# Patient Record
Sex: Male | Born: 1935 | Race: White | Hispanic: No | Marital: Married | Smoking: Former smoker
Health system: Southern US, Community
[De-identification: ages and names within clinical notes are randomized; demographics above are authoritative.]

## PROBLEM LIST (undated history)

## (undated) DIAGNOSIS — G629 Polyneuropathy, unspecified: Secondary | ICD-10-CM

## (undated) DIAGNOSIS — I2510 Atherosclerotic heart disease of native coronary artery without angina pectoris: Secondary | ICD-10-CM

## (undated) DIAGNOSIS — Z9289 Personal history of other medical treatment: Secondary | ICD-10-CM

## (undated) DIAGNOSIS — E785 Hyperlipidemia, unspecified: Secondary | ICD-10-CM

## (undated) DIAGNOSIS — I6529 Occlusion and stenosis of unspecified carotid artery: Secondary | ICD-10-CM

## (undated) DIAGNOSIS — I10 Essential (primary) hypertension: Secondary | ICD-10-CM

## (undated) DIAGNOSIS — E119 Type 2 diabetes mellitus without complications: Secondary | ICD-10-CM

## (undated) HISTORY — DX: Hyperlipidemia, unspecified: E78.5

## (undated) HISTORY — DX: Polyneuropathy, unspecified: G62.9

## (undated) HISTORY — DX: Atherosclerotic heart disease of native coronary artery without angina pectoris: I25.10

## (undated) HISTORY — DX: Type 2 diabetes mellitus without complications: E11.9

## (undated) HISTORY — DX: Essential (primary) hypertension: I10

## (undated) HISTORY — DX: Occlusion and stenosis of unspecified carotid artery: I65.29

## (undated) HISTORY — DX: Personal history of other medical treatment: Z92.89

## (undated) HISTORY — PX: TONSILLECTOMY: SUR1361

---

## 1999-03-25 ENCOUNTER — Ambulatory Visit: Admission: RE | Admit: 1999-03-25 | Discharge: 1999-03-25 | Payer: Self-pay | Admitting: Endocrinology

## 1999-06-30 ENCOUNTER — Encounter: Payer: Self-pay | Admitting: Endocrinology

## 1999-06-30 ENCOUNTER — Emergency Department (HOSPITAL_COMMUNITY): Admission: EM | Admit: 1999-06-30 | Discharge: 1999-06-30 | Payer: Self-pay | Admitting: Emergency Medicine

## 1999-07-05 ENCOUNTER — Encounter: Payer: Self-pay | Admitting: Urology

## 1999-07-05 ENCOUNTER — Ambulatory Visit (HOSPITAL_COMMUNITY): Admission: RE | Admit: 1999-07-05 | Discharge: 1999-07-05 | Payer: Self-pay | Admitting: Urology

## 2005-04-25 ENCOUNTER — Emergency Department (HOSPITAL_COMMUNITY): Admission: EM | Admit: 2005-04-25 | Discharge: 2005-04-25 | Payer: Self-pay | Admitting: Family Medicine

## 2005-04-25 ENCOUNTER — Ambulatory Visit (HOSPITAL_COMMUNITY): Admission: RE | Admit: 2005-04-25 | Discharge: 2005-04-25 | Payer: Self-pay | Admitting: Family Medicine

## 2007-09-03 ENCOUNTER — Inpatient Hospital Stay (HOSPITAL_COMMUNITY): Admission: EM | Admit: 2007-09-03 | Discharge: 2007-09-04 | Payer: Self-pay | Admitting: Emergency Medicine

## 2007-09-03 ENCOUNTER — Ambulatory Visit: Payer: Self-pay | Admitting: *Deleted

## 2007-09-09 ENCOUNTER — Ambulatory Visit: Payer: Self-pay

## 2007-09-13 ENCOUNTER — Ambulatory Visit: Payer: Self-pay | Admitting: Cardiology

## 2007-09-13 DIAGNOSIS — R911 Solitary pulmonary nodule: Secondary | ICD-10-CM

## 2007-09-13 DIAGNOSIS — J984 Other disorders of lung: Secondary | ICD-10-CM

## 2007-09-13 HISTORY — DX: Solitary pulmonary nodule: R91.1

## 2007-09-15 ENCOUNTER — Ambulatory Visit: Payer: Self-pay | Admitting: Cardiology

## 2007-09-15 LAB — CONVERTED CEMR LAB
Basophils Relative: 1.1 % — ABNORMAL HIGH (ref 0.0–1.0)
Chloride: 105 meq/L (ref 96–112)
Eosinophils Relative: 1.6 % (ref 0.0–5.0)
GFR calc Af Amer: 95 mL/min
Glucose, Bld: 101 mg/dL — ABNORMAL HIGH (ref 70–99)
MCHC: 34 g/dL (ref 30.0–36.0)
Monocytes Relative: 11.7 % — ABNORMAL HIGH (ref 3.0–11.0)
Neutro Abs: 2.4 10*3/uL (ref 1.4–7.7)
Potassium: 3.9 meq/L (ref 3.5–5.1)
RDW: 12.7 % (ref 11.5–14.6)
WBC: 4.3 10*3/uL — ABNORMAL LOW (ref 4.5–10.5)
aPTT: 27.9 s (ref 21.7–29.8)

## 2007-09-20 ENCOUNTER — Inpatient Hospital Stay (HOSPITAL_COMMUNITY): Admission: EM | Admit: 2007-09-20 | Discharge: 2007-09-21 | Payer: Self-pay | Admitting: Cardiology

## 2007-09-20 ENCOUNTER — Ambulatory Visit: Payer: Self-pay | Admitting: Cardiology

## 2007-09-20 ENCOUNTER — Inpatient Hospital Stay (HOSPITAL_BASED_OUTPATIENT_CLINIC_OR_DEPARTMENT_OTHER): Admission: RE | Admit: 2007-09-20 | Discharge: 2007-09-20 | Payer: Self-pay | Admitting: Cardiology

## 2007-10-01 ENCOUNTER — Ambulatory Visit: Payer: Self-pay | Admitting: Cardiology

## 2007-10-01 LAB — CONVERTED CEMR LAB
BUN: 17 mg/dL (ref 6–23)
Basophils Absolute: 0 10*3/uL (ref 0.0–0.1)
CO2: 25 meq/L (ref 19–32)
Calcium: 9.2 mg/dL (ref 8.4–10.5)
Creatinine, Ser: 1.1 mg/dL (ref 0.4–1.5)
GFR calc Af Amer: 85 mL/min
HCT: 39.7 % (ref 39.0–52.0)
Hemoglobin: 13.2 g/dL (ref 13.0–17.0)
Lymphocytes Relative: 25.6 % (ref 12.0–46.0)
MCV: 96 fL (ref 78.0–100.0)
Monocytes Relative: 10.6 % (ref 3.0–12.0)
Neutro Abs: 2.3 10*3/uL (ref 1.4–7.7)
Potassium: 3.9 meq/L (ref 3.5–5.1)
RBC: 4.13 M/uL — ABNORMAL LOW (ref 4.22–5.81)
RDW: 13.1 % (ref 11.5–14.6)

## 2007-10-06 DIAGNOSIS — I10 Essential (primary) hypertension: Secondary | ICD-10-CM

## 2007-10-06 DIAGNOSIS — I219 Acute myocardial infarction, unspecified: Secondary | ICD-10-CM | POA: Insufficient documentation

## 2007-10-06 HISTORY — DX: Acute myocardial infarction, unspecified: I21.9

## 2007-10-07 ENCOUNTER — Ambulatory Visit: Payer: Self-pay | Admitting: Internal Medicine

## 2007-10-07 ENCOUNTER — Encounter (HOSPITAL_COMMUNITY): Admission: RE | Admit: 2007-10-07 | Discharge: 2008-01-05 | Payer: Self-pay | Admitting: Cardiology

## 2007-11-18 ENCOUNTER — Ambulatory Visit: Payer: Self-pay | Admitting: Cardiology

## 2007-11-18 LAB — CONVERTED CEMR LAB
BUN: 23 mg/dL (ref 6–23)
CK-MB: 2.7 ng/mL (ref 0.3–4.0)
Eosinophils Absolute: 0.1 10*3/uL (ref 0.0–0.7)
Eosinophils Relative: 1.9 % (ref 0.0–5.0)
GFR calc Af Amer: 95 mL/min
Glucose, Bld: 112 mg/dL — ABNORMAL HIGH (ref 70–99)
Lymphocytes Relative: 25.6 % (ref 12.0–46.0)
Magnesium: 2 mg/dL (ref 1.5–2.5)
Monocytes Relative: 11.1 % (ref 3.0–12.0)
Neutro Abs: 2.6 10*3/uL (ref 1.4–7.7)
Neutrophils Relative %: 61 % (ref 43.0–77.0)

## 2007-11-25 ENCOUNTER — Ambulatory Visit: Payer: Self-pay

## 2007-11-25 ENCOUNTER — Encounter: Payer: Self-pay | Admitting: Internal Medicine

## 2007-12-01 ENCOUNTER — Ambulatory Visit: Payer: Self-pay | Admitting: Cardiology

## 2008-02-23 ENCOUNTER — Ambulatory Visit: Payer: Self-pay | Admitting: Cardiology

## 2008-03-06 ENCOUNTER — Telehealth: Payer: Self-pay | Admitting: Internal Medicine

## 2008-03-06 ENCOUNTER — Ambulatory Visit: Payer: Self-pay | Admitting: Cardiology

## 2008-08-24 ENCOUNTER — Ambulatory Visit: Payer: Self-pay | Admitting: Cardiology

## 2008-08-24 DIAGNOSIS — I10 Essential (primary) hypertension: Secondary | ICD-10-CM | POA: Insufficient documentation

## 2008-08-24 DIAGNOSIS — I2510 Atherosclerotic heart disease of native coronary artery without angina pectoris: Secondary | ICD-10-CM

## 2008-08-24 DIAGNOSIS — E782 Mixed hyperlipidemia: Secondary | ICD-10-CM

## 2008-08-24 DIAGNOSIS — I495 Sick sinus syndrome: Secondary | ICD-10-CM

## 2008-08-24 DIAGNOSIS — E785 Hyperlipidemia, unspecified: Secondary | ICD-10-CM | POA: Insufficient documentation

## 2008-08-24 HISTORY — DX: Sick sinus syndrome: I49.5

## 2008-08-24 HISTORY — DX: Essential (primary) hypertension: I10

## 2008-08-24 HISTORY — DX: Mixed hyperlipidemia: E78.2

## 2008-08-24 HISTORY — DX: Atherosclerotic heart disease of native coronary artery without angina pectoris: I25.10

## 2008-12-20 ENCOUNTER — Ambulatory Visit: Payer: Self-pay | Admitting: Cardiology

## 2008-12-28 ENCOUNTER — Telehealth: Payer: Self-pay | Admitting: Internal Medicine

## 2009-08-20 ENCOUNTER — Ambulatory Visit: Payer: Self-pay | Admitting: Cardiology

## 2009-08-21 ENCOUNTER — Telehealth (INDEPENDENT_AMBULATORY_CARE_PROVIDER_SITE_OTHER): Payer: Self-pay | Admitting: *Deleted

## 2009-09-03 ENCOUNTER — Telehealth: Payer: Self-pay | Admitting: Internal Medicine

## 2009-09-05 ENCOUNTER — Encounter: Admission: RE | Admit: 2009-09-05 | Discharge: 2009-09-05 | Payer: Self-pay | Admitting: Orthopedic Surgery

## 2009-09-07 ENCOUNTER — Ambulatory Visit: Payer: Self-pay | Admitting: Cardiology

## 2009-09-14 ENCOUNTER — Telehealth (INDEPENDENT_AMBULATORY_CARE_PROVIDER_SITE_OTHER): Payer: Self-pay | Admitting: *Deleted

## 2009-09-27 ENCOUNTER — Ambulatory Visit: Payer: Self-pay | Admitting: Internal Medicine

## 2009-09-27 DIAGNOSIS — R0683 Snoring: Secondary | ICD-10-CM

## 2009-09-27 DIAGNOSIS — R0989 Other specified symptoms and signs involving the circulatory and respiratory systems: Secondary | ICD-10-CM

## 2009-09-27 DIAGNOSIS — R0609 Other forms of dyspnea: Secondary | ICD-10-CM

## 2009-09-27 HISTORY — DX: Snoring: R06.83

## 2009-12-04 ENCOUNTER — Encounter: Admission: RE | Admit: 2009-12-04 | Discharge: 2009-12-04 | Payer: Self-pay | Admitting: Endocrinology

## 2010-07-29 ENCOUNTER — Encounter: Payer: Self-pay | Admitting: Internal Medicine

## 2010-08-08 ENCOUNTER — Encounter: Payer: Self-pay | Admitting: Cardiovascular Disease

## 2010-08-08 ENCOUNTER — Ambulatory Visit (INDEPENDENT_AMBULATORY_CARE_PROVIDER_SITE_OTHER): Payer: BC Managed Care – PPO | Admitting: Cardiovascular Disease

## 2010-08-08 ENCOUNTER — Ambulatory Visit: Admit: 2010-08-08 | Payer: Self-pay | Admitting: Cardiovascular Disease

## 2010-08-08 DIAGNOSIS — I10 Essential (primary) hypertension: Secondary | ICD-10-CM

## 2010-08-08 DIAGNOSIS — R5383 Other fatigue: Secondary | ICD-10-CM | POA: Insufficient documentation

## 2010-08-08 DIAGNOSIS — I2510 Atherosclerotic heart disease of native coronary artery without angina pectoris: Secondary | ICD-10-CM

## 2010-08-08 DIAGNOSIS — R5381 Other malaise: Secondary | ICD-10-CM | POA: Insufficient documentation

## 2010-08-08 HISTORY — DX: Other fatigue: R53.83

## 2010-08-08 NOTE — Progress Notes (Signed)
Summary: CT this week  Phone Note Call from Patient Call back at Home Phone [phone]   Caller: Patient Call For: Christianne Zacher Summary of Call: pt states that he has a CT scheduled for this fri. wants to make sure that someone has gotten this pre approved before fri.  Initial call taken by: Tivis Ringer, CNA,  September 03, 2009 3:52 PM  Follow-up for Phone Call        according to [facility] note not pre auth needed. Pt aware.Carron Curie CMA  September 03, 2009 4:27 PM     -

## 2010-08-08 NOTE — Progress Notes (Signed)
Summary: results  Phone Note Call from Patient Call back at Home Phone [phone]   Caller: Patient Call For: ramaswamy Reason for Call: Talk to Nurse Summary of Call: pt looking forward to a call for CT results. Initial call taken by: Eugene Gavia,  September 14, 2009 9:50 AM  Follow-up for Phone Call        please advise on results. thanks. Carron Curie CMA  September 14, 2009 11:37 AM   Additional Follow-up for Phone Call Additional follow up Details #1::        The plan was he come and see me. I have not seen him in 2 years but agreed that would follow the surveillance scan and update results over phone. the understanding was that he come after this final current CT scan. The nodule is unchanged and does not need followup but he absolutely needs to come for a followup and wrap things up Additional Follow-up by: Kalman Shan MD,  September 16, 2009 10:39 PM    Additional Follow-up for Phone Call Additional follow up Details #2::    LMTCB. Carron Curie CMA  September 17, 2009 11:25 AM  Spoke with pt and sched him appt with MR for 09/27/09 at 10:40 to discuss ct results.  Pt aware nodule unchanged. Follow-up by: Vernie Murders,  September 17, 2009 11:35 AM

## 2010-08-08 NOTE — Assessment & Plan Note (Signed)
Summary: F1Y  Medications Added METFORMIN HCL 500 MG  TB24 (METFORMIN HCL) 2 tabs two times a day NIASPAN 500 MG  TBCR (NIACIN (ANTIHYPERLIPIDEMIC)) 2 tab once daily POTASSIUM CHLORIDE CRYS CR 20 MEQ CR-TABS (POTASSIUM CHLORIDE CRYS CR) Take one tablet by mouth daily LANTUS 100 UNIT/ML SOLN (INSULIN GLARGINE) 16 units (08-20-2009) BENICAR 40 MG TABS (OLMESARTAN MEDOXOMIL) Take one tablet by mouth daily ASPIRIN EC 325 MG TBEC (ASPIRIN) Take one tablet by mouth daily DIPHENOXYLATE-ATROPINE 2.5-0.025 MG TABS (DIPHENOXYLATE-ATROPINE) 1 tab as needed      Allergies Added:   Visit Type:  Follow-up Referring Provider:  Dr. Charlies Constable Primary Provider:  Dr. Corrin Parker  CC:  no cardaic complaints.  History of Present Illness: The patient is 75 years old and return for management of CAD. No stent placed to the right coronary artery in March of 2009. He also has chronotropic incompetence with a heart rate that goes only to 87 with exercise. This was on verapamil. We discontinue the verapamil but he and we put him back on low-dose verapamil.    he has been doing well over the past year.  he has had no chest pain shortness of breath or palpitations.  His exercise times is limited primarily by right hip pain.  His other problems include diabetes hypertension hyperlipidemia. He also has a pulmonary nodule that is being followed with serial CT scans and has been stable.  Current Medications (verified): 1)  Metformin Hcl 500 Mg  Tb24 (Metformin Hcl) .... 2 Tabs Two Times A Day 2)  Crestor 20 Mg  Tabs (Rosuvastatin Calcium) .... Once Daily 3)  Allopurinol 300 Mg  Tabs (Allopurinol) .... Once Daily 4)  Triamterene-Hctz 37.5-25 Mg  Caps (Triamterene-Hctz) .... 1/2 Tab Daily 5)  Actos 45 Mg  Tabs (Pioglitazone Hcl) .... Once Daily 6)  Januvia 100 Mg  Tabs (Sitagliptin Phosphate) .... Once Daily 7)  Niaspan 500 Mg  Tbcr (Niacin (Antihyperlipidemic)) .... 2 Tab Once Daily 8)  Celexa 40 Mg  Tabs  (Citalopram Hydrobromide) .... 1/2-1 Daily 9)  Vitamin B-12 Cr 1000 Mcg  Tbcr (Cyanocobalamin) .... Once Daily 10)  Diazepam 5 Mg  Tabs (Diazepam) .... At Bedtime As Needed 11)  Ambien 10 Mg  Tabs (Zolpidem Tartrate) .... At Bedtime As Needed 12)  Nitroglycerin 0.4 Mg Subl (Nitroglycerin) .... Take 1 Tablet As Needed For Chest Pain 13)  Verapamil Hcl Cr 120 Mg Xr24h-Cap (Verapamil Hcl) .... Take One Capsule Two Times A Day 14)  Potassium Chloride Crys Cr 20 Meq Cr-Tabs (Potassium Chloride Crys Cr) .... Take One Tablet By Mouth Daily 15)  Lantus 100 Unit/ml Soln (Insulin Glargine) .Marland Kitchen.. 16 Units (08-20-2009) 16)  Benicar 40 Mg Tabs (Olmesartan Medoxomil) .... Take One Tablet By Mouth Daily 17)  Aspirin Ec 325 Mg Tbec (Aspirin) .... Take One Tablet By Mouth Daily 18)  Diphenoxylate-Atropine 2.5-0.025 Mg Tabs (Diphenoxylate-Atropine) .Marland Kitchen.. 1 Tab As Needed  Allergies (verified): 1)  ! Sulfa 2)  ! Penicillin 3)  ! Heparin  Past History:  Past Medical History: Reviewed history from 08/24/2008 and no changes required. Hypertension CAD - sp stent 09/2007.  DM Spont Left Pneumothorax 1959. Treat by chest tube.  Thinks he might have partial recurrence Denies COPD, Asthma 1. Coronary artery disease status post percutaneous coronary     intervention of the mid-right coronary artery using a bare-metal     stent, March 2009. 2. History of remote mitral valve prolapse. 3. 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 4. A 6-cm nodule by  CT scan. 5. Diabetes. 6. Hypertension. 7. Hyperlipidemia.   Review of Systems       ROS is negative except as outlined in HPI.   Vital Signs:  Patient profile:   75 year old male Height:      74 inches Weight:      208 pounds BMI:     26.80 Pulse rate:   63 / minute BP sitting:   136 / 72  (left arm) Cuff size:   regular  Vitals Entered By: Burnett Kanaris, CNA (August 20, 2009 9:13 AM)  Physical Exam  Additional Exam:  Gen. Well-nourished, in no distress    Neck: No JVD, thyroid not enlarged, no carotid bruits Lungs: No tachypnea, clear without rales, rhonchi or wheezes Cardiovascular: Rhythm regular, PMI not displaced,  heart sounds  normal, no murmurs or gallops, no peripheral edema, pulses normal in all 4 extremities. Abdomen: BS normal, abdomen soft and non-tender without masses or organomegaly, no hepatosplenomegaly. MS: No deformities, no cyanosis or clubbing   Neuro:  No focal sns   Skin:  no lesions    Impression & Recommendations:  Problem # 1:  CAD, NATIVE VESSEL (ICD-414.01) He had a bare-metal stent to the right coronary artery in 2009. He has had no chest pain and this problem appears stable. The following medications were removed from the medication list:    Adprin B 325 Mg Tabs (Aspirin buf(cacarb-mgcarb-mgo)) ..... Once daily    Plavix 75 Mg Tabs (Clopidogrel bisulfate) ..... Once daily His updated medication list for this problem includes:    Nitroglycerin 0.4 Mg Subl (Nitroglycerin) .Marland Kitchen... Take 1 tablet as needed for chest pain    Verapamil Hcl Cr 120 Mg Xr24h-cap (Verapamil hcl) .Marland Kitchen... Take one capsule two times a day    Aspirin Ec 325 Mg Tbec (Aspirin) .Marland Kitchen... Take one tablet by mouth daily  Orders: EKG w/ Interpretation (93000)  Problem # 2:  HYPERTENSION, BENIGN (ICD-401.1) This is well controlled on current medications. The following medications were removed from the medication list:    Adprin B 325 Mg Tabs (Aspirin buf(cacarb-mgcarb-mgo)) ..... Once daily His updated medication list for this problem includes:    Triamterene-hctz 37.5-25 Mg Caps (Triamterene-hctz) .Marland Kitchen... 1/2 tab daily    Verapamil Hcl Cr 120 Mg Xr24h-cap (Verapamil hcl) .Marland Kitchen... Take one capsule two times a day    Benicar 40 Mg Tabs (Olmesartan medoxomil) .Marland Kitchen... Take one tablet by mouth daily    Aspirin Ec 325 Mg Tbec (Aspirin) .Marland Kitchen... Take one tablet by mouth daily  Problem # 3:  HYPERLIPIDEMIA-MIXED (ICD-272.4) He had a recent lipid profile with his  primary care physician. He will get these results to [place]. He indicated that his HDL was up at 48. His updated medication list for this problem includes:    Crestor 20 Mg Tabs (Rosuvastatin calcium) ..... Once daily    Niaspan 500 Mg Tbcr (Niacin (antihyperlipidemic)) .Marland Kitchen... 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 His exercise times is limited mainly by arthritic pain in his hip. We will not change his treatment. The following medications were removed from the medication list:    Adprin B 325 Mg Tabs (Aspirin buf(cacarb-mgcarb-mgo)) ..... Once daily    Plavix 75 Mg Tabs (Clopidogrel bisulfate) ..... Once daily His updated medication list for this problem includes:    Nitroglycerin 0.4 Mg Subl (Nitroglycerin) .Marland Kitchen... Take 1 tablet as needed for chest pain  Verapamil Hcl Cr 120 Mg Xr24h-cap (Verapamil hcl) .Marland Kitchen... Take one capsule two times a day    Aspirin Ec 325 Mg Tbec (Aspirin) .Marland Kitchen... Take one tablet by mouth daily  Patient Instructions: 1)  Your physician wants you to follow-up in: 1 year with Dr. Earney Hamburg.   You will receive a reminder letter in the mail two months in advance. If you don't receive a letter, please call our office to schedule the follow-up appointment.

## 2010-08-08 NOTE — Progress Notes (Signed)
Summary: REFILL done   Phone Note Refill Request Message from:  Pharmacy on August 21, 2009 11:16 AM  Refills Requested: Medication #1:  DIPHENOXYLATE-ATROPINE 2.5-0.025 MG TABS 1 tab as needed.  Medication #2:  NITROGLYCERIN 0.4 MG SUBL take 1 tablet as needed for chest pain  Medication #3:  AMBIEN 10 MG  TABS at bedtime as needed [place] [phone]  Initial call taken by: Judie Grieve,  August 21, 2009 11:17 AM    Prescriptions: NITROGLYCERIN 0.4 MG SUBL (NITROGLYCERIN) take 1 tablet as needed for chest pain  #25 x 4   Entered by:   Burnett Kanaris, CNA   Authorized by:   Lenoria Farrier, MD, [facility]   Signed by:   Burnett Kanaris, CNA on 08/22/2009   Method used:   Electronically to        [facility]* (retail)       [address]       [place], [place]  [id number]       Ph: [phone]       Fax: [phone]   RxID:   [id number]

## 2010-08-08 NOTE — Assessment & Plan Note (Signed)
Summary: followup/discuss recent ct//lmr   Visit Type:  Follow-up Copy to:  Dr. Charlies Constable Primary Provider/Referring Provider:  Dr. Corrin Parker  CC:  Pt here for follow-up to discuss CT scan. [name]  History of Present Illness: OV 09/27/2009:  75 year old male. Presented with chest pain to [facility] ER on 09/03/2007. Had coronary stent 2 weeks later. Initial eval at ER showed "spot" in CXR. Was followed up with CT chest 09/13/2007. showd 6mm RUL anterior segment subpleural nodule. Therefore, was referred here.  He saw me initially in April 2009 after that we kept in touch over phone and did serial CT chest. He had CT chest on 02/17/2008, 12/20/2008 and most recently on 3//2011. All scans show continued stability of nodule and no new findingds. He is now here for 2 year followup.  Feels fine overall. In past 2 years has developed some chronic low back pain and some excess day time somnolence. Admits to falling asleep easily when reading book, or watching TV or being pasenger in a car or sometimes even while driving. He is working on cutting down his nocturnal benzodiazepine intake. If this fails, he plans to see Dr. Maple Hudson in our office. Otherwise, denies cough, dyspnea, hemoptysis, weight loss, pnd, orthopnea.  Current Medications (verified): 1)  Metformin Hcl 500 Mg  Tb24 (Metformin Hcl) .... 2 Tabs Two Times A Day 2)  Crestor 20 Mg  Tabs (Rosuvastatin Calcium) .... Once Daily 3)  Allopurinol 300 Mg  Tabs (Allopurinol) .... Once Daily 4)  Triamterene-Hctz 37.5-25 Mg  Caps (Triamterene-Hctz) .... 1/2 Tab Daily 5)  Actos 45 Mg  Tabs (Pioglitazone Hcl) .... Once Daily 6)  Januvia 100 Mg  Tabs (Sitagliptin Phosphate) .... Once Daily 7)  Niaspan 500 Mg  Tbcr (Niacin (Antihyperlipidemic)) .... 2 Tab Once Daily 8)  Celexa 40 Mg  Tabs (Citalopram Hydrobromide) .... 1/2-1 Daily 9)  Vitamin B-12 Cr 1000 Mcg  Tbcr (Cyanocobalamin) .... Once Daily 10)  Diazepam 5 Mg  Tabs (Diazepam) .... At Bedtime As Needed 11)   Ambien 10 Mg  Tabs (Zolpidem Tartrate) .... At Bedtime As Needed 12)  Nitroglycerin 0.4 Mg Subl (Nitroglycerin) .... Take 1 Tablet As Needed For Chest Pain 13)  Verapamil Hcl Cr 120 Mg Xr24h-Cap (Verapamil Hcl) .... Take One Capsule Two Times A Day 14)  Potassium Chloride Crys Cr 20 Meq Cr-Tabs (Potassium Chloride Crys Cr) .... Take One Tablet By Mouth Daily 15)  Lantus 100 Unit/ml Soln (Insulin Glargine) .[name].. 16 Units (08-20-2009) 16)  Benicar 40 Mg Tabs (Olmesartan Medoxomil) .... Take One Tablet By Mouth Daily 17)  Aspirin Ec 325 Mg Tbec (Aspirin) .... Take One Tablet By Mouth Daily 18)  Diphenoxylate-Atropine 2.5-0.025 Mg Tabs (Diphenoxylate-Atropine) .[name].. 1 Tab As Needed  Allergies (verified): 1)  ! Sulfa 2)  ! Penicillin 3)  ! 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  Lives in [place] Travelled a lot duirng his working life to [place], [place], [place], [place], [place]. Has built a factory in [place]  Risk Factors: Alcohol Use: 1 (10/07/2007)  Risk Factors: Smoking Status: quit (10/07/2007)  Past Medical History: Reviewed history from 08/24/2008 and no changes required. Hypertension CAD - sp stent 09/2007.  DM Spont Left Pneumothorax 1959. Treat by chest tube.  Thinks he might have partial recurrence  Denies COPD, Asthma 1. Coronary artery disease status post percutaneous coronary     intervention of the mid-right coronary artery using a bare-metal     stent, March 2009. 2. History of remote mitral valve prolapse. 3. 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 4. A 6-cm nodule by CT scan. 5. Diabetes. 6. Hypertension. 7. Hyperlipidemia.   Past Surgical History: Reviewed history from 10/07/2007 and no changes required. heart stent surgery -  09/2007  Family History: Reviewed history from 10/07/2007 and no changes required. disease-heart ---father  Social History: Reviewed history from 10/07/2007 and no changes required. 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  Lives in [place] Travelled a lot duirng his working life to [place], [place], [place], [place], [place]. 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    Vital Signs:  Patient profile:   75 year old male Height:      74 inches Weight:      212.13 pounds O2 Sat:      95 % on Room air Temp:     97.5 degrees F oral Pulse rate:   65 / minute BP sitting:   128 / 82  (right arm) Cuff size:   regular  Vitals Entered By: Carron Curie CMA (September 27, 2009 11:01 AM)  O2 Flow:  Room air  Physical Exam  General:  well developed, well nourished, in no acute distress Head:  normocephalic and atraumatic Eyes:  PERRLA/EOM intact; conjunctiva and sclera clear Ears:  TMs intact and clear with normal canals Nose:  no deformity, discharge, inflammation, or lesions Mouth:  no deformity or lesions Mallampatti class 2-3 4 fb long neck between mentum and thyroid Neck:  no masses, thyromegaly, or abnormal cervical nodes Chest Wall:  no deformities noted Lungs:  clear bilaterally to auscultation and percussion Heart:  regular rate and rhythm, S1, S2 without murmurs, rubs,  gallops, or clicks Abdomen:  bowel sounds positive; abdomen soft and non-tender without masses, or organomegaly Msk:  no deformity or scoliosis noted with normal posture Pulses:  pulses normal Extremities:  no clubbing, cyanosis, edema, or deformity noted Neurologic:  CN II-XII grossly intact with normal reflexes, coordination, muscle strength and tone Skin:  intact without lesions or rashes Cervical Nodes:  no significant adenopathy Psych:  alert and cooperative; normal mood and affect; normal attention span and concentration   CT of Chest  Procedure date:  09/07/2009  Findings:      09/13/2007. showd 6mm RUL anterior segment subpleural nodule. Therefore, was referred here.  He saw me initially in April 2009 after that we kept in touch over phone and did serial CT chest. He had CT chest on 02/17/2008, 12/20/2008 and most recently on 3//2011. All scans show continued stability of nodule and no new findingds.   Comments:      independenty reviewed all films  Impression & Recommendations:  Problem # 1:  PULMONARY NODULE, RIGHT UPPER LOBE (ICD-518.89) Assessment Unchanged  6mm incidental nodule in RUL anterior segment, subpleural region noted  09/2007 in former smoker but also someone who has travelled heavily to states rich in soil fungii.   Serial  CT chest on 02/17/2008, 12/20/2008 and most recently on 3//2011. All scans show continued stability of nodule and no new findingds. He is now here for 2 year followup.;  PLAN Noi further followup REassure  Orders: Est. Patient Level III (60454)  Problem # 2:  SNORING (ICD-786.09) Assessment: New  Probably has Sleep apnea. He will work with Dr. Dagoberto Ligas on cutting down his benzo intake. When he feels ready, he will go see Dr Maple Hudson His updated medication list for this problem includes:    Triamterene-hctz 37.5-25 Mg Caps (Triamterene-hctz) .[name]... 1/2 tab daily  Orders: Est. Patient Level III (09811)  Patient Instructions: 1)  Your lung  nodule has remained stable over 2 years 2)  No need to followu this further 3)  When you are ready to see Dr. Maple Hudson for sleep issues, please call our office 4)  Please come back anytime you have any new pulmonary issues   Immunization History:  Influenza Immunization History:    Influenza:  fluvax 3+ (03/07/2009)  Pneumovax Immunization History:    Pneumovax:  pneumovax (09/07/2006)

## 2010-08-14 NOTE — Assessment & Plan Note (Signed)
Summary: f 1y per checkout 08/20/09 former Juanda Chance pt/ Hm per pt call / rm   Vital Signs:  Patient profile:   75 year old male Height:      74 inches Weight:      223.75 pounds BMI:     28.83 Pulse rate:   60 / minute BP sitting:   146 / 72  (left arm)  Vitals Entered By: Celestia Khat, CMA (August 08, 2010 10:31 AM)  Visit Type:  follow-up 1 year Referring Provider:  Dr. Charlies Constable Primary Provider:  Dr. Corrin Parker  CC:  pt has no complaints today.Marland Kitchen  History of Present Illness: 75 yo WM with h/o CAD s/p RCA stent 2009 with moderate disease in the LAD, DM, HTN, hyperlipidemia here for routine cardiac follow up. He has been followed in the past by Dr. Juanda Chance.  He also has chronotropic incompetence with a heart rate that goes only to 87 with exercise. This was on verapamil. His verapamil has been stopped in the past but restarted at a lower dose. He has been doing well over the past year.  He has had no chest pain shortness of breath or palpitations. No dizziness, near syncope or syncope. He has not been exercising. He does describe daytime somnolence. He has had a sleep study in the past. This was normal. He is not aware of any thyroid issues in the past. He also tells me that he has a cyst on his spine with possible need for surgery in the next year.   Current Medications (verified): 1)  Glumetza 500 Mg Xr24h-Tab (Metformin Hcl) .... Take 2 Tablets in The Evening 2)  Crestor 20 Mg  Tabs (Rosuvastatin Calcium) .... Once Daily 3)  Allopurinol 300 Mg  Tabs (Allopurinol) .... Once Daily 4)  Triamterene-Hctz 37.5-25 Mg  Caps (Triamterene-Hctz) .... 1/2 Tab Daily 5)  Actos 45 Mg  Tabs (Pioglitazone Hcl) .... Once Daily 6)  Januvia 100 Mg  Tabs (Sitagliptin Phosphate) .... Once Daily 7)  Niaspan 500 Mg  Tbcr (Niacin (Antihyperlipidemic)) .... 2 Tab Once Daily 8)  Celexa 40 Mg  Tabs (Citalopram Hydrobromide) .... 1/2-1 Daily 9)  Vitamin B-12 Cr 1000 Mcg  Tbcr (Cyanocobalamin) .... Once  Daily 10)  Diazepam 5 Mg  Tabs (Diazepam) .... At Bedtime As Needed 11)  Ambien 10 Mg  Tabs (Zolpidem Tartrate) .... At Bedtime As Needed 12)  Nitroglycerin 0.4 Mg Subl (Nitroglycerin) .... Take 1 Tablet As Needed For Chest Pain 13)  Verapamil Hcl Cr 120 Mg Xr24h-Cap (Verapamil Hcl) .... Take One Capsule Two Times A Day 14)  Potassium Chloride Crys Cr 20 Meq Cr-Tabs (Potassium Chloride Crys Cr) .... Take One Tablet By Mouth Daily 15)  Lantus 100 Unit/ml Soln (Insulin Glargine) .Marland Kitchen.. 16 Units (08-20-2009) 16)  Benicar 40 Mg Tabs (Olmesartan Medoxomil) .... Take One Tablet By Mouth Daily 17)  Aspirin Ec 325 Mg Tbec (Aspirin) .... Take One Tablet By Mouth Daily 18)  Diphenoxylate-Atropine 2.5-0.025 Mg Tabs (Diphenoxylate-Atropine) .Marland Kitchen.. 1 Tab As Needed 19)  Potassium Chloride Crys Cr 20 Meq Cr-Tabs (Potassium Chloride Crys Cr) .... 2 Tablets Once Daily 20)  Nabumetone 500 Mg Tabs (Nabumetone) .Marland Kitchen.. 1 Tab in Am and 1 Tab in Pm  Allergies (verified): 1)  ! Sulfa 2)  ! Penicillin 3)  ! Heparin  Past History:  Past Medical History: 1. Coronary artery disease status post percutaneous coronary     intervention of the mid-right coronary artery using a bare-metal     stent,  March 2009. 2. History of remote mitral valve prolapse. 3. 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 4. A 6-cm nodule by CT scan right lung 5. Diabetes. 6. Hypertension. 7. Hyperlipidemia.  8. Spontaneous Left Pneumothorax 1959. Treat by chest tube.  Thinks he might have partial recurrence      Denies COPD, Asthma  Past Surgical History: Cardiac cath 3/09: Bare metal stent mid RCA, 50-70% LAD stenosis.  Left chest tube  Family History: Reviewed history from 10/07/2007 and no changes required. Father-deceased, CAD, MI died age 29 Mother-deceased, dementia, CVA Brother-older, alive, no CAD. Prostate cancer  Social History: Reviewed history from 10/07/2007 and no changes required. 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 cutive.  Lives in [place] Travelled a lot duirng his working life to [place], [place], [place], [place], [place]. Has built a factory in [place]  Review of Systems  The patient denies fatigue, malaise, fever, weight gain/loss, vision loss, decreased hearing, hoarseness, chest pain, palpitations, shortness of breath, prolonged cough, wheezing, sleep apnea, coughing up blood, abdominal pain, blood in stool, nausea, vomiting, diarrhea, heartburn, incontinence, blood in urine, muscle weakness, joint pain, leg swelling, rash, skin lesions, headache, fainting, dizziness, depression, anxiety, enlarged lymph nodes, easy bruising or bleeding, and environmental allergies.    Physical Exam  General:  General: Well developed, well nourished, NAD HEENT: OP clear, mucus membranes moist SKIN: warm, dry Neuro: No focal deficits Musculoskeletal: Muscle strength 5/5 all ext Psychiatric: Mood and affect normal Neck: No JVD, no carotid bruits, no thyromegaly, no lymphadenopathy. Lungs:Clear bilaterally, no wheezes, rhonci, crackles CV: RRR no murmurs, gallops rubs Abdomen: soft, NT, ND, BS present Extremities: No edema, pulses 2+.    Impression & Recommendations:  Problem # 1:  CAD, NATIVE VESSEL (ICD-414.01) Stable. Bare metal stent RCA in March 2009. Continue ASA.   His updated medication list for this problem includes:    Nitroglycerin 0.4 Mg Subl (Nitroglycerin) .Marland Kitchen... Take 1 tablet as needed for chest pain    Verapamil Hcl Cr 120 Mg Xr24h-cap (Verapamil hcl) .Marland Kitchen... Take one capsule two times a day    Aspirin Ec 325 Mg Tbec (Aspirin) .Marland Kitchen... Take one tablet by mouth daily  Orders: Echocardiogram (Echo)  Problem # 2:  HYPERTENSION, BENIGN (ICD-401.1) He has not been taking his Maxzide. He will resume this. He will stop the [place].  BP has been well controlled over the past  year.   His updated medication list for this problem includes:    Triamterene-hctz 37.5-25 Mg Caps (Triamterene-hctz) .Marland Kitchen... 1/2 tab daily    Verapamil Hcl Cr 120 Mg Xr24h-cap (Verapamil hcl) .Marland Kitchen... Take one capsule two times a day    Benicar 40 Mg Tabs (Olmesartan medoxomil) .Marland Kitchen... Take one tablet by mouth daily    Aspirin Ec 325 Mg Tbec (Aspirin) .Marland Kitchen... Take one tablet by mouth daily  Problem # 3:  FATIGUE / MALAISE (ICD-780.79) Will repeat echo. He will need to have follow up labs in primary care.   Patient Instructions: 1)  Your physician recommends that you schedule a follow-up appointment in: 6 months 2)  Your physician recommends that you continue on your current medications as directed. Please refer to the Current Medication list given to you today. 3)  Your physician has requested that you have an echocardiogram.  Echocardiography is a painless test that uses sound waves to create images of your heart. It  provides your doctor with information about the size and shape of your heart and how well your heart's chambers and valves are working.  This procedure takes approximately one hour. There are no restrictions for this procedure.   Orders Added: 1)  Echocardiogram [Echo]

## 2010-08-19 ENCOUNTER — Other Ambulatory Visit (HOSPITAL_COMMUNITY): Payer: BC Managed Care – PPO

## 2010-08-19 ENCOUNTER — Encounter (HOSPITAL_COMMUNITY): Payer: BC Managed Care – PPO

## 2010-11-19 NOTE — Assessment & Plan Note (Signed)
[facility]                            CARDIOLOGY OFFICE NOTE   NAME:WHITFIELDJahquez, Steffler                     MRN:          [id number]  DATE:11/18/2007                            DOB:          1935/07/26    PRIMARY CARE PHYSICIAN:  Alfonse Alpers. Dagoberto Ligas, M.D.   CLINICAL HISTORY:  Mr. Mcenery is 75 years old and returned for  management of his coronary heart disease.  He has a remote history of  mitral valve prolapse.  He was recently hospitalized with chest pain and  subsequently had a Myoview scan which showed ischemia; and had a cath,  and then had intervention on the right coronary which was treated with a  large 3.5 bare metal stent, dilated to 4.0.  He had residual 50% to 70%  narrowing in the LAD.   He has done fairly well since that time, but he says he has had  persistent symptoms of fatigue with exertion, and has not gained the  energy back, that he had prior to his stent procedure.  He also has  noticed some fluttering in his chest.   PAST MEDICAL HISTORY SIGNIFICANT FOR:  1. Hypertension.  2. Diabetes.  3. Hyperlipidemia.  4. He also was found to have was found to have an abnormal chest x-ray      on CT scan which showed a 6 mm right upper lobe nodule.  He saw Dr.      Illene Bolus in consult for this, and he has recommended follow      up CT scan in August.   CURRENT MEDICATIONS INCLUDE:  1. Metformin.  2. Verapamil 240 mg b.i.d.  3. Crestor 20 mg daily.  4. K-Dur.  5. Allopurinol.  6. Triamterene.  7. Actos.  8. Januvia.  9. Niaspan.  10.Zolpidem.  11.Insulin.  12.Aspirin.  13.Plavix.  14.Vitamin B12.   PHYSICAL EXAMINATION:  On examination the blood pressure was 133/68 and  the pulse 69 and regular.  There was no venous tension.  The carotid pulses were full without  bruits.  CHEST:  Clear without rales or rhonchi.  HEART:  Rhythm was regular.  He had no murmurs or gallops.  ABDOMEN:  Soft with normal bowel sounds.  Peripheral pulses full and there is no peripheral edema.   IMPRESSION:  1. Fatigue and decreased exercise tolerance of uncertain etiology,      rule out ischemia.  2. Coronary artery disease status post stenting of the mid-right      coronary artery with a bare metal stent with a residual 50%      narrowing in the LAD.  3. Good left ventricular function.  4. History of mitral valve prolapse.  5. A 6-mm nodule right upper lobe by CT scan.  6. Diabetes.  7. Hypertension.  8. Hyperlipidemia.  9. Fatigue.   RECOMMENDATIONS:  I am uncertain regarding the etiology of Mr.  Iezzi's fatigue.  He does have a history of chronic fatigue  syndrome.  His exercise tolerance appears to somewhat reduced.  We will  plan to get and a rest stress Myoview scan to rule  out an ischemic  etiology of this.  We will get blood tests including a CBC, BMP, TSH,  and magnesium level to evaluate his palpitations, and his decreased  exercise tolerance.  We also get a hemoglobin A1c.  We will get an ECG  with a rhythm strip to see if we can see what his ectopy is.  We will  arrange follow up in 4 months but we will call him about the results of  this test sooner.     Bruce Elvera Lennox Juanda Chance, MD, [facility]  Electronically Signed    BRB/MedQ  DD: 11/18/2007  DT: 11/18/2007  Job #: [id number]   cc:   Alfonse Alpers. Dagoberto Ligas, M.D.

## 2010-11-19 NOTE — Cardiovascular Report (Signed)
[name], [name]              ACCOUNT NO.:  [id number]   MEDICAL RECORD NO.:  [id number]          PATIENT TYPE:  OIB   LOCATION:  2807                         FACILITY:  [facility]   PHYSICIAN:  Everardo Beals. Juanda Chance, MD, FACCDATE OF BIRTH:  June 02, 1936   DATE OF PROCEDURE:  09/20/2007  DATE OF DISCHARGE:                            CARDIAC CATHETERIZATION   PROCEDURE:  Percutaneous coronary intervention.   HISTORY:  [name] is 75 years old and recently was hospitalized  with chest pain.  She had a Myoview scan which showed inferior ischemia.  He was studied today in the outpatient laboratory and found to have a  tight lesion in the mid-right coronary artery.  We brought him upstairs  for intervention.   PROCEDURE:  The procedure was performed via the right femoral arteries  and arterial sheath and 6-French JR-4 guiding catheter with side holes.  The patient was given Angiomax bolus and infusion.  He was then given a  600 mg Plavix load and given four chewable aspirin downstairs.  We  crossed the lesion in the mid right coronary artery with a Prowater wire  without difficulty.  We predilated with a 3 x 15 mm Maverick performing  two inflations at 10 atmospheres for 20 seconds.  We then deployed a 3.5  x 16 mm Liberte stent deploying this with one inflation of 14  atmospheres for 30 seconds.  The lesion was located right on a bend in  the mid right coronary artery.  The bend was about 90 degrees.  Following deployment of the stent, we went in with a 4 x 12 mm [place]  Voyager.  We had a little difficulty getting this into the stent and  around the bend.  We finally accomplished this without the use of a  buddy wire.  We performed two inflations up to 14 atmospheres for 30  seconds.  Final diagnostic procedure was then informed through the  guiding catheter.  The patient tolerated the procedure well and left the  laboratory in satisfactory condition.   RESULTS:  Initially the stenosis in  the mid right coronary artery was  estimated at 95%.  Following stenting, this improved to 0%.   CONCLUSION:  Successful percutaneous coronary intervention of the mid-  right coronary lesion using a Liberte bare metal stent with improvement  in percent of narrowing from 90% to 0%.   DISPOSITION:  The patient was taken to the post anesthesia care unit for  further observation.      Bruce Elvera Lennox Juanda Chance, MD, [facility]  Electronically Signed     BRB/MEDQ  D:  09/20/2007  T:  09/20/2007  Job:  [id number]   cc:   Alfonse Alpers. Dagoberto Ligas, M.D.  Cardiopulmonary Lab

## 2010-11-19 NOTE — Discharge Summary (Signed)
[name], [name]              ACCOUNT NO.:  [id number]   MEDICAL RECORD NO.:  [id number]          PATIENT TYPE:  INP   LOCATION:  6524                         FACILITY:  [facility]   PHYSICIAN:  Donald Salvia, MD, FACCDATE OF BIRTH:  15-Jan-1936   DATE OF ADMISSION:  09/03/2007  DATE OF DISCHARGE:  09/04/2007                               DISCHARGE SUMMARY   PRIMARY CARDIOLOGIST:  Donald Beals. Donald Chance, MD, [facility]   PRIMARY CARE PHYSICIAN:  [name], M.D.   DISCHARGE DIAGNOSES:  1. Chest pain with negative cardiac enzymes x2 sets this admission.  2. Abnormal chest x-ray showing nodular density right upper lobe,      recommending nonemergent noncontrast CT of the chest and follow-up.   PAST MEDICAL HISTORY:  1. Diabetes.  2. Hypertension.  3. Depression.   HISTORY OF PRESENT ILLNESS:  [name] is a very pleasant 75-year-  old Caucasian gentleman with no known history of coronary artery disease  who presented to [place] Emergency Room  complaining of chest discomfort.  He described it as a stabbing knife-  like substernal discomfort radiating to his back without any associated  symptoms.  Pain subsided with Tylenol and aspirin, however, returned.  In the emergency department, pain was primarily relieved by  nitroglycerin.  The patient was admitted for observation, had two sets  of cardiac enzymes negative.  Chest x-ray abnormality as described  above.  The patient found to have low grade temperature of 100.6.  EKG  without acute findings.  Patient being discharged home to follow up with  physicians as stated above.  The patient has been given a copy of his  abnormal chest x-ray and understands the importance of following up.  I  have scheduled him for a stress test at our office on Monday at 12:30.  He will also need to be scheduled for a CT scan of the chest,  noncontrast.  This can be arranged outpatient.  Follow up with Dr.  Juanda Mullen, establish  care as a new patient following his stress Myoview.  Dr. Graciela Mullen has actually called Dr. Leslie Mullen, who was on-call for Dr.  Dagoberto Mullen, passed on to him the information regarding abnormal chest x-ray.   At time of discharge patient is stable, vital signs stable.  No chest  pain.   MEDICATIONS:  1. Metformin 500 b.i.d.  2. Verapamil 240 daily.  3. Crestor 20 daily.  4. Potassium 10 mEq daily.  5. Triamterene/hydrochlorothiazide 37.5/25 daily.  6. Actos 45 daily.  7. Januvia 100 daily.  8. Aspirin 325 daily.  9. Celexa 20 daily.  10.Niacin 1000 mg nightly.  11.Lantus insulin 10 units subcu daily.  12.Allopurinol 300 daily.  13.Diazepam as previously prescribed.   DURATION OF DISCHARGE ENCOUNTER:  Less than 30 minutes.      [name], ACNP      Donald Salvia, MD, [facility]  Electronically Signed    MB/MEDQ  D:  09/04/2007  T:  09/05/2007  Job:  [id number]   cc:   [name], M.D.

## 2010-11-19 NOTE — Assessment & Plan Note (Signed)
[facility]                            CARDIOLOGY OFFICE NOTE   NAME:[name], [name]                     MRN:          [id number]  DATE:08/24/2008                            DOB:          02-06-1936    PRIMARY CARE PHYSICIAN:  Donald Alpers. Dagoberto Ligas, MD   CLINICAL HISTORY:  [name] is 75 years old who returns for  followup management of his coronary heart disease.  He has a remote  history of mitral valve prolapse.  He underwent stenting of the right  coronary artery in March 2009 with a 3.5 Liberte bare-metal stent.  He  has done quite well.  Since that time, he has had no recurrent chest  pain, shortness breath, or palpitations.   He also has a chronotropic incompetence, which was documented on  treadmill testing with a peak heart rate of only 87.  However, he is on  verapamil at this time and when we switched him off of that, his  symptoms of fatigue improved, but he developed increasing symptoms of  diarrhea related to his irritable bowel syndrome.  We went back on a  lower dose of verapamil and he has done fairly well from a cardiac  standpoint; otherwise, he states he still does have some GI symptoms.   PAST MEDICAL HISTORY:  Significant for pulmonary nodule, which is  followed by Dr. Marchelle Mullen.  He had a CT scan in September 2009, which  showed no change.  Followup is recommended in a year.   He also has a history of diabetes, hypertension, and hyperlipidemia.   CURRENT MEDICATIONS:  1. Benicar 40 mg daily.  2. Verapamil 120 mg b.i.d.  3. Potassium 20 mEq daily.  4. Plavix.  5. B12.  6. Aspirin.  7. Insulin.  8. Niaspan.  9. Januvia.  10.Actos.  11.Triamterene.  12.Allopurinol.  13.Crestor.  14.Metformin.   PHYSICAL EXAMINATION:  VITAL SIGNS:  Blood pressure is 101/62 and his  pulse 66 and regular.  NECK:  There is no venous distention.  Carotid upstrokes are full  without bruits.  CHEST:  Clear.  CARDIAC:  Rhythm is  regular, and no murmurs or gallops.  ABDOMEN:  Soft without organomegaly.  EXTREMITIES:  Peripheral pulses were full with no peripheral edema.   IMPRESSION:  1. Coronary artery disease status post stenting of the mid-right      coronary artery in March 2009 with a bare-metal stent, stable.  2. History of mitral valve prolapse.  3. 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  4. A 6-mm nodule by CT.  5. Diabetes.  6. Hypertension.  7. Hyperlipidemia.  8. Mitral valve syndrome.   RECOMMENDATIONS:  I think [name] is doing quite well.  He is  scheduled for a colonoscopy with Dr. Kinnie Mullen, and I think it is fine for  him to come off of Plavix.  He had a bare-metal stent, so I think he can  come off the Plavix in a year anyway.  I would prefer that he stay on  aspirin through his colonoscopy, and we will check with Dr. Jennye Mullen  office to see if  that is okay.  We will plan to see him back in followup  in a year.  Dr. Dagoberto Mullen has been following his lipids.     Donald Elvera Lennox Juanda Chance, MD, [facility]  Electronically Signed    BRB/MedQ  DD: 08/24/2008  DT: 08/25/2008  Job #: [id number]

## 2010-11-19 NOTE — Discharge Summary (Signed)
NAMERANON, COVEN              ACCOUNT NO.:  [id number]   MEDICAL RECORD NO.:  [id number]          PATIENT TYPE:  OIB   LOCATION:  6525                         FACILITY:  [facility]   PHYSICIAN:  Bruce R. Juanda Chance, MD, FACCDATE OF BIRTH:  04/12/1936   DATE OF ADMISSION:  09/20/2007  DATE OF DISCHARGE:  09/21/2007                               DISCHARGE SUMMARY   PRIMARY CARDIOLOGIST:  Everardo Beals. Juanda Chance, MD   PRIMARY CARE PHYSICIAN:  Alfonse Alpers. Dagoberto Ligas, M.D.   DISCHARGE DIAGNOSIS:  Unstable angina.   SECONDARY DIAGNOSES:  1. Coronary artery disease.  2. Hypertension.  3. Hyperlipidemia.  4. Depression.  5. Diabetes mellitus.   ALLERGIES:  PENICILLIN, SULFA.   PROCEDURES:  1. Left heart cardiac catheterization.  2. Successful percutaneous coronary intervention.  3. Stenting of the distal right coronary artery with placement of a      3.5 x 16 mm Liberte bare metal stent.   HISTORY OF PRESENT ILLNESS:  A 75 year old Caucasian male with recent  hospitalization for chest pain, and follow up with Dr. Juanda Chance after a  Myoview scan was performed revealing inferior ischemia.  Decision was  made, at that point, to pursue outpatient cardiac catheterization.   HOSPITAL COURSE:  The patient underwent cardiac catheterization on March  16, and this revealed nonobstructive disease in the LAD with a 90%  stenosis in the distal RCA, normal LV function with an EF of 60%.  Decision was made to admit the patient for a PCI of the RCA.  She taken  to the main cath lab upstairs, and underwent successful PCI and stenting  of distal RCA with placement of a 3.5 x 16 mm Liberte bare metal stent.   He tolerated the procedure well and postprocedure his cardiac markers  remained negative.  He has been ambulating without symptoms or  limitations.  He will be discharged home today in good condition.   DISCHARGE LABS:  Hemoglobin 11.9, hematocrit 34.6, WBC 5.3, platelets  148, MCV 95.8.  Sodium 128, potassium  3.4, chloride 107, CO2 25, BUN 10,  creatinine 0.8, glucose of 137.  CK 131, MB 2.9, calcium 8.2.   DISPOSITION:  Patient is being discharged home today in good condition.   FOLLOWUP PLAN AND APPOINTMENTS:  He is to followup with Dr. Juanda Chance on  March 27 at 10:15 a.m..  He is asked to followup Dr. Dagoberto Ligas as  previously scheduled.   DISCHARGE MEDICATIONS:  1. Aspirin 325 mg daily.  2. Plavix 75 mg daily.  3. Verapamil 240 mg b.i.d.  4. Crestor 20 mg daily.  5. K-Dur 10 mEq daily.  6. Allopurinol 300 mg daily.  7. Triamterene/HCTZ 37.5/25 mg daily.  8. Actos 45 mg daily.  9. Januvia 100 mg daily.  10.Niaspan 1000 mg nightly.  11.Lantus 10 units daily.  12.Metformin 500 mg b.i.d. to be resumed March 19.  13.Citalopram 40 mg daily.  14.Ambien 10 mg nightly.  15.Diazepam 5 mg t.i.d. p.r.n.  16.Nitroglycerin 0.4 mg sublingual p.r.n. chest pain.   __________ none.   DURATION DISCHARGE ENCOUNTER:  60 minutes including physician time.  Nicolasa Ducking, ANP      Bruce R. Juanda Chance, MD, [facility]  Electronically Signed    CB/MEDQ  D:  09/21/2007  T:  09/21/2007  Job:  [id number]   cc:   Alfonse Alpers. Dagoberto Ligas, M.D.

## 2010-11-19 NOTE — Cardiovascular Report (Signed)
NAMEAKIRA, ADELSBERGER              ACCOUNT NO.:  [id number]   MEDICAL RECORD NO.:  [id number]          PATIENT TYPE:  OIB   LOCATION:  1961                         FACILITY:  [facility]   PHYSICIAN:  Bruce R. Juanda Chance, MD, FACCDATE OF BIRTH:  1935-08-27   DATE OF PROCEDURE:  09/20/2007  DATE OF DISCHARGE:                            CARDIAC CATHETERIZATION   CLINICAL HISTORY:  Mr. Roseboom is 75 years old and was recently  hospitalized for chest pain and outpatient Myoview scan which showed  inferior ischemia.  He was scheduled for evaluation with angiography in  the outpatient laboratory.   PROCEDURE:  The procedure was performed via the right femoral artery  using arterial sheath and 4-French preformed coronary catheters.  We  used a JL-5 catheter for injection of the left coronary artery.  The  patient tolerated the procedure well and left the laboratory in  satisfactory condition.   RESULTS:  Aortic pressure was 163/81 with a mean of 121.  Left neck  pressure was 163/18.   The left main coronary main was free of significant disease.   Left anterior descending artery gave rise to two diagonal branches and  three septal perforators.  There was a 30% narrowing in the very  proximal portion of the LAD.  There was 50-70% narrowing in the mid LAD  after the first two diagonal branches.  There was 50% ostial stenosis in  the first diagonal branch.  There was moderately heavy calcification of  the proximal LAD.   The circumflex artery gave rise to a marginal branch and two  posterolateral branches.  These vessels were free of significant  disease.   The right coronary artery was a moderate-sized vessel that gave rise to  a right ventricular branch, a posterior descending branch, and a  posterolateral branch.  There was 30% narrowing in the proximal vessel.  There was 40% narrowing in the midvessel.  There was 90% narrowing in  the mid-to-distal vessel located at the bend in the vessel.   There was  40% narrowing in the posterior descending branch.   Left ventriculogram performed in the RAO projection showed good wall  motion with no areas of hypokinesis.  The estimated ejection fraction  was 60%.  There was minimal prolapse of the mitral valve.   CONCLUSION:  Coronary artery disease with 50-70% narrowing in the  proximal LAD, 50% narrowing in the first diagonal branch of LAD, no  significant obstruction of the circumflex artery, 90% stenosis in the  mid-to-distal right coronary artery, and normal LV function.   RECOMMENDATIONS:  The patient has a tight lesion in the mid-to-distal  right coronary artery with recent admission for chest pain and positive  ischemia on his Myoview scan.  Will plan intervention.  The vessel  appears large and will probably use a bare-metal stent.      Bruce Elvera Lennox Juanda Chance, MD, [facility]  Electronically Signed     BRB/MEDQ  D:  09/20/2007  T:  09/20/2007  Job:  [id number]   cc:   Everardo Beals. Juanda Chance, MD, [facility]  Alfonse Alpers. Dagoberto Ligas, M.D.  Cardiopulmonary lab

## 2010-11-19 NOTE — H&P (Signed)
NAMETALON, REGALA NO.:  [id number]   MEDICAL RECORD NO.:  [id number]          PATIENT TYPE:  EMS   LOCATION:  MAJO                         FACILITY:  [facility]   PHYSICIAN:  Unice Cobble, MD     DATE OF BIRTH:  06/07/1936   DATE OF ADMISSION:  09/03/2007  DATE OF DISCHARGE:                              HISTORY & PHYSICAL   CHIEF COMPLAINT:  Chest pain.   HISTORY OF PRESENT ILLNESS:  This is a 75 year old white male who woke  up this morning with chest pain.  The pain was knife-like, substernal,  and radiated to his back without diaphoresis, shortness of breath,  nausea, vomiting, presyncope, or palpitations.  The pain subsided with  administration of Tylenol and aspirin; however, his pain returned this  evening.  In the emergency department, the pain was promptly relieved by  2 sublingual nitroglycerin within 10 minutes.  Activity at home is not  very exertional, and his maximal exertional activity is walking his  dogs, during which he has no similar symptoms.  He does not have a  history of edema, orthopnea, or PND.  This is the first time he has ever  had pain like this.   PAST MEDICAL HISTORY:  1. Nephrolithiasis.  2. Diabetes mellitus with a hemoglobin A1c last checked of 6.6.  3. Hypertension.  4. Depression.  5. Spontaneous pneumothorax at the age of 49.  6. Questionable history of mitral valve prolapse.   ALLERGIES:  PENICILLIN AND SULFA CAUSE ITCHING.   MEDICATIONS:  1. Metformin 500 mg b.i.d.  2. Verapamil 240 mg daily.  3. Crestor 20 mg daily.  4. Potassium chloride 10 mEq daily.  5. Allopurinol 300 mg daily.  6. Triamterene/hydrochlorothiazide 37.5/25 mg.  7. Aspirin 81 mg daily.  8. Niaspan 500 mg, 2 tablets in the p.m.  9. Citalopram 20 mg q.h.s.  10.Zolpidem 10 mg q.p.m.  11.Diazepam 5 mg t.i.d. p.r.n. neuropathy.  12.Lantus 10 units q.a.m.   SOCIAL HISTORY:  He lives in [place] with his wife.  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  He quit smoking in 1973.  No alcohol or  drugs.   FAMILY HISTORY:  His mother died of a stroke in her 51s.  His father  died of a heart attack at the age of 23.  That was the last of 6 heart  attacks for him with his first being in his mid 1s.  His siblings  include 1 brother who is healthy.   REVIEW OF SYSTEMS:  Complete review of systems done and found to be  otherwise negative except as stated in the HPI.   PHYSICAL EXAMINATION:  VITAL SIGNS:  Temperature is 100.6 with a pulse  of 76, respiratory rate is 20, blood pressure is 171/81, O2 sats are  100% on room air.  GENERAL:  This is an overweight white male in no acute distress.  HEENT:  Shows PERRLA, EOMI, MMM.  Oropharynx is clear without erythema or  exudates.  NECK:  Supple without lymphadenopathy, thyromegaly, bruits or jugular  venous distention.  HEART:  Regular rate and rhythm with a normal S1 and S2.  He has a 1-2/6  systolic murmur in the left lower sternal border.  Pulses are 2+ and  equal bilaterally without bruits.  LUNGS:  Clear to auscultation bilaterally.  SKIN:  Shows no rashes or lesions.  ABDOMEN:  Soft and nontender with normal bowel sounds.  No rebound or  guarding.  Negative hepatosplenomegaly.  MUSCULOSKELETAL:  Shows no joint deformity or effusions.  No spine or  costovertebral angle tenderness.  NEUROLOGIC:  He is alert and oriented x 3 with cranial nerves II-XII  grossly intact.  Strength 5/5 all extremities and axial groups.  Normal  sensation throughout.  Normal cerebellar function.   LABORATORY/X-RAY:  His chest x-ray shows a right upper lobe nodular  density, which will need to be followed up as an outpatient CT.  EKG  shows a rate of 71 with normal sinus rhythm.  Axis is left axis deviated  with left anterior fascicular block.  He also has a first-degree AV  block.  No ST/T wave changes.  His white count is 6.6 with a hemoglobin  of 13.8 and a  platelet count of 181.  Glucose  is 128.  Creatinine is  pending.  D-dimer is 0.38.  First point-of-care enzymes are negative.   ASSESSMENT/PLAN:  This is a 75 year old white male with a history of  hypertension, hyperlipidemia, diabetes who presents with chest pain.  It  is assumed that his chest pain is likely angina based on his 13-year  history of diabetes and prompt resolution of his chest pain with  nitroglycerin.  1. Chest pain.  I will treat his chest pain as unstable angina and      rule out MI.  Heparin drip will be started as well as aspirin and      statin continued.  He will need beta-blocker therapy to lower his      blood pressure.  Catheterization versus stress testing are to be      determined pending troponin levels.  2. Hypertension.  Continue medications and add beta blocker.  3. Slightly febrile to 100.6 without etiology.  He will be monitored      and a urinalysis will be checked.  4. Lung nodule.  This will need to be followed up as an outpatient      with a CT scan.  5. Diabetes mellitus.  His medications will need to be continued and      he will be given Lantus full-dose while he is eating.  Metformin      will be held and a sliding scale insulin will be administered.      Unice Cobble, MD  Electronically Signed     ACJ/MEDQ  D:  09/03/2007  T:  09/03/2007  Job:  [id number]

## 2010-11-19 NOTE — Assessment & Plan Note (Signed)
[facility]                            CARDIOLOGY OFFICE NOTE   NAME:[name], [name]                     MRN:          [id number]  DATE:09/15/2007                            DOB:          July 28, 1935    PRIMARY CARE PHYSICIAN:  Anson Fret, M.D.   PAST MEDICAL HISTORY:  Mr. [name] is 75 years old and returned for a  follow up visit following his hospitalization for chest pain.  I had  seen him in the remote past for problems related to hypertension but had  not seen him in many years.  He was ruled out for a myocardial  infarction and he was sent home for follow up Myoview scan.  The Myoview  scan was an exercise scan, but he did not reach target heart rate, so it  was converted to an adenosine scan and suggested inferior ischemia.  He  has had no chest pain since his discharge from the hospital.   He was also found to have a nodule on chest x-ray while in the hospital,  and he had a follow up CT scan here in our office since that time which  showed either a nodule or pleural thickening with recommendations for a  follow up CT scan in 6 months.   PAST MEDICAL HISTORY:  1. Significant for diabetes.  2. Hypertension.  3. Depression.   CURRENT MEDICATIONS:  1. Metformin 500 mg b.i.d.  2. Verapamil 240 mg 2 times a day.  3. Crestor 20 mg daily.  4. Potassium 10 mEq daily.  5. Allopurinol 300 mg daily.  6. Triamterene 37.5/25 one-half tablet daily.  7. Actos 45 mg daily.  8. Januvia 100 mg daily.  9. Aspirin 81 mg daily.  10.Niaspan 500 mg 2 in the evening.  11.Citalopram.  12.Zolpidem.  13.Diazepam.  14.He is also on Lantus insulin 10 units in the morning.   HE HAS A HISTORY OF ALLERGIES TO PENICILLIN AND SULFA WHICH CAUSES  ITCHING AND ALSO SAID HEPARIN CAUSED ITCHING, BUT THERE IS NO  DOCUMENTATION OF THROMBOCYTOPENIA THAT WE CAN ASCERTAIN.   On examination today, there was no venous distention.  The carotid  pulses were full  without bruits.  CHEST:  Clear.  The cardiac rhythm was regular.  I could hear no murmurs  or gallops.  ABDOMEN:  Soft without organomegaly.  Peripheral pulses were full.  There is no peripheral edema.   IMPRESSION:  1. Chest pain and abnormal Myoview scan suggestive of ischemia.  2. Abnormal chest x-ray with right upper lobe nodule versus pleural      thickening.  3. Insulin-dependent diabetes.  4. Hypertension.  5. Depression.  6. Hyperlipidemia.   RECOMMENDATIONS:  In view of Mr. [name]'s symptoms and abnormal  Myoview scan suggest possibility of significant obstructive coronary  disease.  I think we should evaluate him further with angiography and we  have made arrangements for him to come in the hospital next week.  We  will stop his metformin the day of his procedure and also hold his Actos  and Januvia the day of the procedure and give  him half of his Lantus  insulin dose.  We will plan on doing him in the [facility] lab.     Bruce Elvera Lennox Juanda Chance, MD, [facility]  Electronically Signed    BRB/MedQ  DD: 09/15/2007  DT: 09/16/2007  Job #: [id number]

## 2010-11-19 NOTE — Assessment & Plan Note (Signed)
[facility]                            CARDIOLOGY OFFICE NOTE   NAME:[name], [name]                     MRN:          [id number]  DATE:12/01/2007                            DOB:          September 23, 1935    PRIMARY CARE PHYSICIAN:  Dr. Corrin Parker.   CLINICAL HISTORY:  [name] is 75 years old and returns for  followup management of his coronary heart disease.  He recently  underwent a bare metal stent to the right coronary artery for a lesion  associated with exertional angina.  I saw him back in followup and he  was still having symptoms of fatigue.  We did a followup Myoview scan  which showed no evidence of ischemia.  He did have a junctional rhythm  with exercise and could not get his heart rate up past 87, so it was  converted to an adenosine scan.  He was taking verapamil 240 b.i.d.   He says he has had some symptoms of fatigue but this has improved.  He  had some sense of palpitations and we got some laboratory studies  including a potassium and magnesium, but these symptoms have also  improved.  Potassium was borderline low.  His hemoglobin was also  borderline low but had improved.   PAST MEDICAL HISTORY:  Significant for hypertension, diabetes,  hyperlipidemia.  He also has an abnormal nodule on CT scan which is  followed by Dr. Marchelle Gearing.   CURRENT MEDICATIONS:  1. Include metformin.  2. Verapamil 240 mg b.i.d.  3. Crestor 12 mg daily.  4. K-Dur 10 mEq daily.  5. Ultram triamterine 37/5/25 one-half tablet daily.  6. Actos.  7. Januvia.  8. Niaspan 500 mg 2 tablets daily.  9. Zolpidem.  10.Citalopram.  11.Insulin.  12.Aspirin.  13.Plavix.  14.Vitamin B12.   EXAMINATION:  The blood pressure was 115/57, pulse 50 and regular.  There is no venous distension.  The carotid pulses were full without  bruits.  CHEST:  Was clear.  HEART:  Rhythm was regular.  No murmurs or gallops.  ABDOMEN:  Soft without organomegaly.  Peripheral pulses were full.  No peripheral edema.   IMPRESSION:  1. Coronary artery status post recent stenting of the right coronary      now stable with a negative Myoview scan.  2. Junctional rhythm and chronotropic incompetence, possibly related      to verapamil versus intrinsic sinus node disease.  3. Hypertension.  4. Insulin dependent diabetes.  5. Hyperlipidemia.  6. History of depression.   RECOMMENDATIONS:  I think [name] is doing well.  He may have  chronotropic incompetence.  We will stop the verapamil and substitute  Norvasc 10 mg.  He is still in the rehab program, so we can monitor his  heart rates, and I told him we could expect him to get heart rates above  100.  His potassium has been low to borderline low so will increase his  K-Dur from 10 to 20 mEq a day.  I will plan to see him back in 3 months  to readdress the potential problem  of chronotropic incontinence and  symptoms of exertional fatigue.     Bruce Elvera Lennox Juanda Chance, MD, [facility]  Electronically Signed    BRB/MedQ  DD: 12/01/2007  DT: 12/01/2007  Job #: [id number]

## 2010-11-19 NOTE — Assessment & Plan Note (Signed)
[facility] [facility]                            CARDIOLOGY OFFICE NOTE   NAME:Mcpartlin, BOLDEN HAGERMAN                     MRN:          [id number]  DATE:10/01/2007                            DOB:          1936/05/04    CLINICAL HISTORY:  [name] is 75 years old and returns for  management with coronary heart disease.  He had recently been  hospitalized with chest pain and then had an abnormal Myoview scan which  suggested inferior ischemia.  He underwent catheterization in the  outpatient laboratory, and was found to have a tight lesion in the mid  right coronary for which he underwent stenting with a bare metal stent.   He has done quite well since that time.  He did have a fairly large knot  in his right groin that he called about and we planned to check today.  He said he has had some symptoms of fatigue since his stent was put in,  and I wonder if this is related to his Plavix.   His other current problem relates to an abnormal chest x-ray and  subsequent CT which showed a 6 mm right upper lobe nodular density.  The  recommendation is from Dr. Frazier Richards for a follow-up CT in 6-12 months.   PAST MEDICAL HISTORY:  Significant for hypertension, diabetes,  hyperlipidemia, and hypertension.   CURRENT MEDICATIONS:  1. Metformin.  2. Verapamil 240 mg b.i.d.  3. Crestor 20 mg daily.  4. K-Dur 10 mEq daily.  5. Allopurinol.  6. Triamterene/hydrochlorothiazide 37.5/25 1/2 daily.  7. Actos.  8. Januvia.  9. Niaspan 500 mg two daily.  10.Citalopram.  11.Zolpidem.  12.Insulin.  13.Aspirin.  14.Plavix.  15.Vitamin B12.   PHYSICAL EXAMINATION:  VITAL SIGNS:  Today the blood pressure was  110/58, pulse 70 and regular.  NECK:  There was no venous distension.  The carotid pulses were full  without bruits.  CHEST:  Clear.  CARDIAC:  Rhythm was regular with no murmurs or gallops.  ABDOMEN:  Soft with normal bowel sounds.  There is no  hepatosplenomegaly.  EXTREMITIES:  Right femoral artery site had a fairly large-sized knot,  but there was no widened pulsation and no bruit.  It was in between the  size of a small and large marble.  The peripheral pulses were full.  There was no peripheral edema.   IMPRESSION:  1. Coronary artery disease status post recent stenting of the mid      right coronary using a bare metal stent.  2. Good left ventricular function.  3. Right upper lobe nodular density 6 mm by CT scan.  4. Diabetes.  5. Hypertension.  6. Hyperlipidemia.  7. Fatigue.   I think Mr. Cocuzza is doing well.  I think the right femoral site is  okay.  He has symptoms of fatigue, and will get a BMP, CBC, and TSH.  I  told him I did not think this was from the Plavix and most likely  related to conditioning.  I discussed the nodule by CT with Mr.  Codispoti, and he and his wife and  I decided a pulmonary opinion would  be helpful and I have arranged for him to have this done in the near  future.  I will plan to see him back in follow-up in eight weeks.     Bruce Elvera Lennox Juanda Chance, MD, [facility]  Electronically Signed    BRB/MedQ  DD: 10/01/2007  DT: 10/02/2007  Job #: [id number]   cc:   Alfonse Alpers. Dagoberto Ligas, M.D.  Cardiopulmonary Lab

## 2010-11-19 NOTE — Assessment & Plan Note (Signed)
[facility]                            CARDIOLOGY OFFICE NOTE   NAME:WHITFIELDKrayton, Wortley                     MRN:          [id number]  DATE:02/23/2008                            DOB:          [birth year]    PRIMARY CARE PHYSICIAN:  Alfonse Alpers. Dagoberto Ligas, MD   CLINICAL HISTORY:  Mr. Mancuso is 75 years old and returned for a  followup management of his coronary heart disease.  He has a remote  history of mitral valve prolapse.  He was hospitalized with chest pain  and subsequently had a cath and intervention on his right coronary  artery on September 20, 2007, using a 3.5 Liberte stent in the right  coronary artery.  I saw him back and he had symptoms of fatigue and we  did a stress test, and he had a chronotropic incompetence and was only  able to get his heart rate up to 87.  We felt the verapamil might be  contributing this and we switched him to Norvasc and then to Plendil.   His fatigue has improved, but he has developed recurrent diarrhea.  He  has a long history of irritable bowel syndrome and verapamil seem to  help this the most over the years.   He has had no recent chest pain or shortness of breath or palpitations.   His past medical history is significant for hypertension, diabetes, and  hyperlipidemia.  He also had a nodule on CT scan which Dr. Marchelle Gearing had  followed and he would recommend a followup CT scan in August.   His current medications include Crestor, allopurinol, triamterene,  Actos, Januvia, Niaspan, insulin, aspirin, Plavix, and Plendil.   PHYSICAL EXAMINATION:  VITAL SIGNS:  Blood pressure 156/74 and the pulse  67 and regular.  NECK:  There was no venous distension.  Carotid pulses were full.  There  were no bruits.  CHEST:  Clear without rales or rhonchi.  CARDIAC:  Rhythm was regular.  No murmurs or gallops.  ABDOMEN:  Soft with normal bowel sounds.  EXTREMITIES:  Peripheral pulses were full.  There was no peripheral  edema.   Electrocardiogram showed left axis deviation and LVH.   IMPRESSION:  1. Coronary artery disease status post percutaneous coronary      intervention of the mid-right coronary artery using a bare-metal      stent, March 2009.  2. History of remote mitral valve prolapse.  3. 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  4. A 6-cm nodule by CT scan.  5. Diabetes.  6. Hypertension.  7. Hyperlipidemia.   RECOMMENDATIONS:  Mr. Wise felt much better on verapamil because it  seemed to help with his symptoms of irritable bowel syndrome.  Although  his fatigue is better, he would like to try back on the verapamil to see  if this will help with his GI symptoms.  We will plan to start him on  half dose of what he was on before to 120 b.i.d. and we will stop the  Plendil.  I told him if his systolic pressure is not at target of 130 or  close,  he should call us and we will make further adjustments in the  dosage.  If he develops any increasing symptoms of fatigue, then we will  reevaluate him and consider stress test off verapamil to see if he can  document chronotropic incompetence off drugs.  Otherwise, I will see him  back in 6 months.   ADDENDUM:  Dr. Marchelle Gearing plans a CT scan in early September 2009 to  follow up on the pulmonary nodule.     Bruce Elvera Lennox Juanda Chance, MD, [facility]  Electronically Signed    BRB/MedQ  DD: 02/23/2008  DT: 02/24/2008  Job #: [id number]

## 2010-11-22 NOTE — Discharge Summary (Signed)
[place]  Patient:    [name]                      MRN: [id number] Adm. Date:  [id number] Attending:  Lorre Nick                           Discharge Summary  CHIEF COMPLAINT:  This is a 75 year old man who presents with a history of abdominal pain.  HISTORY OF PRESENT ILLNESS:  The patient has had three renal calculi in the past. He ate a meal last night, and subsequent to that he had pain in his left flank area.  He then had a bowel movement.  He also noted that his urine was dark at hat time.  He presented to the emergency room with similar pain.  This has been a gnawing-type pain and has been continuous.  He has not been vomiting, and has not had any diarrhea.  PHYSICAL EXAMINATION:  LUNGS:  Clear.  CARDIOVASCULAR:  Rhythm regular.  ABDOMEN:  Soft, no masses present.  Mild tenderness present in the left lower quadrant of the abdomen.  BACK:  No CVA tenderness.  LABORATORY DATA:  Urinalysis was 15-20 rbcs without any white cells.  White count was 7000.  IMPRESSION:  Renal calculus.  PLAN:  The patient will have a CT scan.  As long as there is no obstruction, he  will be treated with analgesics, and be discharged.  In addition, a prescription for Cipro 500 mg b.i.d. will be given, and also Percocet q.4h. p.r.n. pain. DD:  06/30/99 TD:  06/30/99 Job: [id number]

## 2010-12-24 ENCOUNTER — Ambulatory Visit: Payer: BC Managed Care – PPO | Admitting: Hematology & Oncology

## 2011-01-13 ENCOUNTER — Other Ambulatory Visit: Payer: Self-pay | Admitting: Hematology & Oncology

## 2011-01-13 ENCOUNTER — Ambulatory Visit (HOSPITAL_BASED_OUTPATIENT_CLINIC_OR_DEPARTMENT_OTHER): Payer: BC Managed Care – PPO | Admitting: Hematology & Oncology

## 2011-01-13 DIAGNOSIS — D6949 Other primary thrombocytopenia: Secondary | ICD-10-CM

## 2011-01-13 DIAGNOSIS — D510 Vitamin B12 deficiency anemia due to intrinsic factor deficiency: Secondary | ICD-10-CM

## 2011-01-13 LAB — CBC WITH DIFFERENTIAL (CANCER CENTER ONLY)
BASO%: 0.7 % (ref 0.0–2.0)
MCH: 34 pg — ABNORMAL HIGH (ref 28.0–33.4)
MCHC: 35.5 g/dL (ref 32.0–35.9)
MCV: 96 fL (ref 82–98)
MONO%: 10.4 % (ref 0.0–13.0)
NEUT#: 2.3 10*3/uL (ref 1.5–6.5)
RDW: 12.5 % (ref 11.1–15.7)
WBC: 4 10*3/uL (ref 4.0–10.0)

## 2011-01-14 LAB — RETICULOCYTES (CHCC)
ABS Retic: 69.1 10*3/uL (ref 19.0–186.0)
RBC.: 3.84 MIL/uL — ABNORMAL LOW (ref 4.22–5.81)
Retic Ct Pct: 1.8 % (ref 0.4–2.3)

## 2011-01-14 LAB — IRON AND TIBC
Iron: 54 ug/dL (ref 42–165)
TIBC: 310 ug/dL (ref 215–435)

## 2011-03-31 LAB — BASIC METABOLIC PANEL
BUN: 15
CO2: 26
CO2: 25
Calcium: 8.2 — ABNORMAL LOW
Chloride: 103
Creatinine, Ser: 0.92
Creatinine, Ser: 0.86
GFR calc Af Amer: >60
GFR calc non Af Amer: >60
Glucose, Bld: 200 — ABNORMAL HIGH

## 2011-03-31 LAB — POCT CARDIAC MARKERS
CKMB, poc: <1 — ABNORMAL LOW
Myoglobin, poc: 65.3
Operator id: 272551
Troponin i, poc: <0.05

## 2011-03-31 LAB — CBC
HCT: 40.8
Hemoglobin: 13.8
MCHC: 34.3
Platelets: 148 — ABNORMAL LOW
Platelets: 181
RBC: 4.01 — ABNORMAL LOW
RBC: 4.32
RBC: 3.61 — ABNORMAL LOW
WBC: 3.8 — ABNORMAL LOW
WBC: 5.3

## 2011-03-31 LAB — POCT I-STAT GLUCOSE: Glucose, Bld: 122 — ABNORMAL HIGH

## 2011-03-31 LAB — URINE MICROSCOPIC-ADD ON

## 2011-03-31 LAB — URINALYSIS, ROUTINE W REFLEX MICROSCOPIC
Bilirubin Urine: NEGATIVE
Glucose, UA: 500 — AB
Ketones, ur: NEGATIVE
Leukocytes, UA: NEGATIVE
Nitrite: NEGATIVE
Protein, ur: NEGATIVE
Specific Gravity, Urine: 1.013
Urobilinogen, UA: 0.2
pH: 5.5

## 2011-03-31 LAB — I-STAT 8, (EC8 V) (CONVERTED LAB)
Acid-Base Excess: 3 — ABNORMAL HIGH
BUN: 21
Bicarbonate: 29 — ABNORMAL HIGH
Chloride: 103
Glucose, Bld: 128 — ABNORMAL HIGH
HCT: 44
Hemoglobin: 15
Operator id: 272551
Potassium: 3.6
Sodium: 139
TCO2: 30
pCO2, Ven: 48
pH, Ven: 7.39 — ABNORMAL HIGH

## 2011-03-31 LAB — DIFFERENTIAL
Basophils Relative: 0
Monocytes Absolute: 0.8
Monocytes Relative: 12
Neutrophils Relative %: 71

## 2011-03-31 LAB — CK TOTAL AND CKMB (NOT AT ARMC)
CK, MB: 2.2
CK, MB: 2.9
Relative Index: 2.2
Total CK: 160
Total CK: 131

## 2011-03-31 LAB — CARDIAC PANEL(CRET KIN+CKTOT+MB+TROPI)
CK, MB: 1.3
Relative Index: INVALID
Troponin I: 0.02

## 2011-03-31 LAB — LIPID PANEL
LDL Cholesterol: 55
Triglycerides: 29

## 2011-03-31 LAB — HEPARIN LEVEL (UNFRACTIONATED): Heparin Unfractionated: 1.58 — ABNORMAL HIGH

## 2011-03-31 LAB — POCT I-STAT CREATININE
Creatinine, Ser: 1.2
Operator id: 272551

## 2011-03-31 LAB — TSH: TSH: 1.499

## 2011-08-08 DIAGNOSIS — E118 Type 2 diabetes mellitus with unspecified complications: Secondary | ICD-10-CM | POA: Insufficient documentation

## 2011-08-08 DIAGNOSIS — E113299 Type 2 diabetes mellitus with mild nonproliferative diabetic retinopathy without macular edema, unspecified eye: Secondary | ICD-10-CM

## 2011-08-08 DIAGNOSIS — E119 Type 2 diabetes mellitus without complications: Secondary | ICD-10-CM | POA: Insufficient documentation

## 2011-08-08 HISTORY — DX: Type 2 diabetes mellitus with mild nonproliferative diabetic retinopathy without macular edema, unspecified eye: E11.3299

## 2011-08-08 HISTORY — DX: Type 2 diabetes mellitus without complications: E11.9

## 2012-02-18 ENCOUNTER — Ambulatory Visit (HOSPITAL_COMMUNITY): Payer: BC Managed Care – PPO | Attending: Cardiovascular Disease

## 2012-02-18 ENCOUNTER — Ambulatory Visit (INDEPENDENT_AMBULATORY_CARE_PROVIDER_SITE_OTHER): Payer: BC Managed Care – PPO | Admitting: Cardiovascular Disease

## 2012-02-18 ENCOUNTER — Encounter: Payer: Self-pay | Admitting: Cardiovascular Disease

## 2012-02-18 VITALS — BP 171/83 | HR 69 | Ht 74.0 in | Wt 204.0 lb

## 2012-02-18 DIAGNOSIS — I2510 Atherosclerotic heart disease of native coronary artery without angina pectoris: Secondary | ICD-10-CM

## 2012-02-18 DIAGNOSIS — Z87891 Personal history of nicotine dependence: Secondary | ICD-10-CM | POA: Insufficient documentation

## 2012-02-18 DIAGNOSIS — R0609 Other forms of dyspnea: Secondary | ICD-10-CM | POA: Insufficient documentation

## 2012-02-18 DIAGNOSIS — I079 Rheumatic tricuspid valve disease, unspecified: Secondary | ICD-10-CM | POA: Insufficient documentation

## 2012-02-18 DIAGNOSIS — R0989 Other specified symptoms and signs involving the circulatory and respiratory systems: Secondary | ICD-10-CM | POA: Insufficient documentation

## 2012-02-18 DIAGNOSIS — E119 Type 2 diabetes mellitus without complications: Secondary | ICD-10-CM | POA: Insufficient documentation

## 2012-02-18 DIAGNOSIS — I10 Essential (primary) hypertension: Secondary | ICD-10-CM | POA: Insufficient documentation

## 2012-02-18 DIAGNOSIS — I080 Rheumatic disorders of both mitral and aortic valves: Secondary | ICD-10-CM | POA: Insufficient documentation

## 2012-02-18 DIAGNOSIS — E785 Hyperlipidemia, unspecified: Secondary | ICD-10-CM | POA: Insufficient documentation

## 2012-02-18 NOTE — Progress Notes (Signed)
Echocardiogram performed.  

## 2012-02-18 NOTE — Assessment & Plan Note (Addendum)
Recent SOB and fatigue. Will arrange Lexiscan myoview and echo to assess. He has not had stress testing in many years. No change in medications today. He will follow BP at home and let us know if it is elevated. Has been well controlled recently at home. Continue statin.

## 2012-02-18 NOTE — Progress Notes (Signed)
   History of Present Illness:76 yo male with history of CAD, HTN, HLD here today for cardiac follow up. He has been followed in the past by Dr. Juanda Chance and has not been seen here since February 2011 by Dr. Juanda Chance.  He underwent stenting of the right coronary artery in March 2009 with a 3.5 Liberte bare-metal stent.   He is here today for cardiac follow up. He has had no recurrent chest pain. He does note  shortness breath with minimal exertion and overall fatigue. His legs feel weak after exercise.  Taking all meds.   Primary Care Physician: Dr. Eula Listen [facility])  Last Lipid Profile: followed in primary care.   Past Medical History  Diagnosis Date  . CAD (coronary artery disease)     Bare meta stent RCA  . HTN (hypertension)   . Hyperlipidemia   . Diabetes mellitus   . Peripheral neuropathy     Past Surgical History  Procedure Date  . Tonsillectomy     No current outpatient prescriptions on file.    Allergies  Allergen Reactions  . Heparin   . Penicillins   . Sulfonamide Derivatives     History   Social History  . Marital Status: Married    Spouse Name: N/A    Number of Children: N/A  . Years of Education: N/A   Occupational History  . Retired-CPA. Former Human resources officer    Social History Main Topics  . Smoking status: Former Smoker -- 0.5 packs/day for 10 years    Types: Cigarettes    Quit date: 02/05/1972  . Smokeless tobacco: Never Used  . Alcohol Use: 3.5 oz/week    7 drink(s) per week  . Drug Use: No  . Sexually Active: Not on file   Other Topics Concern  . Not on file   Social History Narrative  . No narrative on file    Family History  Problem Relation Age of Onset  . Heart attack Father 32  . Heart failure Mother     Review of Systems:  As stated in the HPI and otherwise negative.   BP 171/83  Pulse 69  Ht 6\' 2"  (1.88 m)  Wt 204 lb (92.534 kg)  BMI 26.19 kg/m2  Physical Examination: General: Well developed, well  nourished, NAD HEENT: OP clear, mucus membranes moist SKIN: warm, dry. No rashes. Neuro: No focal deficits Musculoskeletal: Muscle strength 5/5 all ext Psychiatric: Mood and affect normal Neck: No JVD, no carotid bruits, no thyromegaly, no lymphadenopathy. Lungs:Clear bilaterally, no wheezes, rhonci, crackles Cardiovascular: Regular rate and rhythm. No murmurs, gallops or rubs. Abdomen:Soft. Bowel sounds present. Non-tender.  Extremities: No lower extremity edema. Pulses are 2 + in the bilateral DP/PT.  EAV:[id number], 1st degree AV block, LAFB. Old septal infarct.

## 2012-02-18 NOTE — Patient Instructions (Addendum)
Your physician recommends that you schedule a follow-up appointment in: 3 weeks  Your physician has requested that you have an echocardiogram. Echocardiography is a painless test that uses sound waves to create images of your heart. It provides your doctor with information about the size and shape of your heart and how well your heart's chambers and valves are working. This procedure takes approximately one hour. There are no restrictions for this procedure.  Your physician has requested that you have a lexiscan myoview. For further information please visit [email or web address]. Please follow instruction sheet, as given.    

## 2012-02-19 ENCOUNTER — Other Ambulatory Visit (HOSPITAL_COMMUNITY): Payer: BC Managed Care – PPO

## 2012-02-23 ENCOUNTER — Ambulatory Visit (HOSPITAL_COMMUNITY): Payer: BC Managed Care – PPO | Attending: Cardiovascular Disease | Admitting: Radiology

## 2012-02-23 VITALS — BP 173/85 | HR 54 | Ht 74.0 in | Wt 203.0 lb

## 2012-02-23 DIAGNOSIS — I10 Essential (primary) hypertension: Secondary | ICD-10-CM | POA: Insufficient documentation

## 2012-02-23 DIAGNOSIS — R0609 Other forms of dyspnea: Secondary | ICD-10-CM | POA: Insufficient documentation

## 2012-02-23 DIAGNOSIS — R42 Dizziness and giddiness: Secondary | ICD-10-CM | POA: Insufficient documentation

## 2012-02-23 DIAGNOSIS — R5381 Other malaise: Secondary | ICD-10-CM | POA: Insufficient documentation

## 2012-02-23 DIAGNOSIS — R0989 Other specified symptoms and signs involving the circulatory and respiratory systems: Secondary | ICD-10-CM | POA: Insufficient documentation

## 2012-02-23 DIAGNOSIS — E119 Type 2 diabetes mellitus without complications: Secondary | ICD-10-CM

## 2012-02-23 DIAGNOSIS — Z87891 Personal history of nicotine dependence: Secondary | ICD-10-CM | POA: Insufficient documentation

## 2012-02-23 DIAGNOSIS — I2510 Atherosclerotic heart disease of native coronary artery without angina pectoris: Secondary | ICD-10-CM

## 2012-02-23 DIAGNOSIS — R0602 Shortness of breath: Secondary | ICD-10-CM

## 2012-02-23 MED ORDER — REGADENOSON 0.4 MG/5ML IV SOLN
0.4000 mg | Freq: Once | INTRAVENOUS | Status: AC
  Administered 2012-02-23: 0.4 mg via INTRAVENOUS

## 2012-02-23 MED ORDER — TECHNETIUM TC 99M TETROFOSMIN IV KIT
11.0000 | PACK | Freq: Once | INTRAVENOUS | Status: AC | PRN
  Administered 2012-02-23: 11 via INTRAVENOUS

## 2012-02-23 MED ORDER — TECHNETIUM TC 99M TETROFOSMIN IV KIT
33.0000 | PACK | Freq: Once | INTRAVENOUS | Status: AC | PRN
  Administered 2012-02-23: 33 via INTRAVENOUS

## 2012-02-23 NOTE — Progress Notes (Signed)
[facility] SITE 3 NUCLEAR MED [address] [place] [address] [phone]  Cardiology Nuclear Med Study  [name] is a 76 y.o. male     MRN : [id number]     DOB: [birth year]  Procedure Date: 02/23/2012  Nuclear Med Background Indication for Stress Test:  Evaluation for Ischemia and Stent Patency. History:  '09 ION:[id number] ischemia>Stent-RCA, EF=60%; 8/13 Echo:EF=60-65%, moderate LVH. Cardiac Risk Factors: Family History - CAD, History of Smoking, Hypertension, IDDM Type 2 and Lipids.  Symptoms:  Dizziness, DOE and Fatigue.   Nuclear Pre-Procedure Caffeine/Decaff Intake:  None > 12 hrs NPO After: 8:00pm   Lungs:  Clear. O2 Sat: 96% on room air. IV 0.9% NS with Angio Cath:  22g  IV Site: L Antecubital x 1, tolerated well IV Started by:  Irean Hong, RN  Chest Size (in):  46 Cup Size: n/a  Height: 6\' 2"  (1.88 m)  Weight:  203 lb (92.08 kg)  BMI:  Body mass index is 26.06 kg/(m^2). Tech Comments:  No diabetic medications today.    Nuclear Med Study 1 or 2 day study: 1 day  Stress Test Type:  Eugenie Birks  Reading MD: Olga Millers, MD  Order Authorizing Provider:  Verne Carrow, MD  Resting Radionuclide: Technetium 4m Tetrofosmin  Resting Radionuclide Dose: 11.0 mCi   Stress Radionuclide:  Technetium 59m Tetrofosmin  Stress Radionuclide Dose: 33.0 mCi           Stress Protocol Rest HR: 54 Stress HR:    62  Rest BP: 173/85 Stress BP: 178/85  Exercise Time (min): n/a METS: n/a   Predicted Max HR: 146 bpm % Max HR: 42.47 bpm Rate Pressure Product: 32440   Dose of Adenosine (mg):  n/a Dose of Lexiscan: 0.4 mg  Dose of Atropine (mg): n/a Dose of Dobutamine: n/a mcg/kg/min (at max HR)  Stress Test Technologist: Smiley Houseman, CMA-N  Nuclear Technologist:  Domenic Polite, CNMT     Rest Procedure:  Myocardial perfusion imaging was performed at rest 45 minutes following the intravenous administration of Technetium 53m Tetrofosmin.  Rest  ECG: LAFB with new T-wave changes.  Stress Procedure:  The patient received IV Lexiscan 0.4 mg over 15-seconds.  Technetium 69m Tetrofosmin injected at 30-seconds.  There were no significant changes with Lexiscan.  Quantitative spect images were obtained after a 45 minute delay.  New EKG changes were reviewed with Dr. Clifton James prior to stress.  EKG's and stress images were also reviewed by Dr. Clifton James.    Stress ECG: No significant change from baseline ECG  QPS Raw Data Images:  Acquisition technically good; normal left ventricular size. Stress Images:  Normal homogeneous uptake in all areas of the myocardium. Rest Images:  Normal homogeneous uptake in all areas of the myocardium. Subtraction (SDS):  No evidence of ischemia. Transient Ischemic Dilatation (Normal <1.22):  0.87 Lung/Heart Ratio (Normal <0.45):  0.35  Quantitative Gated Spect Images QGS EDV:  105 ml QGS ESV:  38 ml  Impression Exercise Capacity:  Lexiscan with no exercise. BP Response:  Normal blood pressure response. Clinical Symptoms:  No chest pain or dyspnea. ECG Impression:  No significant ST segment change suggestive of ischemia. Comparison with Prior Nuclear Study: No previous nuclear study performed  Overall Impression:  Normal stress nuclear study.  LV Ejection Fraction: 64%.  LV Wall Motion:  NL LV Function; NL Wall Motion   Olga Millers

## 2012-02-24 ENCOUNTER — Telehealth: Payer: Self-pay | Admitting: *Deleted

## 2012-02-24 ENCOUNTER — Other Ambulatory Visit: Payer: Self-pay | Admitting: *Deleted

## 2012-02-24 ENCOUNTER — Ambulatory Visit (INDEPENDENT_AMBULATORY_CARE_PROVIDER_SITE_OTHER): Payer: BC Managed Care – PPO | Admitting: Cardiovascular Disease

## 2012-02-24 ENCOUNTER — Encounter: Payer: Self-pay | Admitting: *Deleted

## 2012-02-24 ENCOUNTER — Encounter: Payer: Self-pay | Admitting: Cardiovascular Disease

## 2012-02-24 VITALS — BP 177/73 | HR 54 | Ht 74.0 in | Wt 203.0 lb

## 2012-02-24 DIAGNOSIS — I2510 Atherosclerotic heart disease of native coronary artery without angina pectoris: Secondary | ICD-10-CM

## 2012-02-24 MED ORDER — NITROGLYCERIN 0.4 MG SL SUBL: 0.4000 mg | SUBLINGUAL_TABLET | SUBLINGUAL | Status: DC | PRN

## 2012-02-24 NOTE — Telephone Encounter (Signed)
Pt must have dropped cath instructions prior to leaving office today. I left message on home number that paperwork would be at front desk for him to pick up. Left message to call back if questions.

## 2012-02-24 NOTE — Patient Instructions (Signed)
Your physician recommends that you schedule a follow-up appointment in:  4 weeks.   Your physician recommends that you return for lab work on March 01, 2012.  The lab opens at 8:30 daily.     Your physician has requested that you have a cardiac catheterization. Cardiac catheterization is used to diagnose and/or treat various heart conditions. Doctors may recommend this procedure for a number of different reasons. The most common reason is to evaluate chest pain. Chest pain can be a symptom of coronary artery disease (CAD), and cardiac catheterization can show whether plaque is narrowing or blocking your heart's arteries. This procedure is also used to evaluate the valves, as well as measure the blood flow and oxygen levels in different parts of your heart. For further information please visit [email or web address] Please follow instruction sheet, as given. Scheduled for March 04, 2012.

## 2012-02-24 NOTE — Progress Notes (Signed)
 History of Present Illness: 76 yo male with history of CAD, HTN, HLD here today for cardiac follow up. He has been followed in the past by Dr. Brodie. I saw him for the first time 02/18/12. He underwent stenting of the right coronary artery in March 2009 with a 3.5 Liberte bare-metal stent. He had c/o SOB and fatigue at his visit last week. I arranged a stress myoview which showed no large areas of ischemia but he has been noted to have evolution of his T wave inversions since last visit. This was a Lexiscan myoview since he cannot walk on the treadmill. Echo with preserved LV function and no significant valvular issues.   He is back today for f/u. He continues to have severe dyspnea with exertion. He has no chest pain but he is profoundly fatigued. He can only walk short distance before he feels dyspnea and fatigue. This is similar to his presentation before he had his RCA stent placed in 2009.   Primary Care Physician: Dr. Hussain ([facility])   Last Lipid Profile: followed in primary care  Past Medical History  Diagnosis Date  . CAD (coronary artery disease)     Bare meta stent RCA  . HTN (hypertension)   . Hyperlipidemia   . Diabetes mellitus   . Peripheral neuropathy     Past Surgical History  Procedure Date  . Tonsillectomy     Current Outpatient Prescriptions  Medication Sig Dispense Refill  . allopurinol (ZYLOPRIM) 300 MG tablet Take 300 mg by mouth daily.       . aspirin 325 MG tablet Take 325 mg by mouth daily.      . BENICAR 40 MG tablet Take 20 mg by mouth daily.       . citalopram (CELEXA) 40 MG tablet Take 40 mg by mouth daily.       . diazepam (VALIUM) 5 MG tablet Take 5 mg by mouth daily.       . GLUMETZA 500 MG 24 hr tablet Take 500 mg by mouth as directed. 2 tablets with every meal      . LANTUS SOLOSTAR 100 UNIT/ML injection       . nabumetone (RELAFEN) 500 MG tablet Take 500 mg by mouth 2 (two) times daily.       . potassium chloride SA (K-DUR,KLOR-CON)  20 MEQ tablet Take 20 mEq by mouth daily.       . rosuvastatin (CRESTOR) 10 MG tablet Take 10 mg by mouth daily. 1/2 tablet daily      . solifenacin (VESICARE) 5 MG tablet Take 5 mg by mouth daily.       . verapamil (CALAN-SR) 120 MG CR tablet Take 120 mg by mouth 2 (two) times daily.       . zolpidem (AMBIEN) 10 MG tablet Take 10 mg by mouth at bedtime.         Allergies  Allergen Reactions  . Heparin   . Penicillins   . Sulfonamide Derivatives     History   Social History  . Marital Status: Married    Spouse Name: N/A    Number of Children: N/A  . Years of Education: N/A   Occupational History  . Retired-CPA. Former CEO [employer]    Social History Main Topics  . Smoking status: Former Smoker -- 0.5 packs/day for 10 years    Types: Cigarettes    Quit date: 02/05/1972  . Smokeless tobacco: Never Used  . Alcohol Use: 3.5   oz/week    7 drink(s) per week  . Drug Use: No  . Sexually Active: Not on file   Other Topics Concern  . Not on file   Social History Narrative  . No narrative on file    Family History  Problem Relation Age of Onset  . Heart attack Father 63  . Heart failure Mother     Review of Systems:  As stated in the HPI and otherwise negative.   BP 177/73  Pulse 54  Ht 6' 2" (1.88 m)  Wt 203 lb (92.08 kg)  BMI 26.06 kg/m2  SpO2 95%  Physical Examination: General: Well developed, well nourished, NAD HEENT: OP clear, mucus membranes moist SKIN: warm, dry. No rashes. Neuro: No focal deficits Musculoskeletal: Muscle strength 5/5 all ext Psychiatric: Mood and affect normal Neck: No JVD, no carotid bruits, no thyromegaly, no lymphadenopathy. Lungs:Clear bilaterally, no wheezes, rhonci, crackles Cardiovascular: Regular rate and rhythm. No murmurs, gallops or rubs. Abdomen:Soft. Bowel sounds present. Non-tender.  Extremities: No lower extremity edema. Pulses are 2 + in the bilateral DP/PT.  EKG: Sinus brady, rate 54 bpm. 1st degree AV block.  LAFB. LVH. T wave inversions inferior and lateral leads. Poor R wave progression precordial leads.   Echo: 02/18/12: Left ventricle: The cavity size was normal. Wall thickness was increased in a pattern of moderate LVH. Systolic function was normal. The estimated ejection fraction was in the range of 60% to 65%. Wall motion was normal; there were no regional wall motion abnormalities. There was an increased relative contribution of atrial contraction to ventricular filling. Doppler parameters are consistent with abnormal left ventricular relaxation (grade 1 diastolic dysfunction). - Aortic valve: Trivial regurgitation. - Left atrium: The atrium was mildly dilated. - Atrial septum: No defect or patent foramen ovale was identified. - Pulmonary arteries: Systolic pressure was mildly increased. PA peak pressure: 40mm Hg (S).   Stress myoview: 02/23/12:  Stress Procedure: The patient received IV Lexiscan 0.4 mg over 15-seconds. Technetium 99m Tetrofosmin injected at 30-seconds. There were no significant changes with Lexiscan. Quantitative spect images were obtained after a 45 minute delay.  New EKG changes were reviewed with Dr. Chontel Warning prior to stress. EKG's and stress images were also reviewed by Dr. Alissah Redmon.  Stress ECG: No significant change from baseline ECG  QPS  Raw Data Images: Acquisition technically good; normal left ventricular size.  Stress Images: Normal homogeneous uptake in all areas of the myocardium.  Rest Images: Normal homogeneous uptake in all areas of the myocardium.  Subtraction (SDS): No evidence of ischemia.  Transient Ischemic Dilatation (Normal <1.22): 0.87  Lung/Heart Ratio (Normal <0.45): 0.35  Quantitative Gated Spect Images  QGS EDV: 105 ml  QGS ESV: 38 ml  Impression  Exercise Capacity: Lexiscan with no exercise.  BP Response: Normal blood pressure response.  Clinical Symptoms: No chest pain or dyspnea.  ECG Impression: No significant ST segment change  suggestive of ischemia.  Comparison with Prior Nuclear Study: No previous nuclear study performed  Overall Impression: Normal stress nuclear study.  LV Ejection Fraction: 64%. LV Wall Motion: NL LV Function; NL Wall Motion   Assessment and Plan:   1. CAD: He has recent worsening of fatigue and dyspnea. He can barely walk without becoming very dyspneic. Stress myoview without any large areas of ischemic but he is known to have multi-vessel CAD by cath in 2009. My concern is that there is progression of his CAD and possibly balanced ischemia on the myoview. EKG is concerning with   new T wave inversions. I think a cardiac cath is indicated to exclude progression of his CAD. He is agreeable to proceed. Risks and benefits reviewed today. Will get pre-cath labs next week. Will arrange cath for 03/04/12 in the main lab at [facility]. Continue ASA/statin/ARB. Will give NTG SL prn.  

## 2012-02-25 ENCOUNTER — Encounter (HOSPITAL_COMMUNITY): Payer: Self-pay | Admitting: Respiratory Therapy

## 2012-02-27 NOTE — Telephone Encounter (Signed)
Pt has picked up instructions.

## 2012-03-01 ENCOUNTER — Other Ambulatory Visit (INDEPENDENT_AMBULATORY_CARE_PROVIDER_SITE_OTHER): Payer: BC Managed Care – PPO

## 2012-03-01 DIAGNOSIS — I2510 Atherosclerotic heart disease of native coronary artery without angina pectoris: Secondary | ICD-10-CM

## 2012-03-01 LAB — CBC WITH DIFFERENTIAL/PLATELET
Basophils Absolute: 0.1 10*3/uL (ref 0.0–0.1)
Eosinophils Relative: 3.1 % (ref 0.0–5.0)
Lymphs Abs: 1.1 10*3/uL (ref 0.7–4.0)
MCV: 96 fl (ref 78.0–100.0)
Monocytes Absolute: 0.4 10*3/uL (ref 0.1–1.0)
Neutrophils Relative %: 59.8 % (ref 43.0–77.0)
Platelets: 181 10*3/uL (ref 150.0–400.0)
RDW: 13.5 % (ref 11.5–14.6)
WBC: 4.4 10*3/uL — ABNORMAL LOW (ref 4.5–10.5)

## 2012-03-01 LAB — BASIC METABOLIC PANEL
Calcium: 8.6 mg/dL (ref 8.4–10.5)
GFR: 89.85 mL/min (ref >60.00)
Glucose, Bld: 202 mg/dL — ABNORMAL HIGH (ref 70–99)
Potassium: 4.1 mEq/L (ref 3.5–5.1)
Sodium: 139 mEq/L (ref 135–145)

## 2012-03-01 LAB — PROTIME-INR: INR: 1.1 ratio — ABNORMAL HIGH (ref 0.8–1.0)

## 2012-03-04 ENCOUNTER — Ambulatory Visit (HOSPITAL_COMMUNITY)
Admission: RE | Admit: 2012-03-04 | Discharge: 2012-03-04 | Disposition: A | Discharge status: Home / Self Care | Payer: BC Managed Care – PPO | Source: Ambulatory Visit | Attending: Cardiovascular Disease | Admitting: Cardiovascular Disease

## 2012-03-04 ENCOUNTER — Encounter (HOSPITAL_COMMUNITY)
Admission: RE | Disposition: A | Discharge status: Home / Self Care | Payer: Self-pay | Source: Ambulatory Visit | Attending: Cardiovascular Disease

## 2012-03-04 DIAGNOSIS — I2510 Atherosclerotic heart disease of native coronary artery without angina pectoris: Secondary | ICD-10-CM | POA: Insufficient documentation

## 2012-03-04 DIAGNOSIS — E1149 Type 2 diabetes mellitus with other diabetic neurological complication: Secondary | ICD-10-CM | POA: Insufficient documentation

## 2012-03-04 DIAGNOSIS — E1142 Type 2 diabetes mellitus with diabetic polyneuropathy: Secondary | ICD-10-CM | POA: Insufficient documentation

## 2012-03-04 DIAGNOSIS — E785 Hyperlipidemia, unspecified: Secondary | ICD-10-CM | POA: Insufficient documentation

## 2012-03-04 DIAGNOSIS — Z9861 Coronary angioplasty status: Secondary | ICD-10-CM | POA: Insufficient documentation

## 2012-03-04 DIAGNOSIS — I10 Essential (primary) hypertension: Secondary | ICD-10-CM | POA: Insufficient documentation

## 2012-03-04 HISTORY — PX: LEFT HEART CATHETERIZATION WITH CORONARY ANGIOGRAM: SHX5451

## 2012-03-04 LAB — GLUCOSE, CAPILLARY: Glucose-Capillary: 143 mg/dL — ABNORMAL HIGH (ref 70–99)

## 2012-03-04 SURGERY — LEFT HEART CATHETERIZATION WITH CORONARY ANGIOGRAM
Anesthesia: LOCAL

## 2012-03-04 MED ORDER — ACETAMINOPHEN 325 MG PO TABS
ORAL_TABLET | ORAL | Status: AC
  Filled 2012-03-04: qty 2

## 2012-03-04 MED ORDER — SODIUM CHLORIDE 0.9 % IJ SOLN: 3.0000 mL | Freq: Two times a day (BID) | INTRAMUSCULAR | Status: DC

## 2012-03-04 MED ORDER — ACETAMINOPHEN 325 MG PO TABS
650.0000 mg | ORAL_TABLET | ORAL | Status: DC | PRN
  Administered 2012-03-04: 650 mg via ORAL

## 2012-03-04 MED ORDER — SODIUM CHLORIDE 0.9 % IJ SOLN: 3.0000 mL | INTRAMUSCULAR | Status: DC | PRN

## 2012-03-04 MED ORDER — SODIUM CHLORIDE 0.9 % IV SOLN
INTRAVENOUS | Status: DC
  Administered 2012-03-04: 1000 mL via INTRAVENOUS

## 2012-03-04 MED ORDER — NITROGLYCERIN 0.2 MG/ML ON CALL CATH LAB
INTRAVENOUS | Status: AC
  Filled 2012-03-04: qty 1

## 2012-03-04 MED ORDER — MIDAZOLAM HCL 2 MG/2ML IJ SOLN
INTRAMUSCULAR | Status: AC
  Filled 2012-03-04: qty 2

## 2012-03-04 MED ORDER — LIDOCAINE HCL (PF) 1 % IJ SOLN
INTRAMUSCULAR | Status: AC
  Filled 2012-03-04: qty 30

## 2012-03-04 MED ORDER — ASPIRIN 81 MG PO CHEW: 324.0000 mg | CHEWABLE_TABLET | ORAL | Status: DC

## 2012-03-04 MED ORDER — DIAZEPAM 5 MG PO TABS
ORAL_TABLET | ORAL | Status: AC
  Filled 2012-03-04: qty 1

## 2012-03-04 MED ORDER — HYDRALAZINE HCL 20 MG/ML IJ SOLN
INTRAMUSCULAR | Status: AC
  Filled 2012-03-04: qty 1

## 2012-03-04 MED ORDER — ONDANSETRON HCL 4 MG/2ML IJ SOLN: 4.0000 mg | Freq: Four times a day (QID) | INTRAMUSCULAR | Status: DC | PRN

## 2012-03-04 MED ORDER — SODIUM CHLORIDE 0.9 % IV SOLN
250.0000 mL | INTRAVENOUS | Status: DC | PRN
Start: 2012-03-04 — End: 2012-03-04

## 2012-03-04 MED ORDER — DIAZEPAM 5 MG PO TABS
5.0000 mg | ORAL_TABLET | ORAL | Status: AC
  Administered 2012-03-04: 5 mg via ORAL

## 2012-03-04 MED ORDER — ASPIRIN 81 MG PO CHEW
CHEWABLE_TABLET | ORAL | Status: AC
  Filled 2012-03-04: qty 4

## 2012-03-04 MED ORDER — SODIUM CHLORIDE 0.9 % IV SOLN: INTRAVENOUS | Status: AC

## 2012-03-04 MED ORDER — FENTANYL CITRATE 0.05 MG/ML IJ SOLN
INTRAMUSCULAR | Status: AC
  Filled 2012-03-04: qty 2

## 2012-03-04 MED ORDER — HEPARIN (PORCINE) IN NACL 2-0.9 UNIT/ML-% IJ SOLN
INTRAMUSCULAR | Status: AC
  Filled 2012-03-04: qty 2000

## 2012-03-04 NOTE — H&P (View-Only) (Signed)
History of Present Illness: 76 yo male with history of CAD, HTN, HLD here today for cardiac follow up. He has been followed in the past by Dr. Juanda Chance. I saw him for the first time 02/18/12. He underwent stenting of the right coronary artery in March 2009 with a 3.5 Liberte bare-metal stent. He had c/o SOB and fatigue at his visit last week. I arranged a stress myoview which showed no large areas of ischemia but he has been noted to have evolution of his T wave inversions since last visit. This was a [employer] since he cannot walk on the treadmill. Echo with preserved LV function and no significant valvular issues.   He is back today for f/u. He continues to have severe dyspnea with exertion. He has no chest pain but he is profoundly fatigued. He can only walk short distance before he feels dyspnea and fatigue. This is similar to his presentation before he had his RCA stent placed in 2009.   Primary Care Physician: Dr. Eula Listen [facility])   Last Lipid Profile: followed in primary care  Past Medical History  Diagnosis Date  . CAD (coronary artery disease)     Bare meta stent RCA  . HTN (hypertension)   . Hyperlipidemia   . Diabetes mellitus   . Peripheral neuropathy     Past Surgical History  Procedure Date  . Tonsillectomy     Current Outpatient Prescriptions  Medication Sig Dispense Refill  . allopurinol (ZYLOPRIM) 300 MG tablet Take 300 mg by mouth daily.       Marland Kitchen aspirin 325 MG tablet Take 325 mg by mouth daily.      Marland Kitchen BENICAR 40 MG tablet Take 20 mg by mouth daily.       . citalopram (CELEXA) 40 MG tablet Take 40 mg by mouth daily.       . diazepam (VALIUM) 5 MG tablet Take 5 mg by mouth daily.       Marland Kitchen GLUMETZA 500 MG 24 hr tablet Take 500 mg by mouth as directed. 2 tablets with every meal      . LANTUS SOLOSTAR 100 UNIT/ML injection       . nabumetone (RELAFEN) 500 MG tablet Take 500 mg by mouth 2 (two) times daily.       . potassium chloride SA (K-DUR,KLOR-CON)  20 MEQ tablet Take 20 mEq by mouth daily.       . rosuvastatin (CRESTOR) 10 MG tablet Take 10 mg by mouth daily. 1/2 tablet daily      . solifenacin (VESICARE) 5 MG tablet Take 5 mg by mouth daily.       . verapamil (CALAN-SR) 120 MG CR tablet Take 120 mg by mouth 2 (two) times daily.       Marland Kitchen zolpidem (AMBIEN) 10 MG tablet Take 10 mg by mouth at bedtime.         Allergies  Allergen Reactions  . Heparin   . Penicillins   . Sulfonamide Derivatives     History   Social History  . Marital Status: Married    Spouse Name: N/A    Number of Children: N/A  . Years of Education: N/A   Occupational History  . Retired-CPA. Former Human resources officer    Social History Main Topics  . Smoking status: Former Smoker -- 0.5 packs/day for 10 years    Types: Cigarettes    Quit date: 02/05/1972  . Smokeless tobacco: Never Used  . Alcohol Use: 3.5  oz/week    7 drink(s) per week  . Drug Use: No  . Sexually Active: Not on file   Other Topics Concern  . Not on file   Social History Narrative  . No narrative on file    Family History  Problem Relation Age of Onset  . Heart attack Father 43  . Heart failure Mother     Review of Systems:  As stated in the HPI and otherwise negative.   BP 177/73  Pulse 54  Ht 6\' 2"  (1.88 m)  Wt 203 lb (92.08 kg)  BMI 26.06 kg/m2  SpO2 95%  Physical Examination: General: Well developed, well nourished, NAD HEENT: OP clear, mucus membranes moist SKIN: warm, dry. No rashes. Neuro: No focal deficits Musculoskeletal: Muscle strength 5/5 all ext Psychiatric: Mood and affect normal Neck: No JVD, no carotid bruits, no thyromegaly, no lymphadenopathy. Lungs:Clear bilaterally, no wheezes, rhonci, crackles Cardiovascular: Regular rate and rhythm. No murmurs, gallops or rubs. Abdomen:Soft. Bowel sounds present. Non-tender.  Extremities: No lower extremity edema. Pulses are 2 + in the bilateral DP/PT.  EKG: Sinus brady, rate 54 bpm. 1st degree AV block.  LAFB. LVH. T wave inversions inferior and lateral leads. Poor R wave progression precordial leads.   Echo: 02/18/12: Left ventricle: The cavity size was normal. Wall thickness was increased in a pattern of moderate LVH. Systolic function was normal. The estimated ejection fraction was in the range of 60% to 65%. Wall motion was normal; there were no regional wall motion abnormalities. There was an increased relative contribution of atrial contraction to ventricular filling. Doppler parameters are consistent with abnormal left ventricular relaxation (grade 1 diastolic dysfunction). - Aortic valve: Trivial regurgitation. - Left atrium: The atrium was mildly dilated. - Atrial septum: No defect or patent foramen ovale was identified. - Pulmonary arteries: Systolic pressure was mildly increased. PA peak pressure: 40mm Hg (S).   Stress myoview: 02/23/12:  Stress Procedure: The patient received IV Lexiscan 0.4 mg over 15-seconds. Technetium 81m Tetrofosmin injected at 30-seconds. There were no significant changes with Lexiscan. Quantitative spect images were obtained after a 45 minute delay.  New EKG changes were reviewed with Dr. Clifton James prior to stress. EKG's and stress images were also reviewed by Dr. Clifton James.  Stress ECG: No significant change from baseline ECG  QPS  Raw Data Images: Acquisition technically good; normal left ventricular size.  Stress Images: Normal homogeneous uptake in all areas of the myocardium.  Rest Images: Normal homogeneous uptake in all areas of the myocardium.  Subtraction (SDS): No evidence of ischemia.  Transient Ischemic Dilatation (Normal <1.22): 0.87  Lung/Heart Ratio (Normal <0.45): 0.35  Quantitative Gated Spect Images  QGS EDV: 105 ml  QGS ESV: 38 ml  Impression  Exercise Capacity: Lexiscan with no exercise.  BP Response: Normal blood pressure response.  Clinical Symptoms: No chest pain or dyspnea.  ECG Impression: No significant ST segment change  suggestive of ischemia.  Comparison with Prior Nuclear Study: No previous nuclear study performed  Overall Impression: Normal stress nuclear study.  LV Ejection Fraction: 64%. LV Wall Motion: NL LV Function; NL Wall Motion   Assessment and Plan:   1. CAD: He has recent worsening of fatigue and dyspnea. He can barely walk without becoming very dyspneic. Stress myoview without any large areas of ischemic but he is known to have multi-vessel CAD by cath in 2009. My concern is that there is progression of his CAD and possibly balanced ischemia on the myoview. EKG is concerning with  new T wave inversions. I think a cardiac cath is indicated to exclude progression of his CAD. He is agreeable to proceed. Risks and benefits reviewed today. Will get pre-cath labs next week. Will arrange cath for 03/04/12 in the main lab at [facility]. Continue ASA/statin/ARB. Will give NTG SL prn.

## 2012-03-04 NOTE — Progress Notes (Signed)
Pt walked in hallway without difficulty. Right groin remains CDI/stable. VSS.

## 2012-03-04 NOTE — CV Procedure (Signed)
   Cardiac Catheterization Operative Report  [name] [id number] 8/29/20137:55 AM [name], MD  Procedure Performed:  1. Left Heart Catheterization 2. Selective Coronary Angiography 3. Left ventricular angiogram  Operator: Verne Carrow, MD  HEPARIN ALLERGY-UNKNOWN REACTION  Indication:  Known CAD with moderate LAD disease by cath in 2009, bare metal stent RCA 2009, recent c/o dyspnea/fatigue. His stress myoview showed no ischemia but has has evolution of his EKG with T wave inversions. Given his history and the fact that he was noted to have a 50-70% LAD lesion in 2009, I felt that cardiac cath was indicated to exclude progression of CAD.                               Procedure Details: The risks, benefits, complications, treatment options, and expected outcomes were discussed with the patient. The patient and/or family concurred with the proposed plan, giving informed consent. The patient was brought to the cath lab after IV hydration was begun and oral premedication was given. The patient was further sedated with Versed and Fentanyl. The right groin was prepped and draped in the usual manner. Using the modified Seldinger access technique, a 5 French sheath was placed in the right femoral artery. Standard diagnostic catheters were used to perform selective coronary angiography. A pigtail catheter was used to perform a left ventricular angiogram. Of note, we did not use heparin because of reported "Heparin Allergy" although he does not recall what happened.   There were no immediate complications. The patient was taken to the recovery area in stable condition.   Hemodynamic Findings: Central aortic pressure: 179/69 Left ventricular pressure: 175/7/13  Angiographic Findings:  Left main:  No disease noted.   Left Anterior Descending Artery: Large caliber vessel that courses to the apex. There is calcification in the proximal and mid vessel. The proximal vessel has a  50% eccentric stenosis. The mid vessel has a long, smooth 50% stenosis. Moderate sized vessel with 40% proximal stenosis.   Ramus Intermediate: Moderate sized vessel with mild plaque disease.   Circumflex Artery: Large caliber vessel with mild plaque. The first OM branch is very small. The second OM branch is small with a 30% ostial stenosis.   Right Coronary Artery: Large, dominant vessel with 40% proximal stenosis. The mid vessel has a patent stent with minimal in-stent restenosis. The distal vessel has a 30% stenosis. The PDA and PLA are patent mild diffuse plaque disease.   Left Ventricular Angiogram: LVEF 55%.   Impression: 1. Double vessel CAD with patent stent mid RCA 2. Preserved LV systolic function  Recommendations: Continue medical management.        Complications:  None. The patient tolerated the procedure well.

## 2012-03-04 NOTE — Interval H&P Note (Signed)
History and Physical Interval Note:  03/04/2012 7:15 AM  [name]  has presented today for surgery, with the diagnosis of c/p  The various methods of treatment have been discussed with the patient and family. After consideration of risks, benefits and other options for treatment, the patient has consented to  Procedure(s) (LRB): LEFT HEART CATHETERIZATION WITH CORONARY ANGIOGRAM (N/A) as a surgical intervention .  The patient's history has been reviewed, patient examined, no change in status, stable for surgery.  I have reviewed the patient's chart and labs.  Questions were answered to the patient's satisfaction.     [name]

## 2012-03-18 ENCOUNTER — Ambulatory Visit: Payer: BC Managed Care – PPO | Admitting: Cardiovascular Disease

## 2012-04-01 ENCOUNTER — Ambulatory Visit (INDEPENDENT_AMBULATORY_CARE_PROVIDER_SITE_OTHER): Payer: BC Managed Care – PPO | Admitting: Cardiovascular Disease

## 2012-04-01 ENCOUNTER — Encounter: Payer: Self-pay | Admitting: Cardiovascular Disease

## 2012-04-01 VITALS — BP 151/87 | HR 66 | Ht 74.0 in | Wt 201.0 lb

## 2012-04-01 DIAGNOSIS — I10 Essential (primary) hypertension: Secondary | ICD-10-CM

## 2012-04-01 DIAGNOSIS — I2510 Atherosclerotic heart disease of native coronary artery without angina pectoris: Secondary | ICD-10-CM

## 2012-04-01 MED ORDER — HYDROCHLOROTHIAZIDE 25 MG PO TABS: 25.0000 mg | ORAL_TABLET | Freq: Every day | ORAL | Status: DC

## 2012-04-01 NOTE — Progress Notes (Signed)
History of Present Illness: 76 yo male with history of CAD, HTN, HLD here today for cardiac follow up. He has been followed in the past by Dr. Juanda Chance. I saw him for the first time 02/18/12. He underwent stenting of the right coronary artery in March 2009 with a 3.5 Liberte bare-metal stent. He had c/o SOB and fatigue at his first visit with me.  I arranged a stress myoview which showed no large areas of ischemia but he has been noted to have evolution of his T wave inversions since last visit. This was a [employer] since he cannot walk on the treadmill. Echo with preserved LV function and no significant valvular issues. Since he had T wave inversions that were worrisome, cardiac cath was arranged and showed stable disease with patent stent RCA.  He is here today for follow up. No complaints    Primary Care Physician: Dr. Eula Listen [facility])   Last Lipid Profile: followed in primary care    Past Medical History  Diagnosis Date  . CAD (coronary artery disease)     Bare meta stent RCA  . HTN (hypertension)   . Hyperlipidemia   . Diabetes mellitus   . Peripheral neuropathy     Past Surgical History  Procedure Date  . Tonsillectomy     Current Outpatient Prescriptions  Medication Sig Dispense Refill  . allopurinol (ZYLOPRIM) 300 MG tablet Take 300 mg by mouth daily.       Marland Kitchen aspirin 325 MG tablet Take 325 mg by mouth daily.      . citalopram (CELEXA) 40 MG tablet Take 40 mg by mouth daily.       . diazepam (VALIUM) 5 MG tablet Take 5 mg by mouth daily as needed.       Marland Kitchen GLUMETZA 500 MG 24 hr tablet Take 500 mg by mouth every evening.       Marland Kitchen LANTUS SOLOSTAR 100 UNIT/ML injection Inject 28 Units into the skin every morning.       Marland Kitchen levothyroxine (SYNTHROID, LEVOTHROID) 25 MCG tablet Take 25 mcg by mouth daily.      . nabumetone (RELAFEN) 500 MG tablet Take 500 mg by mouth 2 (two) times daily.       . nitroGLYCERIN (NITROSTAT) 0.4 MG SL tablet Place 1 tablet (0.4 mg  total) under the tongue every 5 (five) minutes as needed for chest pain.  25 tablet  6  . potassium chloride SA (K-DUR,KLOR-CON) 20 MEQ tablet Take 20 mEq by mouth daily.       . rosuvastatin (CRESTOR) 10 MG tablet Take 5 mg by mouth daily.       . solifenacin (VESICARE) 5 MG tablet Take 5 mg by mouth daily.       . verapamil (CALAN-SR) 120 MG CR tablet Take 120 mg by mouth 2 (two) times daily.       Marland Kitchen zolpidem (AMBIEN) 10 MG tablet Take 5 mg by mouth at bedtime. For sleep      . BENICAR 40 MG tablet Take 20 mg by mouth daily.         Allergies  Allergen Reactions  . Heparin   . Penicillins   . Sulfonamide Derivatives     History   Social History  . Marital Status: Married    Spouse Name: N/A    Number of Children: N/A  . Years of Education: N/A   Occupational History  . Retired-CPA. Former Teacher, English as a foreign language [employer]    Social  History Main Topics  . Smoking status: Former Smoker -- 0.5 packs/day for 10 years    Types: Cigarettes    Quit date: 02/05/1972  . Smokeless tobacco: Never Used  . Alcohol Use: 3.5 oz/week    7 drink(s) per week  . Drug Use: No  . Sexually Active: Not on file   Other Topics Concern  . Not on file   Social History Narrative  . No narrative on file    Family History  Problem Relation Age of Onset  . Heart attack Father 40  . Heart failure Mother     Review of Systems:  As stated in the HPI and otherwise negative.   BP 151/87  Pulse 66  Ht 6\' 2"  (1.88 m)  Wt 201 lb (91.173 kg)  BMI 25.81 kg/m2  Physical Examination: General: Well developed, well nourished, NAD HEENT: OP clear, mucus membranes moist SKIN: warm, dry. No rashes. Neuro: No focal deficits Musculoskeletal: Muscle strength 5/5 all ext Psychiatric: Mood and affect normal Neck: No JVD, no carotid bruits, no thyromegaly, no lymphadenopathy. Lungs:Clear bilaterally, no wheezes, rhonci, crackles Cardiovascular: Regular rate and rhythm. No murmurs, gallops or rubs. Abdomen:Soft.  Bowel sounds present. Non-tender.  Extremities: No lower extremity edema. Pulses are 2 + in the bilateral DP/PT.  Cardiac cath; 03/04/12:  Left main: No disease noted.  Left Anterior Descending Artery: Large caliber vessel that courses to the apex. There is calcification in the proximal and mid vessel. The proximal vessel has a 50% eccentric stenosis. The mid vessel has a long, smooth 50% stenosis. Moderate sized vessel with 40% proximal stenosis.  Ramus Intermediate: Moderate sized vessel with mild plaque disease.  Circumflex Artery: Large caliber vessel with mild plaque. The first OM branch is very small. The second OM branch is small with a 30% ostial stenosis.  Right Coronary Artery: Large, dominant vessel with 40% proximal stenosis. The mid vessel has a patent stent with minimal in-stent restenosis. The distal vessel has a 30% stenosis. The PDA and PLA are patent mild diffuse plaque disease.  Left Ventricular Angiogram: LVEF 55%.    Assessment and Plan:   1. CAD: Stable. Recent cath with stable CAD. Continue medical therapy.   2. HTN:  BP not well controlled. Will add HCTZ to current meds. Follow up in primary care.

## 2012-04-01 NOTE — Patient Instructions (Addendum)
Your physician wants you to follow-up in:  6 months. You will receive a reminder letter in the mail two months in advance. If you don't receive a letter, please call our office to schedule the follow-up appointment.   Your physician has recommended you make the following change in your medication:  Start hydrochlorothiazide 25 mg by mouth daily.  Your physician recommends that you return for lab work on Apr 09, 2012

## 2012-04-09 ENCOUNTER — Other Ambulatory Visit (INDEPENDENT_AMBULATORY_CARE_PROVIDER_SITE_OTHER): Payer: BC Managed Care – PPO

## 2012-04-09 DIAGNOSIS — I10 Essential (primary) hypertension: Secondary | ICD-10-CM

## 2012-04-09 LAB — BASIC METABOLIC PANEL
BUN: 20 mg/dL (ref 6–23)
Chloride: 103 mEq/L (ref 96–112)
Glucose, Bld: 215 mg/dL — ABNORMAL HIGH (ref 70–99)
Potassium: 3.9 mEq/L (ref 3.5–5.1)

## 2012-09-15 ENCOUNTER — Ambulatory Visit (INDEPENDENT_AMBULATORY_CARE_PROVIDER_SITE_OTHER): Payer: Managed Care, Other (non HMO) | Admitting: Cardiovascular Disease

## 2012-09-15 ENCOUNTER — Encounter: Payer: Self-pay | Admitting: Cardiovascular Disease

## 2012-09-15 VITALS — BP 135/68 | HR 59 | Ht 74.0 in | Wt 198.0 lb

## 2012-09-15 DIAGNOSIS — I2510 Atherosclerotic heart disease of native coronary artery without angina pectoris: Secondary | ICD-10-CM

## 2012-09-15 MED ORDER — ASPIRIN 81 MG PO TABS: 81.0000 mg | ORAL_TABLET | Freq: Every day | ORAL | Status: AC

## 2012-09-15 NOTE — Progress Notes (Signed)
History of Present Illness: 77 yo male with history of CAD, HTN, HLD here today for cardiac follow up. He has been followed in the past by Dr. Juanda Chance. I saw him for the first time 02/18/12. He underwent stenting of the right coronary artery in March 2009 with a 3.5 Liberte bare-metal stent. He had c/o SOB and fatigue at his first visit with me. I arranged a stress myoview which showed no large areas of ischemia but he has been noted to have evolution of his T wave inversions since last visit. This was a [employer] since he cannot walk on the treadmill. Echo with preserved LV function and no significant valvular issues. Since he had T wave inversions that were worrisome, cardiac cath was arranged and showed stable disease with patent stent RCA.   He is here today for follow up. No complaints   Primary Care Physician: Dr. Eula Listen [facility])   Last Lipid Profile: followed in primary care     Past Medical History  Diagnosis Date  . CAD (coronary artery disease)     Bare meta stent RCA  . HTN (hypertension)   . Hyperlipidemia   . Diabetes mellitus   . Peripheral neuropathy     Past Surgical History  Procedure Laterality Date  . Tonsillectomy      Current Outpatient Prescriptions  Medication Sig Dispense Refill  . allopurinol (ZYLOPRIM) 300 MG tablet Take 300 mg by mouth daily.       Marland Kitchen aspirin 325 MG tablet Take 325 mg by mouth daily.      Marland Kitchen BENICAR 40 MG tablet Take 20 mg by mouth daily.       . calcium-vitamin D (OSCAL WITH D) 500-200 MG-UNIT per tablet Take 1 tablet by mouth daily. Vitamin D 3  daily      . citalopram (CELEXA) 40 MG tablet Take 40 mg by mouth daily.       . Coenzyme Q10 (CO Q 10 PO) Take by mouth. 1 tab daily      . diazepam (VALIUM) 5 MG tablet Take 5 mg by mouth daily as needed.       . diphenoxylate-atropine (LOMOTIL) 2.5-0.025 MG per tablet Take 1 tablet by mouth 4 (four) times daily as needed for diarrhea or loose stools.      Marland Kitchen GLUMETZA 500  MG 24 hr tablet Take 500 mg by mouth every evening.       . hydrochlorothiazide (HYDRODIURIL) 25 MG tablet Take 1 tablet (25 mg total) by mouth daily.  30 tablet  6  . LANTUS SOLOSTAR 100 UNIT/ML injection Inject 54 Units into the skin every morning.       Marland Kitchen levothyroxine (SYNTHROID, LEVOTHROID) 25 MCG tablet Take 25 mcg by mouth daily.      . nabumetone (RELAFEN) 500 MG tablet Take 500 mg by mouth 2 (two) times daily.       . nitroGLYCERIN (NITROSTAT) 0.4 MG SL tablet Place 1 tablet (0.4 mg total) under the tongue every 5 (five) minutes as needed for chest pain.  25 tablet  6  . potassium chloride SA (K-DUR,KLOR-CON) 20 MEQ tablet Take 20 mEq by mouth daily.       . ranitidine (ZANTAC) 150 MG capsule Pt takes 1 tablet as needed      . rosuvastatin (CRESTOR) 10 MG tablet Take 10 mg by mouth daily.       . solifenacin (VESICARE) 5 MG tablet Take 5 mg by mouth daily.       Marland Kitchen  verapamil (CALAN-SR) 120 MG CR tablet Take 120 mg by mouth 2 (two) times daily.       Marland Kitchen zolpidem (AMBIEN) 10 MG tablet Take 5 mg by mouth at bedtime. For sleep       No current facility-administered medications for this visit.    Allergies  Allergen Reactions  . Heparin   . Penicillins   . Sulfonamide Derivatives     History   Social History  . Marital Status: Married    Spouse Name: N/A    Number of Children: N/A  . Years of Education: N/A   Occupational History  . Retired-CPA. Former Human resources officer    Social History Main Topics  . Smoking status: Former Smoker -- 0.50 packs/day for 10 years    Types: Cigarettes    Quit date: 02/05/1972  . Smokeless tobacco: Never Used  . Alcohol Use: 3.5 oz/week    7 drink(s) per week  . Drug Use: No  . Sexually Active: Not on file   Other Topics Concern  . Not on file   Social History Narrative  . No narrative on file    Family History  Problem Relation Age of Onset  . Heart attack Father 41  . Heart failure Mother     Review of Systems:  As stated  in the HPI and otherwise negative.   BP 135/68  Pulse 59  Ht 6\' 2"  (1.88 m)  Wt 198 lb (89.812 kg)  BMI 25.41 kg/m2  Physical Examination: General: Well developed, well nourished, NAD HEENT: OP clear, mucus membranes moist SKIN: warm, dry. No rashes. Neuro: No focal deficits Musculoskeletal: Muscle strength 5/5 all ext Psychiatric: Mood and affect normal Neck: No JVD, no carotid bruits, no thyromegaly, no lymphadenopathy. Lungs:Clear bilaterally, no wheezes, rhonci, crackles Cardiovascular: Regular rate and rhythm. No murmurs, gallops or rubs. Abdomen:Soft. Bowel sounds present. Non-tender.  Extremities: No lower extremity edema. Pulses are 2 + in the bilateral DP/PT.  Cardiac cath; 03/04/12:  Left main: No disease noted.  Left Anterior Descending Artery: Large caliber vessel that courses to the apex. There is calcification in the proximal and mid vessel. The proximal vessel has a 50% eccentric stenosis. The mid vessel has a long, smooth 50% stenosis. Moderate sized vessel with 40% proximal stenosis.  Ramus Intermediate: Moderate sized vessel with mild plaque disease.  Circumflex Artery: Large caliber vessel with mild plaque. The first OM branch is very small. The second OM branch is small with a 30% ostial stenosis.  Right Coronary Artery: Large, dominant vessel with 40% proximal stenosis. The mid vessel has a patent stent with minimal in-stent restenosis. The distal vessel has a 30% stenosis. The PDA and PLA are patent mild diffuse plaque disease.  Left Ventricular Angiogram: LVEF 55%.   Assessment and Plan:   1. CAD: Stable. Recent cath with stable moderate CAD. Continue medical therapy. Will lower ASA dose to 81 mg po Qdaily. Will continue statin.   2. HTN: BP not well controlled. Will add HCTZ to current meds. Follow up in primary care.

## 2012-09-15 NOTE — Patient Instructions (Signed)
Your physician wants you to follow-up in:  6 months. You will receive a reminder letter in the mail two months in advance. If you don't receive a letter, please call our office to schedule the follow-up appointment.  Your physician has recommended you make the following change in your medication:  Decrease aspirin to 81 mg by mouth daily    

## 2012-11-09 ENCOUNTER — Other Ambulatory Visit: Payer: Self-pay | Admitting: Cardiovascular Disease

## 2013-03-31 ENCOUNTER — Telehealth: Payer: Self-pay | Admitting: Cardiovascular Disease

## 2013-03-31 NOTE — Telephone Encounter (Signed)
New Problem:  Pt states he was seen by his primary care doc and had an EKG done.. Pt states his EKG showed he had a mild heart attack. Pt would like to be advised on what he should do.

## 2013-03-31 NOTE — Telephone Encounter (Signed)
EKG is unchanged from 8/13. Last cath 2013 with stable CAD. If he is feeling poorly, I can see him sooner. cdm

## 2013-03-31 NOTE — Telephone Encounter (Signed)
Spoke with pt. He reports he saw Dr. Pete Glatter earlier this week for first time. EKG was done and pt reports Dr. Pete Glatter asked him if anyone had told him he had "mild heart attack". Pt reports he had not been told this before. He is not having any chest pain at this time. He states he is weak when doing anything physical. He describes this as feeling tired and short of breath with exertion. No chest pain. Notices it while walking and working in the yard. Shortness of breath goes away with rest. Pt does have NTG but has only used once in the last few months. He thinks NTG was old so he has refilled this. Pt has appt with Dr. Clifton James on April 26, 2013 and I moved this appt up to Sept. 30, 2014 at 11:30. He is aware to be evaluated in ED if symptoms worsen prior to this appt. Pt states Dr. Pete Glatter gave him copy of his EKG. Pt will fax this to our office.

## 2013-03-31 NOTE — Telephone Encounter (Signed)
Spoke with pt's wife and gave her information from Dr. Clifton James. She would like pt to keep scheduled appt on 9/30

## 2013-04-05 ENCOUNTER — Ambulatory Visit (INDEPENDENT_AMBULATORY_CARE_PROVIDER_SITE_OTHER): Payer: Managed Care, Other (non HMO) | Admitting: Cardiovascular Disease

## 2013-04-05 ENCOUNTER — Encounter: Payer: Self-pay | Admitting: Cardiovascular Disease

## 2013-04-05 VITALS — BP 155/83 | HR 63 | Ht 74.0 in | Wt 200.6 lb

## 2013-04-05 DIAGNOSIS — E785 Hyperlipidemia, unspecified: Secondary | ICD-10-CM

## 2013-04-05 DIAGNOSIS — M6281 Muscle weakness (generalized): Secondary | ICD-10-CM

## 2013-04-05 DIAGNOSIS — I10 Essential (primary) hypertension: Secondary | ICD-10-CM

## 2013-04-05 DIAGNOSIS — R42 Dizziness and giddiness: Secondary | ICD-10-CM

## 2013-04-05 DIAGNOSIS — I2510 Atherosclerotic heart disease of native coronary artery without angina pectoris: Secondary | ICD-10-CM

## 2013-04-05 MED ORDER — AMLODIPINE BESYLATE 10 MG PO TABS: 10.0000 mg | ORAL_TABLET | Freq: Every day | ORAL | Status: DC

## 2013-04-05 NOTE — Patient Instructions (Addendum)
Your physician wants you to follow-up in:  3 months.  You will receive a reminder letter in the mail two months in advance. If you don't receive a letter, please call our office to schedule the follow-up appointment.  Your physician has requested that you have a carotid duplex. This test is an ultrasound of the carotid arteries in your neck. It looks at blood flow through these arteries that supply the brain with blood. Allow one hour for this exam. There are no restrictions or special instructions.   Your physician has recommended you make the following change in your medication:  Stop Verapamil. Start Norvasc  10  mg by mouth daily. Hold Crestor for one month.

## 2013-04-05 NOTE — Progress Notes (Addendum)
History of Present Illness: 77 yo male with history of CAD, HTN, HLD here today for cardiac follow up. [name] has been followed in the past by [name]. I saw him for the first time 02/18/12. [name] underwent stenting of the right coronary artery in March 2009 with a 3.5 Liberte bare-metal stent. [name] had c/o SOB and fatigue at his first visit with me. I arranged a Lexiscan stress myoview which showed no large areas of ischemia.  Echo with preserved LV function and no significant valvular issues. Since [name] had T wave inversions that were worrisome, cardiac cath was arranged and showed stable disease with patent stent RCA.   [name] is here today for follow up. [name] has had no chest pain. [name] does c/o dizziness. No dyspnea. Also muscle weakness on statin.   Primary Care Physician: Dr. Eula Listen [facility])   Last Lipid Profile: followed in primary care   Past Medical History  Diagnosis Date  . CAD (coronary artery disease)     Bare meta stent RCA  . HTN (hypertension)   . Hyperlipidemia   . Diabetes mellitus   . Peripheral neuropathy     Past Surgical History  Procedure Laterality Date  . Tonsillectomy      Current Outpatient Prescriptions  Medication Sig Dispense Refill  . allopurinol (ZYLOPRIM) 300 MG tablet Take 300 mg by mouth daily.       Marland Kitchen aspirin 81 MG tablet Take 1 tablet (81 mg total) by mouth daily.  30 tablet  6  . BENICAR 40 MG tablet Take 20 mg by mouth daily.       . calcium-vitamin D (OSCAL WITH D) 500-200 MG-UNIT per tablet Take 1 tablet by mouth daily. Vitamin D 3  daily      . citalopram (CELEXA) 40 MG tablet Take 40 mg by mouth daily.       . Coenzyme Q10 (CO Q 10 PO) Take by mouth. 1 tab daily      . diazepam (VALIUM) 5 MG tablet Take 5 mg by mouth daily as needed.       . diphenoxylate-atropine (LOMOTIL) 2.5-0.025 MG per tablet Take 1 tablet by mouth 4 (four) times daily as needed for diarrhea or loose stools.      Marland Kitchen GLUMETZA 500 MG 24 hr tablet Take 500 mg by mouth  every evening.       . hydrochlorothiazide (HYDRODIURIL) 25 MG tablet TAKE 1 TABLET ONCE DAILY.  30 tablet  6  . LANTUS SOLOSTAR 100 UNIT/ML injection Inject 54 Units into the skin every morning.       Marland Kitchen levothyroxine (SYNTHROID, LEVOTHROID) 25 MCG tablet Take 25 mcg by mouth daily.      . nabumetone (RELAFEN) 500 MG tablet Take 500 mg by mouth 2 (two) times daily.       . nitroGLYCERIN (NITROSTAT) 0.4 MG SL tablet Place 1 tablet (0.4 mg total) under the tongue every 5 (five) minutes as needed for chest pain.  25 tablet  6  . omeprazole (PRILOSEC) 20 MG capsule Take 20 mg by mouth daily.      . potassium chloride SA (K-DUR,KLOR-CON) 20 MEQ tablet Take 20 mEq by mouth daily.       . rosuvastatin (CRESTOR) 10 MG tablet Take 10 mg by mouth daily.       . tamsulosin (FLOMAX) 0.4 MG CAPS capsule Take 0.4 mg by mouth as directed.      . verapamil (CALAN-SR) 120 MG CR tablet Take  120 mg by mouth 2 (two) times daily.       Marland Kitchen zolpidem (AMBIEN) 10 MG tablet Take 5 mg by mouth at bedtime. For sleep       No current facility-administered medications for this visit.    Allergies  Allergen Reactions  . Heparin   . Penicillins   . Sulfonamide Derivatives     History   Social History  . Marital Status: Married    Spouse Name: N/A    Number of Children: N/A  . Years of Education: N/A   Occupational History  . Retired-CPA. Former Human resources officer    Social History Main Topics  . Smoking status: Former Smoker -- 0.50 packs/day for 10 years    Types: Cigarettes    Quit date: 02/05/1972  . Smokeless tobacco: Never Used  . Alcohol Use: 3.5 oz/week    7 drink(s) per week  . Drug Use: No  . Sexual Activity: Not on file   Other Topics Concern  . Not on file   Social History Narrative  . No narrative on file    Family History  Problem Relation Age of Onset  . Heart attack Father 37  . Heart failure Mother     Review of Systems:  As stated in the HPI and otherwise negative.   BP  155/83  Pulse 63  Ht 6\' 2"  (1.88 m)  Wt 200 lb 9.6 oz (90.992 kg)  BMI 25.74 kg/m2  Physical Examination: General: Well developed, well nourished, NAD HEENT: OP clear, mucus membranes moist SKIN: warm, dry. No rashes. Neuro: No focal deficits Musculoskeletal: Muscle strength 5/5 all ext Psychiatric: Mood and affect normal Neck: No JVD, no carotid bruits, no thyromegaly, no lymphadenopathy. Lungs:Clear bilaterally, no wheezes, rhonci, crackles Cardiovascular: Regular rate and rhythm. No murmurs, gallops or rubs. Abdomen:Soft. Bowel sounds present. Non-tender.  Extremities: No lower extremity edema. Pulses are 2 + in the bilateral DP/PT.  Cardiac cath; 03/04/12:  Left main: No disease noted.  Left Anterior Descending Artery: Large caliber vessel that courses to the apex. There is calcification in the proximal and mid vessel. The proximal vessel has a 50% eccentric stenosis. The mid vessel has a long, smooth 50% stenosis.  Ramus Intermediate: Moderate sized vessel with mild plaque disease.  Circumflex Artery: Large caliber vessel with mild plaque. The first OM branch is very small. The second OM branch is small with a 30% ostial stenosis.  Right Coronary Artery: Large, dominant vessel with 40% proximal stenosis. The mid vessel has a patent stent with minimal in-stent restenosis. The distal vessel has a 30% stenosis. The PDA and PLA are patent mild diffuse plaque disease.  Left Ventricular Angiogram: LVEF 55%.   EKG: Sinus brady, rate 59 bpm. 1st degree AV block. LAFB. Poor R wave progression precordial leads (could represent old septal infarct)  Assessment and Plan:   1. CAD: Stable. Recent cath August 2013 with stable moderate CAD. Continue medical therapy. [name] has been on a statin.    2. HTN: BP not well controlled. Will stop Verapamil with recent dizziness, bradycardia and add Norvasc 5 mg po Qdaily.   3. Dizziness: May be secondary to bradycardia. Will stop verapamil and start  Norvasc 10 mg po QDaily. Check carotid artery dopplers.   4. Muscle weakness. Statin holiday for one month to see if this improves.

## 2013-04-06 ENCOUNTER — Ambulatory Visit (HOSPITAL_COMMUNITY): Payer: Managed Care, Other (non HMO) | Attending: Internal Medicine

## 2013-04-06 DIAGNOSIS — I10 Essential (primary) hypertension: Secondary | ICD-10-CM | POA: Insufficient documentation

## 2013-04-06 DIAGNOSIS — I6529 Occlusion and stenosis of unspecified carotid artery: Secondary | ICD-10-CM

## 2013-04-06 DIAGNOSIS — E119 Type 2 diabetes mellitus without complications: Secondary | ICD-10-CM | POA: Insufficient documentation

## 2013-04-06 DIAGNOSIS — R42 Dizziness and giddiness: Secondary | ICD-10-CM

## 2013-04-06 DIAGNOSIS — I2510 Atherosclerotic heart disease of native coronary artery without angina pectoris: Secondary | ICD-10-CM | POA: Insufficient documentation

## 2013-04-06 DIAGNOSIS — E785 Hyperlipidemia, unspecified: Secondary | ICD-10-CM | POA: Insufficient documentation

## 2013-04-06 DIAGNOSIS — R55 Syncope and collapse: Secondary | ICD-10-CM | POA: Insufficient documentation

## 2013-04-26 ENCOUNTER — Ambulatory Visit: Payer: Managed Care, Other (non HMO) | Admitting: Cardiovascular Disease

## 2013-06-01 ENCOUNTER — Ambulatory Visit: Payer: Managed Care, Other (non HMO) | Admitting: Cardiovascular Disease

## 2013-06-08 ENCOUNTER — Other Ambulatory Visit: Payer: Self-pay | Admitting: Cardiovascular Disease

## 2013-06-24 ENCOUNTER — Ambulatory Visit (INDEPENDENT_AMBULATORY_CARE_PROVIDER_SITE_OTHER): Payer: Managed Care, Other (non HMO) | Admitting: Cardiovascular Disease

## 2013-06-24 VITALS — BP 135/79 | HR 70 | Wt 202.0 lb

## 2013-06-24 DIAGNOSIS — I2510 Atherosclerotic heart disease of native coronary artery without angina pectoris: Secondary | ICD-10-CM

## 2013-06-24 DIAGNOSIS — I10 Essential (primary) hypertension: Secondary | ICD-10-CM

## 2013-06-24 DIAGNOSIS — M6281 Muscle weakness (generalized): Secondary | ICD-10-CM

## 2013-06-24 DIAGNOSIS — R42 Dizziness and giddiness: Secondary | ICD-10-CM

## 2013-06-24 NOTE — Progress Notes (Signed)
History of Present Illness: 77 yo male with history of CAD, HTN, HLD here today for cardiac follow up. He has been followed in the past by Dr. Juanda Chance. I saw him for the first time 02/18/12. He underwent stenting of the right coronary artery in March 2009 with a 3.5 Liberte bare-metal stent. Lexiiscan stress myoview August 2013 showed no large areas of ischemia.  Echo with preserved LV function and no significant valvular issues. Since he had T wave inversions that were worrisome, cardiac cath was arranged and showed stable disease with patent stent RCA. I saw him 04/05/13 and he had c/o dizziness and muscle weakness. His statin was held. Carotid artery dopplers 04/05/13 with 0-39% bilateral artery stenosis. I also changed his verapamil to Norvasc given the bradycardia and dizziness.   He is here today for follow up. He has had no chest pain or SOB. He is feeling well. Occasional dizziness.   Primary Care Physician: Dr. Merlene Laughter  Last Lipid Profile: Followed in primary care   Past Medical History  Diagnosis Date  . CAD (coronary artery disease)     Bare meta stent RCA  . HTN (hypertension)   . Hyperlipidemia   . Diabetes mellitus   . Peripheral neuropathy     Past Surgical History  Procedure Laterality Date  . Tonsillectomy      Current Outpatient Prescriptions  Medication Sig Dispense Refill  . allopurinol (ZYLOPRIM) 300 MG tablet Take 300 mg by mouth daily.       Marland Kitchen amLODipine (NORVASC) 10 MG tablet Take 1 tablet (10 mg total) by mouth daily.  30 tablet  11  . aspirin 81 MG tablet Take 1 tablet (81 mg total) by mouth daily.  30 tablet  6  . BENICAR 40 MG tablet Take 20 mg by mouth daily.       . calcium-vitamin D (OSCAL WITH D) 500-200 MG-UNIT per tablet Take 1 tablet by mouth daily. Vitamin D 3  daily      . citalopram (CELEXA) 40 MG tablet Take 40 mg by mouth daily.       . Coenzyme Q10 (CO Q 10 PO) Take by mouth. 1 tab daily      . diazepam (VALIUM) 5 MG tablet Take 5 mg by  mouth daily as needed.       . diphenoxylate-atropine (LOMOTIL) 2.5-0.025 MG per tablet Take 1 tablet by mouth 4 (four) times daily as needed for diarrhea or loose stools.      Marland Kitchen GLUMETZA 500 MG 24 hr tablet Take 500 mg by mouth every evening.       . hydrochlorothiazide (HYDRODIURIL) 25 MG tablet TAKE 1 TABLET ONCE DAILY.  30 tablet  6  . LANTUS SOLOSTAR 100 UNIT/ML injection Inject 54 Units into the skin every morning.       Marland Kitchen levothyroxine (SYNTHROID, LEVOTHROID) 25 MCG tablet Take 25 mcg by mouth daily.      . nabumetone (RELAFEN) 500 MG tablet Take 500 mg by mouth 2 (two) times daily.       . nitroGLYCERIN (NITROSTAT) 0.4 MG SL tablet Place 1 tablet (0.4 mg total) under the tongue every 5 (five) minutes as needed for chest pain.  25 tablet  6  . omeprazole (PRILOSEC) 20 MG capsule Take 20 mg by mouth daily.      . potassium chloride SA (K-DUR,KLOR-CON) 20 MEQ tablet Take 20 mEq by mouth daily.       . rosuvastatin (CRESTOR) 10 MG tablet Take  10 mg by mouth daily.       . tamsulosin (FLOMAX) 0.4 MG CAPS capsule Take 0.4 mg by mouth as directed.      . zolpidem (AMBIEN) 10 MG tablet Take 5 mg by mouth at bedtime. For sleep       No current facility-administered medications for this visit.    Allergies  Allergen Reactions  . Heparin   . Penicillins   . Sulfonamide Derivatives     History   Social History  . Marital Status: Married    Spouse Name: N/A    Number of Children: N/A  . Years of Education: N/A   Occupational History  . Retired-CPA. Former Human resources officer    Social History Main Topics  . Smoking status: Former Smoker -- 0.50 packs/day for 10 years    Types: Cigarettes    Quit date: 02/05/1972  . Smokeless tobacco: Never Used  . Alcohol Use: 3.5 oz/week    7 drink(s) per week  . Drug Use: No  . Sexual Activity: Not on file   Other Topics Concern  . Not on file   Social History Narrative  . No narrative on file    Family History  Problem Relation Age  of Onset  . Heart attack Father 45  . Heart failure Mother     Review of Systems:  As stated in the HPI and otherwise negative.   BP 135/79  Pulse 70  Physical Examination: General: Well developed, well nourished, NAD HEENT: OP clear, mucus membranes moist SKIN: warm, dry. No rashes. Neuro: No focal deficits Musculoskeletal: Muscle strength 5/5 all ext Psychiatric: Mood and affect normal Neck: No JVD, no carotid bruits, no thyromegaly, no lymphadenopathy. Lungs:Clear bilaterally, no wheezes, rhonci, crackles Cardiovascular: Regular rate and rhythm. No murmurs, gallops or rubs. Abdomen:Soft. Bowel sounds present. Non-tender.  Extremities: No lower extremity edema. Pulses are 2 + in the bilateral DP/PT.  Cardiac cath; 03/04/12:  Left main: No disease noted.  Left Anterior Descending Artery: Large caliber vessel that courses to the apex. There is calcification in the proximal and mid vessel. The proximal vessel has a 50% eccentric stenosis. The mid vessel has a long, smooth 50% stenosis.  Ramus Intermediate: Moderate sized vessel with mild plaque disease.  Circumflex Artery: Large caliber vessel with mild plaque. The first OM branch is very small. The second OM branch is small with a 30% ostial stenosis.  Right Coronary Artery: Large, dominant vessel with 40% proximal stenosis. The mid vessel has a patent stent with minimal in-stent restenosis. The distal vessel has a 30% stenosis. The PDA and PLA are patent mild diffuse plaque disease.  Left Ventricular Angiogram: LVEF 55%.   EKG:   Carotid artery dopplers 04/06/13: 0-39% bilateral stenosis.   Assessment and Plan:   1. CAD: Stable. Cath August 2013 with stable moderate CAD. Continue medical therapy. He has been off of his statin due muscle aches.    2. HTN: BP well controlled. Continue current meds.    3. Dizziness: Improved off of verapamil. No carotid artery disesae.    4. Muscle weakness. Associated with statins. Improved  off of statin.

## 2013-06-24 NOTE — Patient Instructions (Signed)
Your physician wants you to follow-up in:  12 months.  You will receive a reminder letter in the mail two months in advance. If you don't receive a letter, please call our office to schedule the follow-up appointment.   

## 2013-11-23 ENCOUNTER — Ambulatory Visit (INDEPENDENT_AMBULATORY_CARE_PROVIDER_SITE_OTHER): Payer: Managed Care, Other (non HMO) | Admitting: Podiatry

## 2013-11-23 ENCOUNTER — Encounter: Payer: Self-pay | Admitting: Podiatry

## 2013-11-23 VITALS — BP 123/85 | HR 78 | Resp 16

## 2013-11-23 DIAGNOSIS — L84 Corns and callosities: Secondary | ICD-10-CM

## 2013-11-23 DIAGNOSIS — M779 Enthesopathy, unspecified: Secondary | ICD-10-CM

## 2013-11-23 MED ORDER — TRIAMCINOLONE ACETONIDE 10 MG/ML IJ SUSP
10.0000 mg | Freq: Once | INTRAMUSCULAR | Status: AC
  Administered 2013-11-23: 10 mg

## 2013-11-23 NOTE — Progress Notes (Signed)
Subjective:     Patient ID: [name], male   DOB: [birth year], 78 y.o.   MRN: [id number]  HPI patient presents with painful lesion third metatarsophalangeal joint left with a inability to walk comfortably on this area   Review of Systems  All other systems reviewed and are negative.      Objective:   Physical Exam  Nursing note and vitals reviewed. Constitutional: He is oriented to person, place, and time.  Cardiovascular: Intact distal pulses.   Musculoskeletal: Normal range of motion.  Neurological: He is oriented to person, place, and time.  Skin: Skin is warm.   neurovascular status intact with mild equinus condition high cavus foot type and good digital perfusion. Muscle strength was adequate and range of motion subtalar and midtarsal joint within normal limits. Patient is found to have a distal inflamed plantar keratotic lesion with inflammation of the plantar capsule     Assessment:     Capsulitis with porokeratosis type lesion    Plan:     H&P reviewed with patient and condition explained. From a dorsal direction I did a capsular injection around the area and around the lesion itself. After appropriate numbness I did debride the lesion and applied a Band-Aid area patient will be seen back when symptomatic

## 2013-11-23 NOTE — Progress Notes (Signed)
   Subjective:    Patient ID: [name], male    DOB: 1935-10-15, 78 y.o.   MRN: [id number]  HPI Comments: "I have like a little plantar's wart or something"  Patient c/o tender, callused area plantar forefoot left for several months. Sore when walking. Has tried OTC freezing meds-no help.  Also, the 2nd toenail right is thick and discolored. He states that it hits the end of the shoe and hard to clip.     Review of Systems  Musculoskeletal: Positive for myalgias.  All other systems reviewed and are negative.      Objective:   Physical Exam        Assessment & Plan:

## 2013-11-25 ENCOUNTER — Telehealth: Payer: Self-pay | Admitting: *Deleted

## 2013-11-25 NOTE — Telephone Encounter (Signed)
Dr. Paulla Dolly trimmed a callus for me last Wednesday.  It's hurting, is this normal?  I asked if he notices any drainage, redness or swelling.  He stated no, it looks normal.  I informed him it is okay.  I advised him to get a non-medicated callus pad and put around it to off-load it.  He stated okay.

## 2014-02-01 ENCOUNTER — Other Ambulatory Visit: Payer: Self-pay | Admitting: Cardiovascular Disease

## 2014-02-06 ENCOUNTER — Other Ambulatory Visit: Payer: Self-pay | Admitting: Cardiovascular Disease

## 2014-02-08 ENCOUNTER — Other Ambulatory Visit: Payer: Self-pay | Admitting: Cardiovascular Disease

## 2014-04-05 ENCOUNTER — Other Ambulatory Visit: Payer: Self-pay

## 2014-04-05 MED ORDER — HYDROCHLOROTHIAZIDE 25 MG PO TABS: ORAL_TABLET | ORAL | Status: DC

## 2014-04-10 ENCOUNTER — Other Ambulatory Visit: Payer: Self-pay | Admitting: Cardiovascular Disease

## 2014-06-06 ENCOUNTER — Other Ambulatory Visit: Payer: Self-pay | Admitting: Cardiovascular Disease

## 2014-06-09 ENCOUNTER — Ambulatory Visit (INDEPENDENT_AMBULATORY_CARE_PROVIDER_SITE_OTHER): Payer: Managed Care, Other (non HMO) | Admitting: Cardiovascular Disease

## 2014-06-09 ENCOUNTER — Encounter: Payer: Self-pay | Admitting: Cardiovascular Disease

## 2014-06-09 VITALS — BP 130/72 | HR 62 | Ht 74.0 in | Wt 205.0 lb

## 2014-06-09 DIAGNOSIS — I2510 Atherosclerotic heart disease of native coronary artery without angina pectoris: Secondary | ICD-10-CM

## 2014-06-09 DIAGNOSIS — I10 Essential (primary) hypertension: Secondary | ICD-10-CM

## 2014-06-09 NOTE — Patient Instructions (Signed)
Your physician wants you to follow-up in:  12 months.  You will receive a reminder letter in the mail two months in advance. If you don't receive a letter, please call our office to schedule the follow-up appointment.   

## 2014-06-09 NOTE — Progress Notes (Signed)
History of Present Illness: 78 yo male with history of CAD, HTN, HLD here today for cardiac follow up. He has been followed in the past by Dr. Olevia Perches. I saw him for the first time 02/18/12. He underwent stenting of the right coronary artery in March 2009 with a 3.5 Liberte bare-metal stent. Lexiiscan stress myoview August 2013 showed no large areas of ischemia.  Echo with preserved LV function and no significant valvular issues. Since he had T wave inversions that were worrisome, cardiac cath was arranged and showed stable disease with patent stent RCA. I saw him 04/05/13 and he had c/o dizziness and muscle weakness. His statin was held. Carotid artery dopplers 04/05/13 with 0-39% bilateral artery stenosis. I also changed his verapamil to Norvasc given the bradycardia and dizziness.   He is here today for follow up. He has had no chest pain or SOB. He is feeling well. Occasional dizziness when bending over.   Primary Care Physician: Dr. Lajean Manes  Last Lipid Profile: Followed in primary care   Past Medical History  Diagnosis Date  . CAD (coronary artery disease)     Bare meta stent RCA  . HTN (hypertension)   . Hyperlipidemia   . Diabetes mellitus   . Peripheral neuropathy     Past Surgical History  Procedure Laterality Date  . Tonsillectomy      Current Outpatient Prescriptions  Medication Sig Dispense Refill  . allopurinol (ZYLOPRIM) 300 MG tablet Take 300 mg by mouth daily.     Marland Kitchen amLODipine (NORVASC) 10 MG tablet TAKE 1 TABLET ONCE DAILY. 30 tablet 0  . aspirin 81 MG tablet Take 1 tablet (81 mg total) by mouth daily. 30 tablet 6  . BENICAR 20 MG tablet Take 20 mg by mouth daily.     Marland Kitchen BENICAR 40 MG tablet Take 20 mg by mouth daily.     . calcium-vitamin D (OSCAL WITH D) 500-200 MG-UNIT per tablet Take 1 tablet by mouth daily. Vitamin D 3  daily    . citalopram (CELEXA) 40 MG tablet Take 40 mg by mouth daily.     . Coenzyme Q10 (CO Q 10 PO) Take by mouth. 1 tab daily    .  cyanocobalamin (,VITAMIN B-12,) 1000 MCG/ML injection Inject 1,000 mcg into the muscle every 30 (thirty) days.     . diazepam (VALIUM) 5 MG tablet Take 5 mg by mouth daily as needed.     . diphenoxylate-atropine (LOMOTIL) 2.5-0.025 MG per tablet Take 1 tablet by mouth 4 (four) times daily as needed for diarrhea or loose stools.    . hydrochlorothiazide (HYDRODIURIL) 25 MG tablet TAKE 1 TABLET ONCE DAILY. 30 tablet 6  . LANTUS SOLOSTAR 100 UNIT/ML injection Inject 54 Units into the skin every morning.     Marland Kitchen levothyroxine (SYNTHROID, LEVOTHROID) 25 MCG tablet Take 25 mcg by mouth daily.    . nabumetone (RELAFEN) 500 MG tablet Take 500 mg by mouth 2 (two) times daily as needed.     Marland Kitchen NITROSTAT 0.4 MG SL tablet PLACE 1 TABLET UNDER THE TONGUE EVERY 5 MINUTES AS NEEDED FOR CHEST PAIN. 25 tablet 3  . omeprazole (PRILOSEC) 20 MG capsule Take 20 mg by mouth daily as needed.     . potassium chloride SA (K-DUR,KLOR-CON) 20 MEQ tablet Take 20 mEq by mouth daily.     . TRADJENTA 5 MG TABS tablet Take 5 mg by mouth daily.     Marland Kitchen zolpidem (AMBIEN) 10 MG tablet Take  5 mg by mouth at bedtime. For sleep     No current facility-administered medications for this visit.    Allergies  Allergen Reactions  . Heparin   . Penicillins   . Sulfonamide Derivatives     History   Social History  . Marital Status: Married    Spouse Name: N/A    Number of Children: N/A  . Years of Education: N/A   Occupational History  . Retired-CPA. Former Tourist information centre manager    Social History Main Topics  . Smoking status: Former Smoker -- 0.50 packs/day for 10 years    Types: Cigarettes    Quit date: 02/05/1972  . Smokeless tobacco: Never Used  . Alcohol Use: 3.5 oz/week    7 drink(s) per week  . Drug Use: No  . Sexual Activity: Not on file   Other Topics Concern  . Not on file   Social History Narrative    Family History  Problem Relation Age of Onset  . Heart attack Father 60  . Heart failure Mother   .  Stroke Mother     Review of Systems:  As stated in the HPI and otherwise negative.   BP 130/72 mmHg  Pulse 62  Ht 6\' 2"  (1.88 m)  Wt 205 lb (92.987 kg)  BMI 26.31 kg/m2  Physical Examination: General: Well developed, well nourished, NAD HEENT: OP clear, mucus membranes moist SKIN: warm, dry. No rashes. Neuro: No focal deficits Musculoskeletal: Muscle strength 5/5 all ext Psychiatric: Mood and affect normal Neck: No JVD, no carotid bruits, no thyromegaly, no lymphadenopathy. Lungs:Clear bilaterally, no wheezes, rhonci, crackles Cardiovascular: Regular rate and rhythm. No murmurs, gallops or rubs. Abdomen:Soft. Bowel sounds present. Non-tender.  Extremities: No lower extremity edema. Pulses are 2 + in the bilateral DP/PT.  Cardiac cath; 03/04/12:  Left main: No disease noted.  Left Anterior Descending Artery: Large caliber vessel that courses to the apex. There is calcification in the proximal and mid vessel. The proximal vessel has a 50% eccentric stenosis. The mid vessel has a long, smooth 50% stenosis.  Ramus Intermediate: Moderate sized vessel with mild plaque disease.  Circumflex Artery: Large caliber vessel with mild plaque. The first OM branch is very small. The second OM branch is small with a 30% ostial stenosis.  Right Coronary Artery: Large, dominant vessel with 40% proximal stenosis. The mid vessel has a patent stent with minimal in-stent restenosis. The distal vessel has a 30% stenosis. The PDA and PLA are patent mild diffuse plaque disease.  Left Ventricular Angiogram: LVEF 55%.   EKG:   Carotid artery dopplers 04/06/13: 0-39% bilateral stenosis.   Assessment and Plan:   1. CAD: Stable. Cath August 2013 with stable moderate CAD. Continue medical therapy. He is tolerating low dose statin.     2. HTN: BP well controlled. Continue current meds.

## 2014-06-15 ENCOUNTER — Encounter (HOSPITAL_COMMUNITY): Payer: Self-pay | Admitting: Cardiovascular Disease

## 2014-07-04 ENCOUNTER — Other Ambulatory Visit: Payer: Self-pay | Admitting: Cardiovascular Disease

## 2014-10-31 ENCOUNTER — Other Ambulatory Visit: Payer: Self-pay | Admitting: Cardiovascular Disease

## 2015-01-30 ENCOUNTER — Other Ambulatory Visit: Payer: Self-pay | Admitting: Cardiovascular Disease

## 2015-01-31 ENCOUNTER — Other Ambulatory Visit: Payer: Self-pay

## 2015-01-31 MED ORDER — AMLODIPINE BESYLATE 10 MG PO TABS: 10.0000 mg | ORAL_TABLET | Freq: Every day | ORAL | Status: DC

## 2015-04-22 ENCOUNTER — Other Ambulatory Visit: Payer: Self-pay | Admitting: Cardiovascular Disease

## 2015-05-24 ENCOUNTER — Other Ambulatory Visit: Payer: Self-pay | Admitting: Cardiovascular Disease

## 2015-06-22 ENCOUNTER — Telehealth: Payer: Self-pay | Admitting: Cardiovascular Disease

## 2015-06-22 NOTE — Telephone Encounter (Signed)
Spoke with pt. He is complaining of left shoulder and arm pain. Comes and goes. Started about a week ago. Some relief with tylenol.  Moved recently and carried a lot of boxes. Also complaining of shortness of breath with physical activity. Reports "feeling lousy" and having no energy. This is new for him and has been going on for about a week.  Has appt in January and is requesting earlier appt.  He is able to come in today.  Will review with provider in office.

## 2015-06-22 NOTE — Telephone Encounter (Signed)
New message  Pt c/o Pain: 1. Are you having pain right now? Yes, In his left and left shoulder  2. Are you experiencing any other symptoms (ex. SOB, nausea, vomiting, sweating)? nO  3. How long have you been experiencing pain? For several days  4. Is your pain continuous or coming and going? Continuous  Comments: No other symptoms just weak.

## 2015-06-22 NOTE — Telephone Encounter (Signed)
Reviewed with Ellen Henri, PA.  No availability in office today.  Try to schedule pt for appt next week.  I spoke with pt and appt made for him to see Richardson Dopp, PA on 06/25/15 at 11:50.  Pt made aware to go to ED if symptoms worsen prior to this appt.

## 2015-06-24 NOTE — Progress Notes (Signed)
Cardiology Office Note    Date:  06/25/2015   ID:  [name], DOB Aug 20, 1935, MRN [id number]  PCP:  Mathews Argyle, MD  Cardiologist:  Dr. Lauree Chandler   Electrophysiologist:  n/a  Chief Complaint  Patient presents with  . Arm Pain    History of Present Illness:  [name] is a 79 y.o. male with a history of CAD, HTN, HL. Previous he followed by Dr. Olevia Perches. He now sees Dr. Angelena Form. Patient underwent PCI with a BMS to the RCA in 3/09. Myoview NA/13 demonstrated no ischemia. Cardiac catheterization in 8/13 demonstrated patent stent in the RCA and otherwise stable disease. Last seen by Dr. Angelena Form 12/15.  He called in recently with complaints of left arm pain. He was placed on my schedule for further evaluation. The patient recently moved from his house to [place] He's been under a great deal of stress. His left shoulder and arm has been hurting for the past 2 weeks. The pain comes and goes. He can sometimes reproduce his pain with positional changes. He notes fatigue. He says that he just does not feel like himself. He denies chest discomfort. He has noted some dyspnea with activity over the past 2 weeks. He feels exhausted. He denies orthopnea, PND or edema. Denies syncope.   Past Medical History  Diagnosis Date  . CAD (coronary artery disease)     Bare meta stent RCA  . HTN (hypertension)   . Hyperlipidemia   . Diabetes mellitus ([place])   . Peripheral neuropathy [facility])     Past Surgical History  Procedure Laterality Date  . Tonsillectomy    . Left heart catheterization with coronary angiogram N/A 03/04/2012    Procedure: LEFT HEART CATHETERIZATION WITH CORONARY ANGIOGRAM;  Surgeon: Burnell Blanks, MD;  Location: [facility] CATH LAB;  Service: Cardiovascular;  Laterality: N/A;    Current Outpatient Prescriptions  Medication Sig Dispense Refill  . allopurinol (ZYLOPRIM) 300 MG tablet Take 300 mg by mouth daily.     Marland Kitchen amLODipine (NORVASC) 10 MG  tablet Take 1 tablet (10 mg total) by mouth daily. 30 tablet 6  . aspirin 81 MG tablet Take 1 tablet (81 mg total) by mouth daily. 30 tablet 6  . BENICAR 20 MG tablet Take 20 mg by mouth daily.     . calcium-vitamin D (OSCAL WITH D) 500-200 MG-UNIT per tablet Take 1 tablet by mouth daily. Vitamin D 3  daily    . citalopram (CELEXA) 40 MG tablet Take 40 mg by mouth daily.     . Coenzyme Q10 (CO Q 10 PO) Take by mouth. 1 tab daily    . CRESTOR 20 MG tablet Take 20 mg by mouth daily.     . cyanocobalamin (,VITAMIN B-12,) 1000 MCG/ML injection Inject 1,000 mcg into the muscle every 30 (thirty) days.     . diazepam (VALIUM) 5 MG tablet Take 5 mg by mouth daily as needed for anxiety.     . diphenoxylate-atropine (LOMOTIL) 2.5-0.025 MG per tablet Take 1 tablet by mouth 4 (four) times daily as needed for diarrhea or loose stools.    . hydrochlorothiazide (HYDRODIURIL) 25 MG tablet Take 1 tablet (25 mg total) by mouth daily. 30 tablet 5  . LANTUS SOLOSTAR 100 UNIT/ML injection Inject 54 Units into the skin every morning.     Marland Kitchen levothyroxine (SYNTHROID, LEVOTHROID) 25 MCG tablet Take 25 mcg by mouth daily.    . nabumetone (RELAFEN) 500 MG tablet Take 500 mg by  mouth 2 (two) times daily as needed for mild pain.     Marland Kitchen NITROSTAT 0.4 MG SL tablet PLACE 1 TABLET UNDER THE TONGUE EVERY 5 MINUTES AS NEEDED FOR CHEST PAIN. 25 tablet 1  . omeprazole (PRILOSEC) 20 MG capsule Take 20 mg by mouth daily as needed.     . potassium chloride SA (K-DUR,KLOR-CON) 20 MEQ tablet Take 20 mEq by mouth daily.     . TRADJENTA 5 MG TABS tablet Take 5 mg by mouth daily.     Marland Kitchen zolpidem (AMBIEN) 10 MG tablet Take 5 mg by mouth at bedtime. For sleep     No current facility-administered medications for this visit.    Allergies:   Heparin; Penicillins; and Sulfonamide derivatives   Social History   Social History  . Marital Status: Married    Spouse Name: N/A  . Number of Children: N/A  . Years of Education: N/A    Occupational History  . Retired-CPA. Former Tourist information centre manager    Social History Main Topics  . Smoking status: Former Smoker -- 0.50 packs/day for 10 years    Types: Cigarettes    Quit date: 02/05/1972  . Smokeless tobacco: Never Used  . Alcohol Use: 3.5 oz/week    7 drink(s) per week  . Drug Use: No  . Sexual Activity: Not Asked   Other Topics Concern  . None   Social History Narrative     Family History:  The patient's family history includes Heart attack (age of onset: 55) in his father; Heart failure in his mother; Stroke in his mother.   ROS:   Please see the history of present illness.    Review of Systems  Constitution: Negative for decreased appetite and fever.  Respiratory: Positive for cough.   Hematologic/Lymphatic: Negative for bleeding problem.  Gastrointestinal: Negative for diarrhea, hematochezia, melena and vomiting.  All other systems reviewed and are negative.   PHYSICAL EXAM:   VS:  BP 130/68 mmHg  Pulse 60  Ht 6\' 2"  (1.88 m)  Wt 202 lb (91.627 kg)  BMI 25.92 kg/m2   GEN: Well nourished, well developed, in no acute distress HEENT: normal Neck: no JVD, no carotid bruits, no masses Cardiac: RRR; no murmurs, rubs, or gallops,no edema  Respiratory:  clear to auscultation bilaterally, normal work of breathing GI: soft, nontender, nondistended MS: No crepitus noted with passive range of motion of the left shoulder, there is some discomfort in his posterior shoulder with internal rotation Skin: warm and dry, no rash Neuro:  Alert and Oriented x 3, Strength and sensation are intact Psych: euthymic mood   Wt Readings from Last 3 Encounters:  06/25/15 202 lb (91.627 kg)  06/09/14 205 lb (92.987 kg)  06/24/13 202 lb (91.627 kg)      Studies/Labs Reviewed:   EKG:  EKG is ordered today.  The ekg ordered today demonstrates NSR, HR 60, LAD, septal Q waves, IVCD, QTc 428 ms  Recent Labs: No results found for requested labs within last 365 days.    Lipid Panel No recent lipids on file  Additional studies/ records that were reviewed today include:  Carotid US 10/14 Bilat 1-39% >> FU 1 year  Myoview 8/13 Normal, EF 64%  Echo 8/13 Moderate LVH, EF 60-65%, normal wall motion, grade 1 diastolic dysfunction, trivial AI, mild LAE, PASP 40 mmHg  LHC 8/13 LM ok LAD: prox 50%, mid 50% LCx ostial OM2 30% RCA prox 40%, mid stent patent, dist 30% EF 55%  ASSESSMENT:    1. Left arm pain   2. Other fatigue   3. Coronary artery disease involving native coronary artery of native heart without angina pectoris   4. Essential hypertension   5. Hyperlipidemia   6. Carotid stenosis, bilateral      PLAN:  In order of problems listed above:  1. L arm pain is likely related to a muscular strain related to his recent move. However, he does not feel good and notes fatigue.  His PCI was prompted by an abnormal stress test in 2009.  He notes that his diabetes has been out of control.  Last cath in 2013 with patent RCA stent and non-obstructive disease elsewhere.  I will arrange an ETT-Myoview to rule out ischemia as a cause for his symptoms. If low risk, he can FU with PCP for further evaluation and management. 2. Fatigue is likely related to stress of moving.  He had recent labs with his PCP. Will request most recent CBC, TSH, BMET.  Myoview pending as noted.  3. CAD:   As noted, arrange Myoview.  Continue ASA, statin.    4. Hypertension is well controlled.  Continue current Rx.  5. Hyperlipidemia is managed by PCP.  He remains on Crestor.  6. Carotid stenosis with bilateral 1-39% ICA stenosis in 2014. Follow-up Doppler recommended 1 year. Arrange follow-up carotid US.   Medication Adjustments/Labs and Tests Ordered: Current medicines are reviewed at length with the patient today.  Concerns regarding medicines are outlined above.  Medication changes, Labs and Tests ordered today are listed below. Patient Instructions  Medication  Instructions:  Your physician recommends that you continue on your current medications as directed. Please refer to the Current Medication list given to you today.   Labwork: NONE  Testing/Procedures: 1. Your physician has requested that you have a carotid duplex. This test is an ultrasound of the carotid arteries in your neck. It looks at blood flow through these arteries that supply the brain with blood. Allow one hour for this exam. There are no restrictions or special instructions.  2. Your physician has requested that you have en exercise stress myoview. For further information please visit [email or web address]. Please follow instruction sheet, as given.   Follow-Up: KEEP YOUR APPT WITH DR. Angelena Form IN 07/2015  Any Other Special Instructions Will Be Listed Below (If Applicable).   If you need a refill on your cardiac medications before your next appointment, please call your pharmacy.    Signed, Richardson Dopp, PA-C  06/25/2015 1:07 PM    [facility] [address], [place], [place]  [address] Phone: [phone]; Fax: [phone]

## 2015-06-25 ENCOUNTER — Ambulatory Visit (INDEPENDENT_AMBULATORY_CARE_PROVIDER_SITE_OTHER): Payer: Managed Care, Other (non HMO) | Admitting: Physician Assistant

## 2015-06-25 ENCOUNTER — Encounter: Payer: Self-pay | Admitting: Physician Assistant

## 2015-06-25 VITALS — BP 130/68 | HR 60 | Ht 74.0 in | Wt 202.0 lb

## 2015-06-25 DIAGNOSIS — R5383 Other fatigue: Secondary | ICD-10-CM | POA: Diagnosis not present

## 2015-06-25 DIAGNOSIS — I2510 Atherosclerotic heart disease of native coronary artery without angina pectoris: Secondary | ICD-10-CM

## 2015-06-25 DIAGNOSIS — M79602 Pain in left arm: Secondary | ICD-10-CM | POA: Diagnosis not present

## 2015-06-25 DIAGNOSIS — E785 Hyperlipidemia, unspecified: Secondary | ICD-10-CM

## 2015-06-25 DIAGNOSIS — I6523 Occlusion and stenosis of bilateral carotid arteries: Secondary | ICD-10-CM

## 2015-06-25 DIAGNOSIS — I10 Essential (primary) hypertension: Secondary | ICD-10-CM | POA: Diagnosis not present

## 2015-06-25 NOTE — Patient Instructions (Signed)
Medication Instructions:  Your physician recommends that you continue on your current medications as directed. Please refer to the Current Medication list given to you today.   Labwork: NONE  Testing/Procedures: 1. Your physician has requested that you have a carotid duplex. This test is an ultrasound of the carotid arteries in your neck. It looks at blood flow through these arteries that supply the brain with blood. Allow one hour for this exam. There are no restrictions or special instructions.  2. Your physician has requested that you have en exercise stress myoview. For further information please visit [email or web address]. Please follow instruction sheet, as given.   Follow-Up: KEEP YOUR APPT WITH DR. Angelena Form IN 07/2015  Any Other Special Instructions Will Be Listed Below (If Applicable).   If you need a refill on your cardiac medications before your next appointment, please call your pharmacy.

## 2015-06-27 ENCOUNTER — Telehealth (HOSPITAL_COMMUNITY): Payer: Self-pay | Admitting: *Deleted

## 2015-06-27 NOTE — Telephone Encounter (Signed)
Patient given detailed instructions per Myocardial Perfusion Study Information Sheet for the test on 07/03/15 at 745. Patient notified to arrive 15 minutes early and that it is imperative to arrive on time for appointment to keep from having the test rescheduled.  If you need to cancel or reschedule your appointment, please call the office within 24 hours of your appointment. Failure to do so may result in a cancellation of your appointment, and a $50 no show fee. Patient verbalized understanding.Hubbard Robinson, RN

## 2015-06-28 ENCOUNTER — Inpatient Hospital Stay (HOSPITAL_COMMUNITY): Admission: RE | Admit: 2015-06-28 | Payer: Managed Care, Other (non HMO) | Source: Ambulatory Visit

## 2015-07-03 ENCOUNTER — Ambulatory Visit (HOSPITAL_COMMUNITY): Payer: Managed Care, Other (non HMO) | Attending: Cardiovascular Disease

## 2015-07-03 DIAGNOSIS — R5383 Other fatigue: Secondary | ICD-10-CM | POA: Insufficient documentation

## 2015-07-03 DIAGNOSIS — I2510 Atherosclerotic heart disease of native coronary artery without angina pectoris: Secondary | ICD-10-CM | POA: Diagnosis not present

## 2015-07-03 DIAGNOSIS — E119 Type 2 diabetes mellitus without complications: Secondary | ICD-10-CM | POA: Insufficient documentation

## 2015-07-03 DIAGNOSIS — R9439 Abnormal result of other cardiovascular function study: Secondary | ICD-10-CM | POA: Diagnosis not present

## 2015-07-03 DIAGNOSIS — I10 Essential (primary) hypertension: Secondary | ICD-10-CM | POA: Insufficient documentation

## 2015-07-03 DIAGNOSIS — M79602 Pain in left arm: Secondary | ICD-10-CM | POA: Insufficient documentation

## 2015-07-03 LAB — MYOCARDIAL PERFUSION IMAGING
CHL CUP NUCLEAR SDS: 3
CSEPPHR: 60 {beats}/min
LHR: 0.27
LV sys vol: 34 mL
LVDIAVOL: 102 mL
Rest HR: 54 {beats}/min
SRS: 4
SSS: 7
TID: 1.04

## 2015-07-03 MED ORDER — TECHNETIUM TC 99M SESTAMIBI GENERIC - CARDIOLITE
31.5000 | Freq: Once | INTRAVENOUS | Status: AC | PRN
  Administered 2015-07-03: 32 via INTRAVENOUS

## 2015-07-03 MED ORDER — TECHNETIUM TC 99M SESTAMIBI GENERIC - CARDIOLITE
10.1000 | Freq: Once | INTRAVENOUS | Status: AC | PRN
  Administered 2015-07-03: 10.1 via INTRAVENOUS

## 2015-07-03 MED ORDER — REGADENOSON 0.4 MG/5ML IV SOLN
0.4000 mg | Freq: Once | INTRAVENOUS | Status: AC
  Administered 2015-07-03: 0.4 mg via INTRAVENOUS

## 2015-07-03 NOTE — Telephone Encounter (Signed)
Thanks

## 2015-07-04 ENCOUNTER — Encounter: Payer: Self-pay | Admitting: Physician Assistant

## 2015-07-04 ENCOUNTER — Other Ambulatory Visit: Payer: Self-pay | Admitting: *Deleted

## 2015-07-04 MED ORDER — ISOSORBIDE MONONITRATE ER 30 MG PO TB24: 30.0000 mg | ORAL_TABLET | Freq: Every day | ORAL | Status: DC

## 2015-07-05 ENCOUNTER — Other Ambulatory Visit: Payer: Self-pay | Admitting: *Deleted

## 2015-07-05 MED ORDER — ISOSORBIDE MONONITRATE ER 30 MG PO TB24: 30.0000 mg | ORAL_TABLET | Freq: Every day | ORAL | Status: DC

## 2015-07-19 ENCOUNTER — Encounter: Payer: Self-pay | Admitting: Cardiovascular Disease

## 2015-07-19 ENCOUNTER — Ambulatory Visit (INDEPENDENT_AMBULATORY_CARE_PROVIDER_SITE_OTHER): Payer: Managed Care, Other (non HMO) | Admitting: Cardiovascular Disease

## 2015-07-19 VITALS — BP 118/58 | HR 65 | Ht 74.0 in | Wt 199.4 lb

## 2015-07-19 DIAGNOSIS — I10 Essential (primary) hypertension: Secondary | ICD-10-CM

## 2015-07-19 DIAGNOSIS — I2510 Atherosclerotic heart disease of native coronary artery without angina pectoris: Secondary | ICD-10-CM | POA: Diagnosis not present

## 2015-07-19 DIAGNOSIS — I6523 Occlusion and stenosis of bilateral carotid arteries: Secondary | ICD-10-CM

## 2015-07-19 NOTE — Patient Instructions (Signed)
Medication Instructions:  Your physician recommends that you continue on your current medications as directed. Please refer to the Current Medication list given to you today.   Labwork: none  Testing/Procedures:  Your physician has requested that you have a carotid duplex. This test is an ultrasound of the carotid arteries in your neck. It looks at blood flow through these arteries that supply the brain with blood. Allow one hour for this exam. There are no restrictions or special instructions.  To be done later this Spring.     Follow-Up: Your physician wants you to follow-up in: 12 months.  You will receive a reminder letter in the mail two months in advance. If you don't receive a letter, please call our office to schedule the follow-up appointment.   Any Other Special Instructions Will Be Listed Below (If Applicable).     If you need a refill on your cardiac medications before your next appointment, please call your pharmacy.

## 2015-07-19 NOTE — Progress Notes (Signed)
Chief Complaint  Patient presents with  . Follow-up    pt has no complaints  . Coronary Artery Disease      History of Present Illness: 80 yo male with history of CAD, HTN, HLD here today for cardiac follow up. He has been followed in the past by Dr. Olevia Perches. I saw him for the first time 02/18/12. He underwent stenting of the right coronary artery in March 2009 with a 3.5 Liberte bare-metal stent. Lexiiscan stress myoview August 2013 showed no large areas of ischemia.  Echo with preserved LV function and no significant valvular issues. Since he had T wave inversions that were worrisome, cardiac cath was arranged August 2013 and showed stable disease with patent stent RCA. I saw him 04/05/13 and he had c/o dizziness and muscle weakness. His statin was held. Carotid artery dopplers 04/05/13 with 0-39% bilateral artery stenosis. I also changed his verapamil to Norvasc given the bradycardia and dizziness. He was seen in our office December 2016 by Richardson Dopp, PA-C for left arm pain and numbness. No chest pain. Stress myoview 07/03/15 low risk with  No large areas of ischemia. He tried Imdur but it made him itch.   He is here today for follow up. He has had no chest pain or SOB. Arm pain resolved. He is feeling well. He thinks he was just having trouble due to the stress of his move.    Primary Care Physician: Dr. Lajean Manes  Last Lipid Profile: Followed in primary care   Past Medical History  Diagnosis Date  . CAD (coronary artery disease)     Bare meta stent RCA  . HTN (hypertension)   . Hyperlipidemia   . Diabetes mellitus ([place])   . Peripheral neuropathy ([place])   . History of nuclear stress test     Myoview 12/16: EF 66%, very mild inferior ischemia; Low Risk    Past Surgical History  Procedure Laterality Date  . Tonsillectomy    . Left heart catheterization with coronary angiogram N/A 03/04/2012    Procedure: LEFT HEART CATHETERIZATION WITH CORONARY ANGIOGRAM;  Surgeon: Burnell Blanks, MD;  Location: [facility] CATH LAB;  Service: Cardiovascular;  Laterality: N/A;    Current Outpatient Prescriptions  Medication Sig Dispense Refill  . allopurinol (ZYLOPRIM) 300 MG tablet Take 300 mg by mouth daily.     Marland Kitchen amLODipine (NORVASC) 10 MG tablet Take 1 tablet (10 mg total) by mouth daily. 30 tablet 6  . aspirin 81 MG tablet Take 1 tablet (81 mg total) by mouth daily. 30 tablet 6  . BENICAR 20 MG tablet Take 20 mg by mouth daily.     . calcium-vitamin D (OSCAL WITH D) 500-200 MG-UNIT per tablet Take 1 tablet by mouth daily. Vitamin D 3  daily    . citalopram (CELEXA) 40 MG tablet Take 40 mg by mouth daily.     . Coenzyme Q10 (CO Q 10 PO) Take by mouth. 1 tab daily    . CRESTOR 20 MG tablet Take 10 mg by mouth daily.     . cyanocobalamin (,VITAMIN B-12,) 1000 MCG/ML injection Inject 1,000 mcg into the muscle every 30 (thirty) days.     . diazepam (VALIUM) 5 MG tablet Take 5 mg by mouth daily as needed for anxiety.     . diphenoxylate-atropine (LOMOTIL) 2.5-0.025 MG per tablet Take 1 tablet by mouth 4 (four) times daily as needed for diarrhea or loose stools.    . hydrochlorothiazide (HYDRODIURIL) 25 MG tablet  Take 1 tablet (25 mg total) by mouth daily. 30 tablet 5  . LANTUS SOLOSTAR 100 UNIT/ML injection Inject 54 Units into the skin every morning.     Marland Kitchen levothyroxine (SYNTHROID, LEVOTHROID) 25 MCG tablet Take 25 mcg by mouth daily.    . nabumetone (RELAFEN) 500 MG tablet Take 500 mg by mouth 2 (two) times daily as needed for mild pain.     Marland Kitchen NITROSTAT 0.4 MG SL tablet PLACE 1 TABLET UNDER THE TONGUE EVERY 5 MINUTES AS NEEDED FOR CHEST PAIN. 25 tablet 1  . omeprazole (PRILOSEC) 20 MG capsule Take 20 mg by mouth daily as needed.     . potassium chloride SA (K-DUR,KLOR-CON) 20 MEQ tablet Take 20 mEq by mouth 2 (two) times daily.     . sitaGLIPtin (JANUVIA) 50 MG tablet Take 50 mg by mouth daily.    Marland Kitchen zolpidem (AMBIEN) 10 MG tablet Take 5 mg by mouth at bedtime. For sleep     No  current facility-administered medications for this visit.    Allergies  Allergen Reactions  . Heparin Hives  . Isosorbide Mononitrate [Isosorbide Dinitrate Er] Hives  . Penicillins Hives  . Sulfonamide Derivatives Hives    Social History   Social History  . Marital Status: Married    Spouse Name: N/A  . Number of Children: N/A  . Years of Education: N/A   Occupational History  . Retired-CPA. Former Tourist information centre manager    Social History Main Topics  . Smoking status: Former Smoker -- 0.50 packs/day for 10 years    Types: Cigarettes    Quit date: 02/05/1972  . Smokeless tobacco: Never Used  . Alcohol Use: 3.5 oz/week    7 drink(s) per week  . Drug Use: No  . Sexual Activity: Not on file   Other Topics Concern  . Not on file   Social History Narrative    Family History  Problem Relation Age of Onset  . Heart attack Father 28  . Heart failure Mother   . Stroke Mother     Review of Systems:  As stated in the HPI and otherwise negative.   BP 118/58 mmHg  Pulse 65  Ht 6\' 2"  (1.88 m)  Wt 199 lb 6.4 oz (90.447 kg)  BMI 25.59 kg/m2  SpO2 92%  Physical Examination: General: Well developed, well nourished, NAD HEENT: OP clear, mucus membranes moist SKIN: warm, dry. No rashes. Neuro: No focal deficits Musculoskeletal: Muscle strength 5/5 all ext Psychiatric: Mood and affect normal Neck: No JVD, no carotid bruits, no thyromegaly, no lymphadenopathy. Lungs:Clear bilaterally, no wheezes, rhonci, crackles Cardiovascular: Regular rate and rhythm. No murmurs, gallops or rubs. Abdomen:Soft. Bowel sounds present. Non-tender.  Extremities: No lower extremity edema. Pulses are 2 + in the bilateral DP/PT.  Cardiac cath; 03/04/12:  Left main: No disease noted.  Left Anterior Descending Artery: Large caliber vessel that courses to the apex. There is calcification in the proximal and mid vessel. The proximal vessel has a 50% eccentric stenosis. The mid vessel has a long,  smooth 50% stenosis.  [place] Intermediate: Moderate sized vessel with mild plaque disease.  Circumflex Artery: Large caliber vessel with mild plaque. The first OM branch is very small. The second OM branch is small with a 30% ostial stenosis.  Right Coronary Artery: Large, dominant vessel with 40% proximal stenosis. The mid vessel has a patent stent with minimal in-stent restenosis. The distal vessel has a 30% stenosis. The PDA and PLA are patent mild diffuse plaque  disease.  Left Ventricular Angiogram: LVEF 55%.   Carotid artery dopplers 04/06/13: 0-39% bilateral stenosis.   EKG:  EKG is not ordered today. The ekg ordered today demonstrates   Recent Labs: No results found for requested labs within last 365 days.   Lipid Panel   Wt Readings from Last 3 Encounters:  07/19/15 199 lb 6.4 oz (90.447 kg)  07/03/15 202 lb (91.627 kg)  06/25/15 202 lb (91.627 kg)     Other studies Reviewed: Additional studies/ records that were reviewed today include: . Review of the above records demonstrates:    Assessment and Plan:   1. CAD: Stable. Cath August 2013 with stable moderate CAD. Stress myoview December 2016 was low risk. No chest pain . Continue medical therapy. He is tolerating low dose statin.     2. HTN: BP well controlled. Continue current meds.    3. Carotid artery disease: Mild bilateral carotid disease by dopplers October 2014. Will repeat dopplers this spring  Current medicines are reviewed at length with the patient today.  The patient does not have concerns regarding medicines.  The following changes have been made:  no change  Labs/ tests ordered today include:  No orders of the defined types were placed in this encounter.    Disposition:   FU with me in 12  months  Signed, Lauree Chandler, MD 07/19/2015 4:55 PM    [facility] [address], [place], [place]  [address] Phone: [phone]; Fax: [phone]

## 2015-09-04 ENCOUNTER — Other Ambulatory Visit: Payer: Self-pay | Admitting: Cardiovascular Disease

## 2015-10-01 ENCOUNTER — Ambulatory Visit (HOSPITAL_COMMUNITY)
Admission: RE | Admit: 2015-10-01 | Discharge: 2015-10-01 | Disposition: A | Discharge status: Home / Self Care | Payer: Managed Care, Other (non HMO) | Source: Ambulatory Visit | Attending: Physician Assistant | Admitting: Physician Assistant

## 2015-10-01 DIAGNOSIS — E1142 Type 2 diabetes mellitus with diabetic polyneuropathy: Secondary | ICD-10-CM | POA: Diagnosis not present

## 2015-10-01 DIAGNOSIS — I10 Essential (primary) hypertension: Secondary | ICD-10-CM | POA: Diagnosis not present

## 2015-10-01 DIAGNOSIS — E785 Hyperlipidemia, unspecified: Secondary | ICD-10-CM | POA: Insufficient documentation

## 2015-10-01 DIAGNOSIS — I2510 Atherosclerotic heart disease of native coronary artery without angina pectoris: Secondary | ICD-10-CM | POA: Diagnosis not present

## 2015-10-01 DIAGNOSIS — I6523 Occlusion and stenosis of bilateral carotid arteries: Secondary | ICD-10-CM | POA: Diagnosis present

## 2015-10-02 ENCOUNTER — Encounter: Payer: Self-pay | Admitting: Physician Assistant

## 2015-10-02 ENCOUNTER — Telehealth: Payer: Self-pay | Admitting: *Deleted

## 2015-10-02 NOTE — Telephone Encounter (Signed)
Lmtcb for Carotid results

## 2015-10-03 NOTE — Telephone Encounter (Signed)
Pt notified of carotid results by phone with verbal understanding.  

## 2015-10-09 DIAGNOSIS — H35033 Hypertensive retinopathy, bilateral: Secondary | ICD-10-CM | POA: Insufficient documentation

## 2015-11-26 ENCOUNTER — Other Ambulatory Visit: Payer: Self-pay | Admitting: Cardiovascular Disease

## 2016-02-06 ENCOUNTER — Other Ambulatory Visit: Payer: Self-pay | Admitting: Geriatric Medicine

## 2016-02-06 ENCOUNTER — Ambulatory Visit
Admission: RE | Admit: 2016-02-06 | Discharge: 2016-02-06 | Disposition: A | Discharge status: Home / Self Care | Payer: Managed Care, Other (non HMO) | Source: Ambulatory Visit | Attending: Geriatric Medicine | Admitting: Geriatric Medicine

## 2016-02-06 DIAGNOSIS — R05 Cough: Secondary | ICD-10-CM

## 2016-02-06 DIAGNOSIS — R059 Cough, unspecified: Secondary | ICD-10-CM

## 2016-07-11 ENCOUNTER — Other Ambulatory Visit: Payer: Self-pay | Admitting: Cardiovascular Disease

## 2016-07-25 ENCOUNTER — Encounter (INDEPENDENT_AMBULATORY_CARE_PROVIDER_SITE_OTHER): Payer: Self-pay

## 2016-07-25 ENCOUNTER — Ambulatory Visit (INDEPENDENT_AMBULATORY_CARE_PROVIDER_SITE_OTHER): Payer: Managed Care, Other (non HMO) | Admitting: Cardiovascular Disease

## 2016-07-25 ENCOUNTER — Encounter: Payer: Self-pay | Admitting: Cardiovascular Disease

## 2016-07-25 VITALS — BP 140/64 | HR 62 | Ht 73.0 in | Wt 203.2 lb

## 2016-07-25 DIAGNOSIS — I2510 Atherosclerotic heart disease of native coronary artery without angina pectoris: Secondary | ICD-10-CM

## 2016-07-25 DIAGNOSIS — I6523 Occlusion and stenosis of bilateral carotid arteries: Secondary | ICD-10-CM

## 2016-07-25 DIAGNOSIS — I10 Essential (primary) hypertension: Secondary | ICD-10-CM | POA: Diagnosis not present

## 2016-07-25 NOTE — Progress Notes (Signed)
Chief Complaint  Patient presents with  . Follow-up    12 month     History of Present Illness: 81 yo male with history of CAD, HTN, HLD here today for cardiac follow up. He has been followed in the past by Dr. Olevia Perches. I saw him for the first time 02/18/12. He underwent stenting of the right coronary artery in March 2009 with a 3.5 Liberte bare-metal stent. Lexiiscan stress myoview August 2013 showed no large areas of ischemia.  Echo with preserved LV function and no significant valvular issues. Since he had T wave inversions that were worrisome, cardiac cath was arranged August 2013 and showed stable disease with patent stent RCA. I saw him 04/05/13 and he had c/o dizziness and muscle weakness. His statin was held. Carotid artery dopplers March 2017 with mild bilateral ICA stenosis. Stress myoview 07/03/15 low risk with  No large areas of ischemia. He tried Imdur but it made him itch.   He is here today for follow up. He has had no chest pain or SOB.     Primary Care Physician: Mathews Argyle, MD   Past Medical History:  Diagnosis Date  . CAD (coronary artery disease)    Bare meta stent RCA  . Carotid stenosis    a. Carotid US 3/17: bilateral ICA 1-39% >> repeat 2 years  . Diabetes mellitus ([place])   . History of nuclear stress test    Myoview 12/16: EF 66%, very mild inferior ischemia; Low Risk  . HTN (hypertension)   . Hyperlipidemia   . Peripheral neuropathy [facility])     Past Surgical History:  Procedure Laterality Date  . LEFT HEART CATHETERIZATION WITH CORONARY ANGIOGRAM N/A 03/04/2012   Procedure: LEFT HEART CATHETERIZATION WITH CORONARY ANGIOGRAM;  Surgeon: Burnell Blanks, MD;  Location: [facility] CATH LAB;  Service: Cardiovascular;  Laterality: N/A;  . TONSILLECTOMY      Current Outpatient Prescriptions  Medication Sig Dispense Refill  . allopurinol (ZYLOPRIM) 300 MG tablet Take 300 mg by mouth daily.     Marland Kitchen amLODipine (NORVASC) 10 MG tablet Take 1 tablet (10 mg total)  by mouth daily. 30 tablet 0  . aspirin 81 MG tablet Take 1 tablet (81 mg total) by mouth daily. 30 tablet 6  . BENICAR 20 MG tablet Take 20 mg by mouth daily.     . calcium-vitamin D (OSCAL WITH D) 500-200 MG-UNIT per tablet Take 1 tablet by mouth daily. Vitamin D 3  daily    . citalopram (CELEXA) 40 MG tablet Take 40 mg by mouth daily.     . Coenzyme Q10 (CO Q 10 PO) Take by mouth. 1 tab daily    . CRESTOR 20 MG tablet Take 10 mg by mouth daily.     . cyanocobalamin (,VITAMIN B-12,) 1000 MCG/ML injection Inject 1,000 mcg into the muscle every 30 (thirty) days.     . diazepam (VALIUM) 5 MG tablet Take 5 mg by mouth daily as needed for anxiety.     . diphenoxylate-atropine (LOMOTIL) 2.5-0.025 MG per tablet Take 1 tablet by mouth 4 (four) times daily as needed for diarrhea or loose stools.    Marland Kitchen LANTUS SOLOSTAR 100 UNIT/ML injection Inject 54 Units into the skin every morning.     . nabumetone (RELAFEN) 500 MG tablet Take 500 mg by mouth 2 (two) times daily as needed for mild pain.     Marland Kitchen NITROSTAT 0.4 MG SL tablet PLACE 1 TABLET UNDER THE TONGUE EVERY 5 MINUTES AS NEEDED  FOR CHEST PAIN. 25 tablet 1  . sitaGLIPtin (JANUVIA) 50 MG tablet Take 50 mg by mouth daily.    Marland Kitchen zolpidem (AMBIEN) 10 MG tablet Take 5 mg by mouth at bedtime. For sleep     No current facility-administered medications for this visit.     Allergies  Allergen Reactions  . Heparin Hives  . Isosorbide Mononitrate [Isosorbide Dinitrate Er] Hives  . Penicillins Hives  . Sulfonamide Derivatives Hives    Social History   Social History  . Marital status: Married    Spouse name: N/A  . Number of children: N/A  . Years of education: N/A   Occupational History  . Retired-CPA. Former Tourist information centre manager    Social History Main Topics  . Smoking status: Former Smoker    Packs/day: 0.50    Years: 10.00    Types: Cigarettes    Quit date: 02/05/1972  . Smokeless tobacco: Never Used  . Alcohol use 3.5 oz/week    7 drink(s)  per week  . Drug use: No  . Sexual activity: Not on file   Other Topics Concern  . Not on file   Social History Narrative  . No narrative on file    Family History  Problem Relation Age of Onset  . Heart attack Father 21  . Heart failure Mother   . Stroke Mother     Review of Systems:  As stated in the HPI and otherwise negative.   BP 140/64   Pulse 62   Ht 6\' 1"  (1.854 m)   Wt 203 lb 3.2 oz (92.2 kg)   BMI 26.81 kg/m   Physical Examination: General: Well developed, well nourished, NAD  HEENT: OP clear, mucus membranes moist  SKIN: warm, dry. No rashes. Neuro: No focal deficits  Musculoskeletal: Muscle strength 5/5 all ext  Psychiatric: Mood and affect normal  Neck: No JVD, no carotid bruits, no thyromegaly, no lymphadenopathy.  Lungs:Clear bilaterally, no wheezes, rhonci, crackles Cardiovascular: Regular rate and rhythm. No murmurs, gallops or rubs. Abdomen:Soft. Bowel sounds present. Non-tender.  Extremities: No lower extremity edema. Pulses are 2 + in the bilateral DP/PT.  Cardiac cath; 03/04/12:  Left main: No disease noted.  Left Anterior Descending Artery: Large caliber vessel that courses to the apex. There is calcification in the proximal and mid vessel. The proximal vessel has a 50% eccentric stenosis. The mid vessel has a long, smooth 50% stenosis.  Ramus Intermediate: Moderate sized vessel with mild plaque disease.  Circumflex Artery: Large caliber vessel with mild plaque. The first OM branch is very small. The second OM branch is small with a 30% ostial stenosis.  Right Coronary Artery: Large, dominant vessel with 40% proximal stenosis. The mid vessel has a patent stent with minimal in-stent restenosis. The distal vessel has a 30% stenosis. The PDA and PLA are patent mild diffuse plaque disease.  Left Ventricular Angiogram: LVEF 55%.   Carotid artery dopplers 04/06/13: 0-39% bilateral stenosis.   EKG:  EKG is ordered today. The ekg ordered today  demonstrates NSR, rate 62 bpm. 1st degree AV block. Poor R wave progression  Recent Labs: No results found for requested labs within last 8760 hours.   Lipid Panel   Wt Readings from Last 3 Encounters:  07/25/16 203 lb 3.2 oz (92.2 kg)  07/19/15 199 lb 6.4 oz (90.4 kg)  07/03/15 202 lb (91.6 kg)     Other studies Reviewed: Additional studies/ records that were reviewed today include: . Review of the above records  demonstrates:    Assessment and Plan:   1. CAD: No chest pain worrisome for angina. Cath August 2013 with stable moderate CAD. Stress myoview December 2016 was low risk. Continue medical therapy. He is tolerating low dose statin.     2. HTN: BP at the upper limits of normal. He will follow at home and call with readings over [id number] systolic. Continue current meds.    3. Carotid artery disease: Mild bilateral carotid disease by dopplers March 2017. Will repeat dopplers in March 2019.   Current medicines are reviewed at length with the patient today.  The patient does not have concerns regarding medicines.  The following changes have been made:  no change  Labs/ tests ordered today include:   Orders Placed This Encounter  Procedures  . EKG 12-Lead    Disposition:   FU with me in 12  months  Signed, Lauree Chandler, MD 07/25/2016 12:31 PM    [facility] [place], [place], [place]  [address] Phone: [phone]; Fax: [phone]

## 2016-07-25 NOTE — Patient Instructions (Signed)

## 2016-08-08 ENCOUNTER — Other Ambulatory Visit: Payer: Self-pay | Admitting: Cardiovascular Disease

## 2016-10-01 ENCOUNTER — Other Ambulatory Visit: Payer: Self-pay | Admitting: Geriatric Medicine

## 2016-10-01 ENCOUNTER — Ambulatory Visit
Admission: RE | Admit: 2016-10-01 | Discharge: 2016-10-01 | Disposition: A | Discharge status: Home / Self Care | Payer: Managed Care, Other (non HMO) | Source: Ambulatory Visit | Attending: Geriatric Medicine | Admitting: Geriatric Medicine

## 2016-10-01 DIAGNOSIS — M545 Low back pain, unspecified: Secondary | ICD-10-CM

## 2017-04-08 DIAGNOSIS — Z23 Encounter for immunization: Secondary | ICD-10-CM | POA: Diagnosis not present

## 2017-04-08 DIAGNOSIS — Z0000 Encounter for general adult medical examination without abnormal findings: Secondary | ICD-10-CM | POA: Diagnosis not present

## 2017-04-08 DIAGNOSIS — G4733 Obstructive sleep apnea (adult) (pediatric): Secondary | ICD-10-CM | POA: Diagnosis not present

## 2017-04-08 DIAGNOSIS — Z79899 Other long term (current) drug therapy: Secondary | ICD-10-CM | POA: Diagnosis not present

## 2017-04-08 DIAGNOSIS — N401 Enlarged prostate with lower urinary tract symptoms: Secondary | ICD-10-CM | POA: Diagnosis not present

## 2017-04-08 DIAGNOSIS — E7800 Pure hypercholesterolemia, unspecified: Secondary | ICD-10-CM | POA: Diagnosis not present

## 2017-04-08 DIAGNOSIS — I10 Essential (primary) hypertension: Secondary | ICD-10-CM | POA: Diagnosis not present

## 2017-04-08 DIAGNOSIS — E1129 Type 2 diabetes mellitus with other diabetic kidney complication: Secondary | ICD-10-CM | POA: Diagnosis not present

## 2017-04-08 DIAGNOSIS — E1140 Type 2 diabetes mellitus with diabetic neuropathy, unspecified: Secondary | ICD-10-CM | POA: Diagnosis not present

## 2017-04-08 DIAGNOSIS — R202 Paresthesia of skin: Secondary | ICD-10-CM | POA: Diagnosis not present

## 2017-04-08 DIAGNOSIS — F325 Major depressive disorder, single episode, in full remission: Secondary | ICD-10-CM | POA: Diagnosis not present

## 2017-04-08 DIAGNOSIS — E113299 Type 2 diabetes mellitus with mild nonproliferative diabetic retinopathy without macular edema, unspecified eye: Secondary | ICD-10-CM | POA: Diagnosis not present

## 2017-04-08 DIAGNOSIS — R809 Proteinuria, unspecified: Secondary | ICD-10-CM | POA: Diagnosis not present

## 2017-05-19 DIAGNOSIS — L821 Other seborrheic keratosis: Secondary | ICD-10-CM | POA: Diagnosis not present

## 2017-05-19 DIAGNOSIS — D1801 Hemangioma of skin and subcutaneous tissue: Secondary | ICD-10-CM | POA: Diagnosis not present

## 2017-05-19 DIAGNOSIS — L570 Actinic keratosis: Secondary | ICD-10-CM | POA: Diagnosis not present

## 2017-05-19 DIAGNOSIS — Z85828 Personal history of other malignant neoplasm of skin: Secondary | ICD-10-CM | POA: Diagnosis not present

## 2017-05-19 DIAGNOSIS — D485 Neoplasm of uncertain behavior of skin: Secondary | ICD-10-CM | POA: Diagnosis not present

## 2017-05-19 DIAGNOSIS — L905 Scar conditions and fibrosis of skin: Secondary | ICD-10-CM | POA: Diagnosis not present

## 2017-05-19 DIAGNOSIS — L738 Other specified follicular disorders: Secondary | ICD-10-CM | POA: Diagnosis not present

## 2017-06-04 DIAGNOSIS — H2512 Age-related nuclear cataract, left eye: Secondary | ICD-10-CM | POA: Diagnosis not present

## 2017-06-04 DIAGNOSIS — H25812 Combined forms of age-related cataract, left eye: Secondary | ICD-10-CM | POA: Diagnosis not present

## 2017-06-11 ENCOUNTER — Encounter: Payer: Self-pay | Admitting: *Deleted

## 2017-06-11 ENCOUNTER — Other Ambulatory Visit: Payer: Self-pay | Admitting: *Deleted

## 2017-06-11 NOTE — Patient Outreach (Addendum)
[facility]) Care Management  06/11/2017  [name] 06/06/1936 [id number]  Health Risk Assessment   Successful outreach call, explained the purpose of the call. Patient states he is in good health, discussed medical condition of Diabetes, reports he checks blood sugar daily, with reading stored in meter, reading this morning 110. Patient discussed he has been to classes in the past and tries eating a balanced diet. Reports understanding of symptoms of low blood sugar and how to treat denies having recent episodes.  Patient discussed taking insulin daily, remains active , denies cost concerns related to being on more than 5 medications.  Patient denies having any questions. Will send successful outreach letter, with [facility] care management contact information.  Joylene Draft, RN, [place] Management Coordinator  [phone]- Mobile [phone]- Toll Free Main Office

## 2017-07-20 ENCOUNTER — Other Ambulatory Visit: Payer: Self-pay | Admitting: Cardiovascular Disease

## 2017-07-20 ENCOUNTER — Encounter (INDEPENDENT_AMBULATORY_CARE_PROVIDER_SITE_OTHER): Payer: Self-pay

## 2017-07-20 ENCOUNTER — Encounter: Payer: Self-pay | Admitting: Cardiovascular Disease

## 2017-07-20 ENCOUNTER — Ambulatory Visit: Payer: PPO | Admitting: Cardiovascular Disease

## 2017-07-20 VITALS — BP 132/68 | HR 65 | Ht 73.0 in | Wt 206.4 lb

## 2017-07-20 DIAGNOSIS — I2510 Atherosclerotic heart disease of native coronary artery without angina pectoris: Secondary | ICD-10-CM

## 2017-07-20 DIAGNOSIS — I6523 Occlusion and stenosis of bilateral carotid arteries: Secondary | ICD-10-CM

## 2017-07-20 DIAGNOSIS — I10 Essential (primary) hypertension: Secondary | ICD-10-CM | POA: Diagnosis not present

## 2017-07-20 MED ORDER — NITROGLYCERIN 0.4 MG SL SUBL: SUBLINGUAL_TABLET | SUBLINGUAL | 5 refills | Status: DC

## 2017-07-20 NOTE — Patient Instructions (Signed)
Medication Instructions:  Your physician recommends that you continue on your current medications as directed. Please refer to the Current Medication list given to you today.   Labwork: none  Testing/Procedures: Your physician has requested that you have a carotid duplex. This test is an ultrasound of the carotid arteries in your neck. It looks at blood flow through these arteries that supply the brain with blood. Allow one hour for this exam. There are no restrictions or special instructions.  To be done in March 2019  Follow-Up: Your physician recommends that you schedule a follow-up appointment in: 12 months. Please call our office in about 9 months to schedule this appointment   Any Other Special Instructions Will Be Listed Below (If Applicable).     If you need a refill on your cardiac medications before your next appointment, please call your pharmacy.

## 2017-07-20 NOTE — Progress Notes (Signed)
Chief Complaint  Patient presents with  . Follow-up    cad     History of Present Illness: 82 yo male with history of CAD, HTN, HLD here today for cardiac follow up. He has been followed in the past by Dr. Olevia Perches. I saw him for the first time 02/18/12. He underwent stenting of the right coronary artery in March 2009 with a bare metal stent. Nuclear stress test August 2013 showed no large areas of ischemia.  Echo with preserved LV function and no significant valvular issues. Since he had T wave inversions that were worrisome on the stress test, cardiac cath was arranged August 2013 and showed stable disease with patent stent RCA. I saw him 04/05/13 and he had c/o dizziness and muscle weakness. His statin was held. Carotid artery dopplers March 2017 with mild bilateral ICA stenosis. Stress myoview 07/03/15 low risk with no large areas of ischemia. He tried Imdur but it made him itch.   He is here today for follow up. The patient denies any chest pain, dyspnea, palpitations, lower extremity edema, orthopnea, PND, dizziness, near syncope or syncope.   Primary Care Physician: Lajean Manes, MD  Past Medical History:  Diagnosis Date  . CAD (coronary artery disease)    Bare meta stent RCA  . Carotid stenosis    a. Carotid US 3/17: bilateral ICA 1-39% >> repeat 2 years  . Diabetes mellitus ([place])   . History of nuclear stress test    Myoview 12/16: EF 66%, very mild inferior ischemia; Low Risk  . HTN (hypertension)   . Hyperlipidemia   . Peripheral neuropathy     Past Surgical History:  Procedure Laterality Date  . LEFT HEART CATHETERIZATION WITH CORONARY ANGIOGRAM N/A 03/04/2012   Procedure: LEFT HEART CATHETERIZATION WITH CORONARY ANGIOGRAM;  Surgeon: Burnell Blanks, MD;  Location: [facility] CATH LAB;  Service: Cardiovascular;  Laterality: N/A;  . TONSILLECTOMY      Current Outpatient Medications  Medication Sig Dispense Refill  . allopurinol (ZYLOPRIM) 300 MG tablet Take 300 mg by  mouth daily.     Marland Kitchen amLODipine (NORVASC) 10 MG tablet Take 1 tablet (10 mg total) by mouth daily. 30 tablet 11  . aspirin 81 MG tablet Take 1 tablet (81 mg total) by mouth daily. 30 tablet 6  . BENICAR 20 MG tablet Take 20 mg by mouth daily.     . calcium-vitamin D (OSCAL WITH D) 500-200 MG-UNIT per tablet Take 1 tablet by mouth daily. Vitamin D 3  daily    . citalopram (CELEXA) 40 MG tablet Take 40 mg by mouth daily.     . Coenzyme Q10 (CO Q 10 PO) Take by mouth. 1 tab daily    . CRESTOR 20 MG tablet Take 10 mg by mouth daily.     . cyanocobalamin (,VITAMIN B-12,) 1000 MCG/ML injection Inject 1,000 mcg into the muscle every 30 (thirty) days.     . diazepam (VALIUM) 5 MG tablet Take 5 mg by mouth daily as needed for anxiety.     . diphenoxylate-atropine (LOMOTIL) 2.5-0.025 MG per tablet Take 1 tablet by mouth 4 (four) times daily as needed for diarrhea or loose stools.    . hydrochlorothiazide (HYDRODIURIL) 25 MG tablet Take 25 mg by mouth daily.    Marland Kitchen LANTUS SOLOSTAR 100 UNIT/ML injection Inject 54 Units into the skin every morning.     . nabumetone (RELAFEN) 500 MG tablet Take 500 mg by mouth 2 (two) times daily as needed for mild  pain.     . nitroGLYCERIN (NITROSTAT) 0.4 MG SL tablet PLACE 1 TABLET UNDER THE TONGUE EVERY 5 MINUTES AS NEEDED FOR CHEST PAIN. 25 tablet 5  . sitaGLIPtin (JANUVIA) 50 MG tablet Take 50 mg by mouth daily.    Marland Kitchen zolpidem (AMBIEN) 10 MG tablet Take 5 mg by mouth at bedtime. For sleep     No current facility-administered medications for this visit.     Allergies  Allergen Reactions  . Heparin Hives  . Isosorbide Mononitrate [Isosorbide Dinitrate Er] Hives  . Penicillins Hives  . Sulfonamide Derivatives Hives    Social History   Socioeconomic History  . Marital status: Married    Spouse name: Not on file  . Number of children: Not on file  . Years of education: Not on file  . Highest education level: Not on file  Social Needs  . Financial resource strain:  Not on file  . Food insecurity - worry: Not on file  . Food insecurity - inability: Not on file  . Transportation needs - medical: Not on file  . Transportation needs - non-medical: Not on file  Occupational History  . Occupation: Retired-CPA. Former Tourist information centre manager  Tobacco Use  . Smoking status: Former Smoker    Packs/day: 0.50    Years: 10.00    Pack years: 5.00    Types: Cigarettes    Last attempt to quit: 02/05/1972    Years since quitting: 45.4  . Smokeless tobacco: Never Used  Substance and Sexual Activity  . Alcohol use: Yes    Alcohol/week: 3.5 oz    Types: 7 drink(s) per week  . Drug use: No  . Sexual activity: Not on file  Other Topics Concern  . Not on file  Social History Narrative  . Not on file    Family History  Problem Relation Age of Onset  . Heart attack Father 70  . Heart failure Mother   . Stroke Mother     Review of Systems:  As stated in the HPI and otherwise negative.   BP 132/68   Pulse 65   Ht 6\' 1"  (1.854 m)   Wt 206 lb 6.4 oz (93.6 kg)   SpO2 95%   BMI 27.23 kg/m   Physical Examination:  General: Well developed, well nourished, NAD  HEENT: OP clear, mucus membranes moist  SKIN: warm, dry. No rashes. Neuro: No focal deficits  Musculoskeletal: Muscle strength 5/5 all ext  Psychiatric: Mood and affect normal  Neck: No JVD, no carotid bruits, no thyromegaly, no lymphadenopathy.  Lungs:Clear bilaterally, no wheezes, rhonci, crackles Cardiovascular: Regular rate and rhythm. No murmurs, gallops or rubs. Abdomen:Soft. Bowel sounds present. Non-tender.  Extremities: No lower extremity edema. Pulses are 2 + in the bilateral DP/PT.  Cardiac cath; 03/04/12:  Left main: No disease noted.  Left Anterior Descending Artery: Large caliber vessel that courses to the apex. There is calcification in the proximal and mid vessel. The proximal vessel has a 50% eccentric stenosis. The mid vessel has a long, smooth 50% stenosis.  Ramus Intermediate:  Moderate sized vessel with mild plaque disease.  Circumflex Artery: Large caliber vessel with mild plaque. The first OM branch is very small. The second OM branch is small with a 30% ostial stenosis.  Right Coronary Artery: Large, dominant vessel with 40% proximal stenosis. The mid vessel has a patent stent with minimal in-stent restenosis. The distal vessel has a 30% stenosis. The PDA and PLA are patent mild diffuse plaque disease.  Left Ventricular Angiogram: LVEF 55%.   Carotid artery dopplers 04/06/13: 0-39% bilateral stenosis.   EKG:  EKG is  ordered today. The ekg ordered today demonstrates Sinus, rate 65bpm. 1st degree AV block. LAFB.   Recent Labs: No results found for requested labs within last 8760 hours.   Lipid Panel   Wt Readings from Last 3 Encounters:  07/20/17 206 lb 6.4 oz (93.6 kg)  07/25/16 203 lb 3.2 oz (92.2 kg)  07/19/15 199 lb 6.4 oz (90.4 kg)     Other studies Reviewed: Additional studies/ records that were reviewed today include: . Review of the above records demonstrates:    Assessment and Plan:   1. CAD without angina: He has no chest pain suggestive of angina. Last cath in August 2013 with moderate CAD. Stress myoview December 2016 was low risk. Will continue ASA and statin.      2. HTN: BP is controlled. NO changes.    3. Carotid artery disease: Mild bilateral carotid disease by dopplers March 2017. Repeat dopplers March 2019.   Current medicines are reviewed at length with the patient today.  The patient does not have concerns regarding medicines.  The following changes have been made:  no change  Labs/ tests ordered today include:   Orders Placed This Encounter  Procedures  . EKG 12-Lead    Disposition:   FU with me in 12  months  Signed, Lauree Chandler, MD 07/20/2017 12:03 PM    [facility], [place], [place]  [address] Phone: [phone]; Fax: [phone]

## 2017-08-03 ENCOUNTER — Other Ambulatory Visit: Payer: Self-pay | Admitting: Cardiovascular Disease

## 2017-08-11 ENCOUNTER — Other Ambulatory Visit: Payer: Self-pay | Admitting: Geriatric Medicine

## 2017-08-11 ENCOUNTER — Ambulatory Visit
Admission: RE | Admit: 2017-08-11 | Discharge: 2017-08-11 | Disposition: A | Discharge status: Home / Self Care | Payer: PPO | Source: Ambulatory Visit | Attending: Geriatric Medicine | Admitting: Geriatric Medicine

## 2017-08-11 DIAGNOSIS — R05 Cough: Secondary | ICD-10-CM

## 2017-08-11 DIAGNOSIS — I10 Essential (primary) hypertension: Secondary | ICD-10-CM | POA: Diagnosis not present

## 2017-08-11 DIAGNOSIS — J309 Allergic rhinitis, unspecified: Secondary | ICD-10-CM | POA: Diagnosis not present

## 2017-08-11 DIAGNOSIS — D696 Thrombocytopenia, unspecified: Secondary | ICD-10-CM | POA: Diagnosis not present

## 2017-08-11 DIAGNOSIS — E113299 Type 2 diabetes mellitus with mild nonproliferative diabetic retinopathy without macular edema, unspecified eye: Secondary | ICD-10-CM | POA: Diagnosis not present

## 2017-08-11 DIAGNOSIS — R059 Cough, unspecified: Secondary | ICD-10-CM

## 2017-08-11 DIAGNOSIS — Z7984 Long term (current) use of oral hypoglycemic drugs: Secondary | ICD-10-CM | POA: Diagnosis not present

## 2017-08-11 DIAGNOSIS — Z794 Long term (current) use of insulin: Secondary | ICD-10-CM | POA: Diagnosis not present

## 2017-08-11 DIAGNOSIS — E1140 Type 2 diabetes mellitus with diabetic neuropathy, unspecified: Secondary | ICD-10-CM | POA: Diagnosis not present

## 2017-08-11 DIAGNOSIS — G4733 Obstructive sleep apnea (adult) (pediatric): Secondary | ICD-10-CM | POA: Diagnosis not present

## 2017-08-11 DIAGNOSIS — R234 Changes in skin texture: Secondary | ICD-10-CM | POA: Diagnosis not present

## 2017-08-13 DIAGNOSIS — H25811 Combined forms of age-related cataract, right eye: Secondary | ICD-10-CM | POA: Diagnosis not present

## 2017-08-13 DIAGNOSIS — H2511 Age-related nuclear cataract, right eye: Secondary | ICD-10-CM | POA: Diagnosis not present

## 2017-09-08 DIAGNOSIS — Z85828 Personal history of other malignant neoplasm of skin: Secondary | ICD-10-CM | POA: Diagnosis not present

## 2017-09-08 DIAGNOSIS — L570 Actinic keratosis: Secondary | ICD-10-CM | POA: Diagnosis not present

## 2017-09-15 ENCOUNTER — Ambulatory Visit (HOSPITAL_COMMUNITY)
Admission: RE | Admit: 2017-09-15 | Discharge: 2017-09-15 | Disposition: A | Discharge status: Home / Self Care | Payer: PPO | Source: Ambulatory Visit | Attending: Cardiovascular Disease | Admitting: Cardiovascular Disease

## 2017-09-15 DIAGNOSIS — I6523 Occlusion and stenosis of bilateral carotid arteries: Secondary | ICD-10-CM

## 2017-10-19 DIAGNOSIS — Z794 Long term (current) use of insulin: Secondary | ICD-10-CM | POA: Diagnosis not present

## 2017-10-19 DIAGNOSIS — R269 Unspecified abnormalities of gait and mobility: Secondary | ICD-10-CM | POA: Diagnosis not present

## 2017-10-19 DIAGNOSIS — G3184 Mild cognitive impairment, so stated: Secondary | ICD-10-CM | POA: Diagnosis not present

## 2017-10-19 DIAGNOSIS — E1140 Type 2 diabetes mellitus with diabetic neuropathy, unspecified: Secondary | ICD-10-CM | POA: Diagnosis not present

## 2017-10-19 DIAGNOSIS — N529 Male erectile dysfunction, unspecified: Secondary | ICD-10-CM | POA: Diagnosis not present

## 2017-10-19 DIAGNOSIS — I10 Essential (primary) hypertension: Secondary | ICD-10-CM | POA: Diagnosis not present

## 2017-10-19 DIAGNOSIS — E538 Deficiency of other specified B group vitamins: Secondary | ICD-10-CM | POA: Diagnosis not present

## 2017-10-20 ENCOUNTER — Encounter: Payer: Self-pay | Admitting: Neurology

## 2017-10-26 ENCOUNTER — Ambulatory Visit: Payer: PPO | Admitting: Neurology

## 2017-10-26 ENCOUNTER — Encounter: Payer: Self-pay | Admitting: Neurology

## 2017-10-26 VITALS — BP 110/62 | HR 67 | Ht 74.0 in | Wt 205.0 lb

## 2017-10-26 DIAGNOSIS — E1142 Type 2 diabetes mellitus with diabetic polyneuropathy: Secondary | ICD-10-CM | POA: Diagnosis not present

## 2017-10-26 DIAGNOSIS — Z794 Long term (current) use of insulin: Secondary | ICD-10-CM

## 2017-10-26 DIAGNOSIS — R2681 Unsteadiness on feet: Secondary | ICD-10-CM | POA: Diagnosis not present

## 2017-10-26 NOTE — Patient Instructions (Signed)
Most likely neuropathy from diabetes is the cause of balance problems.  I will send you for physical therapy Follow up in 3 to 4 months.  If not improving or getting worse, we will pursue other testing

## 2017-10-26 NOTE — Progress Notes (Signed)
NEUROLOGY CONSULTATION NOTE  [name] MRN: [id number] DOB: 1935/10/01  Referring provider: Dr. Felipa Eth Primary care provider: Dr. Felipa Eth  Reason for consult:  Unsteady gait  HISTORY OF PRESENT ILLNESS: [name] is an 82 year old right-handed male with hypertension, mild cognitive impairment, CAD status post MI, hyperlipidemia, sick sinus syndrome, and type 2 diabetes mellitus who presents for worsening gait.  He is accompanied by his wife who supplements history.  He and his wife began noticing problems with balance about 6 months ago, which has gradually gotten worse.  When walking, he feels off-balance.  He feels that he is taking shorter steps and dragging his heels, right foot worse than left foot.  He fell one time while standing at the commode to urinate, he lost his balance and fell forward.  He says his legs feel weak.  While he has some neuropathic pain in the feet, he denies sensation of numbness or difficulty feeling the ground when standing.  He has history of low back pain with lumbar radiculopathy, but currently denies low back or radicular pain in the legs.  He denies change in bowel or bladder function, although he does wake up during the night to urinate.  He denies neck pain or symptoms in the upper extremities.  He denies difficulty with vision.  He denies freezing, tremor or difficulty swallowing.  He has some short term memory problems but is independent in regards to all simple and complex activities of daily living.    He has uncontrolled type 2 diabetes.  He reports that his last Hgb A1c from 2 months ago was 7.7.  A B12 level recently checked was reportedly borderline and he is now taking over the counter supplements.  Family history is notable as his brother has dementia with Lewy Body disease and his mother had history of memory difficulty.  Carotid doppler from 09/15/17 demonstrated no hemodynamically significant ICA stenosis with antegrade flow  in both vertebral arteries.  Old MRI of lumbar spine from 12/04/09 was personally reviewed and demonstrated right L4-L5 synovial cyst arising from the facet joint, compressing the right L5 nerve root.  PAST MEDICAL HISTORY: Past Medical History:  Diagnosis Date  . CAD (coronary artery disease)    Bare meta stent RCA  . Carotid stenosis    a. Carotid US 3/17: bilateral ICA 1-39% >> repeat 2 years  . Diabetes mellitus ([place])   . History of nuclear stress test    Myoview 12/16: EF 66%, very mild inferior ischemia; Low Risk  . HTN (hypertension)   . Hyperlipidemia   . Peripheral neuropathy     PAST SURGICAL HISTORY: Past Surgical History:  Procedure Laterality Date  . LEFT HEART CATHETERIZATION WITH CORONARY ANGIOGRAM N/A 03/04/2012   Procedure: LEFT HEART CATHETERIZATION WITH CORONARY ANGIOGRAM;  Surgeon: Burnell Blanks, MD;  Location: [facility] CATH LAB;  Service: Cardiovascular;  Laterality: N/A;  . TONSILLECTOMY      MEDICATIONS: Current Outpatient Medications on File Prior to Visit  Medication Sig Dispense Refill  . vitamin B-12 (CYANOCOBALAMIN) 500 MCG tablet Take 500 mcg by mouth daily.    Marland Kitchen allopurinol (ZYLOPRIM) 300 MG tablet Take 300 mg by mouth daily.     Marland Kitchen amLODipine (NORVASC) 10 MG tablet TAKE 1 TABLET ONCE DAILY. 30 tablet 11  . aspirin 81 MG tablet Take 1 tablet (81 mg total) by mouth daily. 30 tablet 6  . BENICAR 20 MG tablet Take 20 mg by mouth daily.     Marland Kitchen  calcium-vitamin D (OSCAL WITH D) 500-200 MG-UNIT per tablet Take 1 tablet by mouth daily. Vitamin D 3  daily    . citalopram (CELEXA) 40 MG tablet Take 40 mg by mouth daily.     . Coenzyme Q10 (CO Q 10 PO) Take by mouth. 1 tab daily    . CRESTOR 20 MG tablet Take 10 mg by mouth daily.     . cyanocobalamin (,VITAMIN B-12,) 1000 MCG/ML injection Inject 1,000 mcg into the muscle every 30 (thirty) days.     . diazepam (VALIUM) 5 MG tablet Take 5 mg by mouth daily as needed for anxiety.     . diphenoxylate-atropine  (LOMOTIL) 2.5-0.025 MG per tablet Take 1 tablet by mouth 4 (four) times daily as needed for diarrhea or loose stools.    . hydrochlorothiazide (HYDRODIURIL) 25 MG tablet TAKE 1 TABLET ONCE DAILY. 90 tablet 3  . LANTUS SOLOSTAR 100 UNIT/ML injection Inject 54 Units into the skin every morning.     . nabumetone (RELAFEN) 500 MG tablet Take 500 mg by mouth 2 (two) times daily as needed for mild pain.     . nitroGLYCERIN (NITROSTAT) 0.4 MG SL tablet PLACE 1 TABLET UNDER THE TONGUE EVERY 5 MINUTES AS NEEDED FOR CHEST PAIN. 25 tablet 5  . sitaGLIPtin (JANUVIA) 50 MG tablet Take 50 mg by mouth daily.    Marland Kitchen zolpidem (AMBIEN) 10 MG tablet Take 5 mg by mouth at bedtime. For sleep     No current facility-administered medications on file prior to visit.     ALLERGIES: Allergies  Allergen Reactions  . Heparin Hives  . Isosorbide Mononitrate [Isosorbide Dinitrate Er] Hives  . Penicillins Hives  . Sulfonamide Derivatives Hives    FAMILY HISTORY: Family History  Problem Relation Age of Onset  . Heart failure Mother   . Stroke Mother   . Heart attack Father 41    SOCIAL HISTORY: Social History   Socioeconomic History  . Marital status: Married    Spouse name: [name]  . Number of children: 3  . Years of education: Not on file  . Highest education level: Bachelor's degree (e.g., BA, AB, BS)  Occupational History  . Occupation: Retired-CPA. Former CEO [employer]  . Financial resource strain: Not on file  . Food insecurity:    Worry: Not on file    Inability: Not on file  . Transportation needs:    Medical: Not on file    Non-medical: Not on file  Tobacco Use  . Smoking status: Former Smoker    Packs/day: 0.50    Years: 10.00    Pack years: 5.00    Types: Cigarettes    Last attempt to quit: 02/05/1972    Years since quitting: 45.7  . Smokeless tobacco: Never Used  Substance and Sexual Activity  . Alcohol use: Yes    Alcohol/week: 3.5 oz    Types: 7 Standard drinks  or equivalent per week  . Drug use: No  . Sexual activity: Not on file  Lifestyle  . Physical activity:    Days per week: Not on file    Minutes per session: Not on file  . Stress: Not on file  Relationships  . Social connections:    Talks on phone: Not on file    Gets together: Not on file    Attends religious service: Not on file    Active member of club or organization: Not on file    Attends meetings of clubs  or organizations: Not on file    Relationship status: Not on file  . Intimate partner violence:    Fear of current or ex partner: Not on file    Emotionally abused: Not on file    Physically abused: Not on file    Forced sexual activity: Not on file  Other Topics Concern  . Not on file  Social History Narrative   Married, lives with wife in a one story home. [facility])  Pt is right handed. He drinks hot tea 5x week.    REVIEW OF SYSTEMS: Constitutional: No fevers, chills, or sweats, no generalized fatigue, change in appetite Eyes: No visual changes, double vision, eye pain Ear, nose and throat: No hearing loss, ear pain, nasal congestion, sore throat Cardiovascular: No chest pain, palpitations Respiratory:  No shortness of breath at rest or with exertion, wheezes GastrointestinaI: No nausea, vomiting, diarrhea, abdominal pain, fecal incontinence Genitourinary:  No dysuria, urinary retention or frequency Musculoskeletal:  No neck pain, back pain Integumentary: No rash, pruritus, skin lesions Neurological: as above Psychiatric: No depression, insomnia, anxiety Endocrine: No palpitations, fatigue, diaphoresis, mood swings, change in appetite, change in weight, increased thirst Hematologic/Lymphatic:  No purpura, petechiae. Allergic/Immunologic: no itchy/runny eyes, nasal congestion, recent allergic reactions, rashes  PHYSICAL EXAM: Vitals:   10/26/17 1014  BP: 110/62  Pulse: 67  SpO2: 94%   General: No acute distress.  Patient appears well-groomed.  Head:   Normocephalic/atraumatic Eyes:  fundi examined but not visualized Neck: supple, no paraspinal tenderness, full range of motion Back: No paraspinal tenderness Heart: regular rate and rhythm Lungs: Clear to auscultation bilaterally. Vascular: No carotid bruits. Neurological Exam: Mental status: alert and oriented to person, place, and time, recent and remote memory intact, fund of knowledge intact, attention and concentration intact, speech fluent and not dysarthric, language intact. Cranial nerves: CN I: not tested CN II: pupils equal, round and reactive to light, visual fields intact CN III, IV, VI:  full range of motion, no nystagmus, no ptosis CN V: facial sensation intact CN VII: upper and lower face symmetric CN VIII: hearing intact CN IX, X: gag intact, uvula midline CN XI: sternocleidomastoid and trapezius muscles intact CN XII: tongue midline Bulk & Tone: normal, no fasciculations. Motor:  5/5 throughout  Sensation:  Pinprick sensation intact; vibration sensation reduced in feet. Deep Tendon Reflexes:  2+ throughout, toes downgoing. Finger to nose testing:  Without dysmetria.  Heel to shin:  Without dysmetria.  Gait:  Upright posture with arm swing.  Unsteady.  No suffling.  Able to turn in 2 steps, difficulty with tandem walk. Romberg negative.  IMPRESSION: Unsteady gait.  Gradual onset of symptoms makes stroke unlikely.  He does not exhibit focal or lateralizing symptoms to suggest an intracranial abnormality such as a mass lesion.  He does not exhibit myelopathic signs to suggest spinal cord involvement.  He does not exhibit back pain or claudication to suggest lumbar stenosis.  He does not exhibit any objective weakness on testing.  He does not exhibit symptoms or characteristic gait to suggest Parkinson's disease or normal pressure hydrocephalus.  Based on exam and history, I suspect peripheral neuropathy secondary to uncontrolled type 2 diabetes mellitus.  PLAN: I will  refer him for physical therapy.  He will return in 3 months.  If not improved or getting worse, then consider further investigation.  Thank you for allowing me to take part in the care of this patient.  [name], DO  CC:  Hal Stoneking,  MD     

## 2017-10-30 ENCOUNTER — Encounter: Payer: Self-pay | Admitting: Neurology

## 2017-11-05 DIAGNOSIS — R278 Other lack of coordination: Secondary | ICD-10-CM | POA: Diagnosis not present

## 2017-11-05 DIAGNOSIS — M25652 Stiffness of left hip, not elsewhere classified: Secondary | ICD-10-CM | POA: Diagnosis not present

## 2017-11-05 DIAGNOSIS — E1342 Other specified diabetes mellitus with diabetic polyneuropathy: Secondary | ICD-10-CM | POA: Diagnosis not present

## 2017-11-05 DIAGNOSIS — M25552 Pain in left hip: Secondary | ICD-10-CM | POA: Diagnosis not present

## 2017-11-05 DIAGNOSIS — M25651 Stiffness of right hip, not elsewhere classified: Secondary | ICD-10-CM | POA: Diagnosis not present

## 2017-11-05 DIAGNOSIS — M25551 Pain in right hip: Secondary | ICD-10-CM | POA: Diagnosis not present

## 2017-11-05 DIAGNOSIS — M6259 Muscle wasting and atrophy, not elsewhere classified, multiple sites: Secondary | ICD-10-CM | POA: Diagnosis not present

## 2017-11-05 DIAGNOSIS — Z9181 History of falling: Secondary | ICD-10-CM | POA: Diagnosis not present

## 2017-11-05 DIAGNOSIS — R2689 Other abnormalities of gait and mobility: Secondary | ICD-10-CM | POA: Diagnosis not present

## 2017-11-09 DIAGNOSIS — M25651 Stiffness of right hip, not elsewhere classified: Secondary | ICD-10-CM | POA: Diagnosis not present

## 2017-11-09 DIAGNOSIS — R278 Other lack of coordination: Secondary | ICD-10-CM | POA: Diagnosis not present

## 2017-11-09 DIAGNOSIS — M25551 Pain in right hip: Secondary | ICD-10-CM | POA: Diagnosis not present

## 2017-11-09 DIAGNOSIS — R2689 Other abnormalities of gait and mobility: Secondary | ICD-10-CM | POA: Diagnosis not present

## 2017-11-09 DIAGNOSIS — M6259 Muscle wasting and atrophy, not elsewhere classified, multiple sites: Secondary | ICD-10-CM | POA: Diagnosis not present

## 2017-11-09 DIAGNOSIS — M25552 Pain in left hip: Secondary | ICD-10-CM | POA: Diagnosis not present

## 2017-11-09 DIAGNOSIS — Z9181 History of falling: Secondary | ICD-10-CM | POA: Diagnosis not present

## 2017-11-09 DIAGNOSIS — E1342 Other specified diabetes mellitus with diabetic polyneuropathy: Secondary | ICD-10-CM | POA: Diagnosis not present

## 2017-11-09 DIAGNOSIS — M25652 Stiffness of left hip, not elsewhere classified: Secondary | ICD-10-CM | POA: Diagnosis not present

## 2017-11-11 DIAGNOSIS — E1342 Other specified diabetes mellitus with diabetic polyneuropathy: Secondary | ICD-10-CM | POA: Diagnosis not present

## 2017-11-11 DIAGNOSIS — M6259 Muscle wasting and atrophy, not elsewhere classified, multiple sites: Secondary | ICD-10-CM | POA: Diagnosis not present

## 2017-11-11 DIAGNOSIS — Z9181 History of falling: Secondary | ICD-10-CM | POA: Diagnosis not present

## 2017-11-11 DIAGNOSIS — M25551 Pain in right hip: Secondary | ICD-10-CM | POA: Diagnosis not present

## 2017-11-11 DIAGNOSIS — M25651 Stiffness of right hip, not elsewhere classified: Secondary | ICD-10-CM | POA: Diagnosis not present

## 2017-11-11 DIAGNOSIS — M25552 Pain in left hip: Secondary | ICD-10-CM | POA: Diagnosis not present

## 2017-11-11 DIAGNOSIS — R2689 Other abnormalities of gait and mobility: Secondary | ICD-10-CM | POA: Diagnosis not present

## 2017-11-11 DIAGNOSIS — R278 Other lack of coordination: Secondary | ICD-10-CM | POA: Diagnosis not present

## 2017-11-11 DIAGNOSIS — M25652 Stiffness of left hip, not elsewhere classified: Secondary | ICD-10-CM | POA: Diagnosis not present

## 2017-11-17 DIAGNOSIS — M25651 Stiffness of right hip, not elsewhere classified: Secondary | ICD-10-CM | POA: Diagnosis not present

## 2017-11-17 DIAGNOSIS — Z9181 History of falling: Secondary | ICD-10-CM | POA: Diagnosis not present

## 2017-11-17 DIAGNOSIS — M25551 Pain in right hip: Secondary | ICD-10-CM | POA: Diagnosis not present

## 2017-11-17 DIAGNOSIS — E1342 Other specified diabetes mellitus with diabetic polyneuropathy: Secondary | ICD-10-CM | POA: Diagnosis not present

## 2017-11-17 DIAGNOSIS — R2689 Other abnormalities of gait and mobility: Secondary | ICD-10-CM | POA: Diagnosis not present

## 2017-11-17 DIAGNOSIS — R278 Other lack of coordination: Secondary | ICD-10-CM | POA: Diagnosis not present

## 2017-11-17 DIAGNOSIS — M25652 Stiffness of left hip, not elsewhere classified: Secondary | ICD-10-CM | POA: Diagnosis not present

## 2017-11-17 DIAGNOSIS — M25552 Pain in left hip: Secondary | ICD-10-CM | POA: Diagnosis not present

## 2017-11-17 DIAGNOSIS — M6259 Muscle wasting and atrophy, not elsewhere classified, multiple sites: Secondary | ICD-10-CM | POA: Diagnosis not present

## 2017-11-20 DIAGNOSIS — R2689 Other abnormalities of gait and mobility: Secondary | ICD-10-CM | POA: Diagnosis not present

## 2017-11-20 DIAGNOSIS — M25652 Stiffness of left hip, not elsewhere classified: Secondary | ICD-10-CM | POA: Diagnosis not present

## 2017-11-20 DIAGNOSIS — M25552 Pain in left hip: Secondary | ICD-10-CM | POA: Diagnosis not present

## 2017-11-20 DIAGNOSIS — M25651 Stiffness of right hip, not elsewhere classified: Secondary | ICD-10-CM | POA: Diagnosis not present

## 2017-11-20 DIAGNOSIS — R278 Other lack of coordination: Secondary | ICD-10-CM | POA: Diagnosis not present

## 2017-11-20 DIAGNOSIS — M25551 Pain in right hip: Secondary | ICD-10-CM | POA: Diagnosis not present

## 2017-11-20 DIAGNOSIS — E1342 Other specified diabetes mellitus with diabetic polyneuropathy: Secondary | ICD-10-CM | POA: Diagnosis not present

## 2017-11-20 DIAGNOSIS — Z9181 History of falling: Secondary | ICD-10-CM | POA: Diagnosis not present

## 2017-11-20 DIAGNOSIS — M6259 Muscle wasting and atrophy, not elsewhere classified, multiple sites: Secondary | ICD-10-CM | POA: Diagnosis not present

## 2017-11-23 DIAGNOSIS — M25552 Pain in left hip: Secondary | ICD-10-CM | POA: Diagnosis not present

## 2017-11-23 DIAGNOSIS — R278 Other lack of coordination: Secondary | ICD-10-CM | POA: Diagnosis not present

## 2017-11-23 DIAGNOSIS — M25652 Stiffness of left hip, not elsewhere classified: Secondary | ICD-10-CM | POA: Diagnosis not present

## 2017-11-23 DIAGNOSIS — M25651 Stiffness of right hip, not elsewhere classified: Secondary | ICD-10-CM | POA: Diagnosis not present

## 2017-11-23 DIAGNOSIS — Z9181 History of falling: Secondary | ICD-10-CM | POA: Diagnosis not present

## 2017-11-23 DIAGNOSIS — R2689 Other abnormalities of gait and mobility: Secondary | ICD-10-CM | POA: Diagnosis not present

## 2017-11-23 DIAGNOSIS — M6259 Muscle wasting and atrophy, not elsewhere classified, multiple sites: Secondary | ICD-10-CM | POA: Diagnosis not present

## 2017-11-23 DIAGNOSIS — M25551 Pain in right hip: Secondary | ICD-10-CM | POA: Diagnosis not present

## 2017-11-23 DIAGNOSIS — E1342 Other specified diabetes mellitus with diabetic polyneuropathy: Secondary | ICD-10-CM | POA: Diagnosis not present

## 2017-11-26 DIAGNOSIS — M25651 Stiffness of right hip, not elsewhere classified: Secondary | ICD-10-CM | POA: Diagnosis not present

## 2017-11-26 DIAGNOSIS — M6259 Muscle wasting and atrophy, not elsewhere classified, multiple sites: Secondary | ICD-10-CM | POA: Diagnosis not present

## 2017-11-26 DIAGNOSIS — M25552 Pain in left hip: Secondary | ICD-10-CM | POA: Diagnosis not present

## 2017-11-26 DIAGNOSIS — M25652 Stiffness of left hip, not elsewhere classified: Secondary | ICD-10-CM | POA: Diagnosis not present

## 2017-11-26 DIAGNOSIS — E1342 Other specified diabetes mellitus with diabetic polyneuropathy: Secondary | ICD-10-CM | POA: Diagnosis not present

## 2017-11-26 DIAGNOSIS — R278 Other lack of coordination: Secondary | ICD-10-CM | POA: Diagnosis not present

## 2017-11-26 DIAGNOSIS — R2689 Other abnormalities of gait and mobility: Secondary | ICD-10-CM | POA: Diagnosis not present

## 2017-11-26 DIAGNOSIS — Z9181 History of falling: Secondary | ICD-10-CM | POA: Diagnosis not present

## 2017-11-26 DIAGNOSIS — M25551 Pain in right hip: Secondary | ICD-10-CM | POA: Diagnosis not present

## 2017-12-01 DIAGNOSIS — E113293 Type 2 diabetes mellitus with mild nonproliferative diabetic retinopathy without macular edema, bilateral: Secondary | ICD-10-CM | POA: Diagnosis not present

## 2017-12-01 DIAGNOSIS — H35033 Hypertensive retinopathy, bilateral: Secondary | ICD-10-CM | POA: Diagnosis not present

## 2017-12-01 DIAGNOSIS — Z961 Presence of intraocular lens: Secondary | ICD-10-CM | POA: Diagnosis not present

## 2017-12-01 DIAGNOSIS — Z794 Long term (current) use of insulin: Secondary | ICD-10-CM | POA: Diagnosis not present

## 2017-12-01 DIAGNOSIS — H2513 Age-related nuclear cataract, bilateral: Secondary | ICD-10-CM

## 2017-12-01 HISTORY — DX: Age-related nuclear cataract, bilateral: H25.13

## 2017-12-03 DIAGNOSIS — R2689 Other abnormalities of gait and mobility: Secondary | ICD-10-CM | POA: Diagnosis not present

## 2017-12-03 DIAGNOSIS — M25652 Stiffness of left hip, not elsewhere classified: Secondary | ICD-10-CM | POA: Diagnosis not present

## 2017-12-03 DIAGNOSIS — M6259 Muscle wasting and atrophy, not elsewhere classified, multiple sites: Secondary | ICD-10-CM | POA: Diagnosis not present

## 2017-12-03 DIAGNOSIS — M25651 Stiffness of right hip, not elsewhere classified: Secondary | ICD-10-CM | POA: Diagnosis not present

## 2017-12-03 DIAGNOSIS — R278 Other lack of coordination: Secondary | ICD-10-CM | POA: Diagnosis not present

## 2017-12-03 DIAGNOSIS — E1342 Other specified diabetes mellitus with diabetic polyneuropathy: Secondary | ICD-10-CM | POA: Diagnosis not present

## 2017-12-03 DIAGNOSIS — Z9181 History of falling: Secondary | ICD-10-CM | POA: Diagnosis not present

## 2017-12-03 DIAGNOSIS — M25552 Pain in left hip: Secondary | ICD-10-CM | POA: Diagnosis not present

## 2017-12-03 DIAGNOSIS — M25551 Pain in right hip: Secondary | ICD-10-CM | POA: Diagnosis not present

## 2017-12-23 DIAGNOSIS — E1140 Type 2 diabetes mellitus with diabetic neuropathy, unspecified: Secondary | ICD-10-CM | POA: Diagnosis not present

## 2017-12-23 DIAGNOSIS — F325 Major depressive disorder, single episode, in full remission: Secondary | ICD-10-CM | POA: Diagnosis not present

## 2017-12-23 DIAGNOSIS — I10 Essential (primary) hypertension: Secondary | ICD-10-CM | POA: Diagnosis not present

## 2017-12-23 DIAGNOSIS — N401 Enlarged prostate with lower urinary tract symptoms: Secondary | ICD-10-CM | POA: Diagnosis not present

## 2017-12-23 DIAGNOSIS — E1129 Type 2 diabetes mellitus with other diabetic kidney complication: Secondary | ICD-10-CM | POA: Diagnosis not present

## 2017-12-23 DIAGNOSIS — I2510 Atherosclerotic heart disease of native coronary artery without angina pectoris: Secondary | ICD-10-CM | POA: Diagnosis not present

## 2017-12-23 DIAGNOSIS — Z7984 Long term (current) use of oral hypoglycemic drugs: Secondary | ICD-10-CM | POA: Diagnosis not present

## 2018-01-04 DIAGNOSIS — M25552 Pain in left hip: Secondary | ICD-10-CM | POA: Diagnosis not present

## 2018-01-04 DIAGNOSIS — I10 Essential (primary) hypertension: Secondary | ICD-10-CM | POA: Diagnosis not present

## 2018-01-04 DIAGNOSIS — J309 Allergic rhinitis, unspecified: Secondary | ICD-10-CM | POA: Diagnosis not present

## 2018-01-25 DIAGNOSIS — N401 Enlarged prostate with lower urinary tract symptoms: Secondary | ICD-10-CM | POA: Diagnosis not present

## 2018-01-25 DIAGNOSIS — F325 Major depressive disorder, single episode, in full remission: Secondary | ICD-10-CM | POA: Diagnosis not present

## 2018-01-25 DIAGNOSIS — I10 Essential (primary) hypertension: Secondary | ICD-10-CM | POA: Diagnosis not present

## 2018-01-25 DIAGNOSIS — Z794 Long term (current) use of insulin: Secondary | ICD-10-CM | POA: Diagnosis not present

## 2018-01-25 DIAGNOSIS — I2510 Atherosclerotic heart disease of native coronary artery without angina pectoris: Secondary | ICD-10-CM | POA: Diagnosis not present

## 2018-01-25 DIAGNOSIS — E1129 Type 2 diabetes mellitus with other diabetic kidney complication: Secondary | ICD-10-CM | POA: Diagnosis not present

## 2018-01-26 ENCOUNTER — Ambulatory Visit (INDEPENDENT_AMBULATORY_CARE_PROVIDER_SITE_OTHER): Payer: PPO | Admitting: Neurology

## 2018-01-26 ENCOUNTER — Encounter: Payer: Self-pay | Admitting: Neurology

## 2018-01-26 ENCOUNTER — Other Ambulatory Visit: Payer: Self-pay

## 2018-01-26 VITALS — BP 136/68 | HR 76 | Ht 74.0 in | Wt 202.0 lb

## 2018-01-26 DIAGNOSIS — R2681 Unsteadiness on feet: Secondary | ICD-10-CM

## 2018-01-26 DIAGNOSIS — E1142 Type 2 diabetes mellitus with diabetic polyneuropathy: Secondary | ICD-10-CM

## 2018-01-26 DIAGNOSIS — Z794 Long term (current) use of insulin: Secondary | ICD-10-CM

## 2018-01-26 NOTE — Patient Instructions (Signed)
I think the balance problem is due to the diabetic neuropathy and possibly hip arthritis.  Continue exercises for balance Follow up as needed.

## 2018-01-26 NOTE — Progress Notes (Signed)
NEUROLOGY FOLLOW UP OFFICE NOTE  [name] [id number]  HISTORY OF PRESENT ILLNESS: [name] is an 82 year old right-handed male with hypertension, mild cognitive impairment, CAD status post MI, hyperlipidemia, sick sinus syndrome, and type 2 diabetes mellitus who follows up for worsening gait.  He is accompanied by his wife who supplements history.  UPDATE: He has been participating in physical therapy.  He noticed improvement in his balance.   HISTORY: He and his wife began noticing problems with balance in late 2018, which has gradually gotten worse.  When walking, he feels off-balance.  He feels that he is taking shorter steps and dragging his heels, right foot worse than left foot.  He fell one time while standing at the commode to urinate, he lost his balance and fell forward.  He says his legs feel weak.  While he has some neuropathic pain in the feet, he denies sensation of numbness or difficulty feeling the ground when standing.  He has history of low back pain with lumbar radiculopathy, but currently denies low back or radicular pain in the legs.  He does report bilateral hip pain and has arthritic changes in the hips.  He denies change in bowel or bladder function, although he does wake up during the night to urinate.  He denies neck pain or symptoms in the upper extremities.  He denies difficulty with vision.  He denies freezing, tremor or difficulty swallowing.  He has some short term memory problems but is independent in regards to all simple and complex activities of daily living.     He has uncontrolled type 2 diabetes.  He reports that his last Hgb A1c from 2 months ago was 7.7.  A B12 level recently checked was reportedly borderline and he is now taking over the counter supplements.   Family history is notable as his brother has dementia with Lewy Body disease and his mother had history of memory difficulty.   Carotid doppler from 09/15/17 demonstrated no  hemodynamically significant ICA stenosis with antegrade flow in both vertebral arteries.   Old MRI of lumbar spine from 12/04/09 was personally reviewed and demonstrated right L4-L5 synovial cyst arising from the facet joint, compressing the right L5 nerve root.  PAST MEDICAL HISTORY: Past Medical History:  Diagnosis Date  . CAD (coronary artery disease)    Bare meta stent RCA  . Carotid stenosis    a. Carotid US 3/17: bilateral ICA 1-39% >> repeat 2 years  . Diabetes mellitus ([place])   . History of nuclear stress test    Myoview 12/16: EF 66%, very mild inferior ischemia; Low Risk  . HTN (hypertension)   . Hyperlipidemia   . Peripheral neuropathy     MEDICATIONS: Current Outpatient Medications on File Prior to Visit  Medication Sig Dispense Refill  . allopurinol (ZYLOPRIM) 300 MG tablet Take 300 mg by mouth daily.     Marland Kitchen amLODipine (NORVASC) 10 MG tablet TAKE 1 TABLET ONCE DAILY. 30 tablet 11  . aspirin 81 MG tablet Take 1 tablet (81 mg total) by mouth daily. 30 tablet 6  . BENICAR 20 MG tablet Take 20 mg by mouth daily.     . calcium-vitamin D (OSCAL WITH D) 500-200 MG-UNIT per tablet Take 1 tablet by mouth daily. Vitamin D 3  daily    . citalopram (CELEXA) 40 MG tablet Take 40 mg by mouth daily.     . Coenzyme Q10 (CO Q 10 PO) Take by mouth. 1 tab  daily    . CRESTOR 20 MG tablet Take 10 mg by mouth daily.     . cyanocobalamin (,VITAMIN B-12,) 1000 MCG/ML injection Inject 1,000 mcg into the muscle every 30 (thirty) days.     . diazepam (VALIUM) 5 MG tablet Take 5 mg by mouth daily as needed for anxiety.     . diphenoxylate-atropine (LOMOTIL) 2.5-0.025 MG per tablet Take 1 tablet by mouth 4 (four) times daily as needed for diarrhea or loose stools.    . hydrochlorothiazide (HYDRODIURIL) 25 MG tablet TAKE 1 TABLET ONCE DAILY. 90 tablet 3  . LANTUS SOLOSTAR 100 UNIT/ML injection Inject 54 Units into the skin every morning.     . metFORMIN (GLUCOPHAGE-XR) 500 MG 24 hr tablet Take 500 mg  by mouth daily.    . nabumetone (RELAFEN) 500 MG tablet Take 500 mg by mouth 2 (two) times daily as needed for mild pain.     . nitroGLYCERIN (NITROSTAT) 0.4 MG SL tablet PLACE 1 TABLET UNDER THE TONGUE EVERY 5 MINUTES AS NEEDED FOR CHEST PAIN. 25 tablet 5  . sitaGLIPtin (JANUVIA) 50 MG tablet Take 50 mg by mouth daily.    . vitamin B-12 (CYANOCOBALAMIN) 500 MCG tablet Take 500 mcg by mouth daily.    Marland Kitchen zolpidem (AMBIEN) 10 MG tablet Take 5 mg by mouth at bedtime. For sleep     No current facility-administered medications on file prior to visit.     ALLERGIES: Allergies  Allergen Reactions  . Heparin Hives  . Isosorbide Mononitrate [Isosorbide Dinitrate Er] Hives  . Penicillins Hives  . Sulfonamide Derivatives Hives    FAMILY HISTORY: Family History  Problem Relation Age of Onset  . Heart failure Mother   . Stroke Mother   . Heart attack Father 13    SOCIAL HISTORY: Social History   Socioeconomic History  . Marital status: Married    Spouse name: Webb Silversmith  . Number of children: 3  . Years of education: Not on file  . Highest education level: Bachelor's degree (e.g., BA, AB, BS)  Occupational History  . Occupation: Retired-CPA. Former CEO [employer]  . Financial resource strain: Not on file  . Food insecurity:    Worry: Not on file    Inability: Not on file  . Transportation needs:    Medical: Not on file    Non-medical: Not on file  Tobacco Use  . Smoking status: Former Smoker    Packs/day: 0.50    Years: 10.00    Pack years: 5.00    Types: Cigarettes    Last attempt to quit: 02/05/1972    Years since quitting: 46.0  . Smokeless tobacco: Never Used  Substance and Sexual Activity  . Alcohol use: Yes    Alcohol/week: 4.2 oz    Types: 7 Standard drinks or equivalent per week  . Drug use: No  . Sexual activity: Not on file  Lifestyle  . Physical activity:    Days per week: Not on file    Minutes per session: Not on file  . Stress: Not on file    Relationships  . Social connections:    Talks on phone: Not on file    Gets together: Not on file    Attends religious service: Not on file    Active member of club or organization: Not on file    Attends meetings of clubs or organizations: Not on file    Relationship status: Not on file  . Intimate partner  violence:    Fear of current or ex partner: Not on file    Emotionally abused: Not on file    Physically abused: Not on file    Forced sexual activity: Not on file  Other Topics Concern  . Not on file  Social History Narrative   Married, lives with wife in a one story home. [facility])  Pt is right handed. He drinks hot tea 5x week.    REVIEW OF SYSTEMS: Constitutional: No fevers, chills, or sweats, no generalized fatigue, change in appetite Eyes: No visual changes, double vision, eye pain Ear, nose and throat: No hearing loss, ear pain, nasal congestion, sore throat Cardiovascular: No chest pain, palpitations Respiratory:  No shortness of breath at rest or with exertion, wheezes GastrointestinaI: No nausea, vomiting, diarrhea, abdominal pain, fecal incontinence Genitourinary:  No dysuria, urinary retention or frequency Musculoskeletal:  No neck pain, back pain Integumentary: No rash, pruritus, skin lesions Neurological: as above Psychiatric: No depression, insomnia, anxiety Endocrine: No palpitations, fatigue, diaphoresis, mood swings, change in appetite, change in weight, increased thirst Hematologic/Lymphatic:  No purpura, petechiae. Allergic/Immunologic: no itchy/runny eyes, nasal congestion, recent allergic reactions, rashes  PHYSICAL EXAM: Vitals:   01/26/18 1420  BP: 136/68  Pulse: 76  SpO2: 94%   General: No acute distress.  Patient appears well-groomed.  normal body habitus. Head:  Normocephalic/atraumatic Eyes:  Fundi examined but not visualized Neck: supple, no paraspinal tenderness, full range of motion Heart:  Regular rate and rhythm Lungs:  Clear  to auscultation bilaterally Back: No paraspinal tenderness Neurological Exam: alert and oriented to person, place, and time. Attention span and concentration intact, recent and remote memory intact, fund of knowledge intact.  Speech fluent and not dysarthric, language intact.  CN II-XII intact. Bulk and tone normal, muscle strength 5/5 throughout.  Sensation to pinprick intact.  Vibration sensation reduced in feet.  Deep tendon reflexes trace throughout, toes downgoing.  Finger to nose  testing intact.  Gait with mild limp. Romberg with sway.  IMPRESSION: Unsteady gait, most likely secondary to diabetic polyneuropathy.  He has some arthritis in the hip, which may be playing a role (particularly the right hip).  PLAN: Continue exercises provided by the physical therapist for balance and gait Optimize glycemic control Follow up as needed.  20 minutes spent face to face with patient, over 50% spent discussing diagnosis and management.  [name], DO  CC:  Dr. Felipa Eth

## 2018-02-08 DIAGNOSIS — E113292 Type 2 diabetes mellitus with mild nonproliferative diabetic retinopathy without macular edema, left eye: Secondary | ICD-10-CM | POA: Diagnosis not present

## 2018-02-08 DIAGNOSIS — H52203 Unspecified astigmatism, bilateral: Secondary | ICD-10-CM | POA: Diagnosis not present

## 2018-02-08 DIAGNOSIS — Z961 Presence of intraocular lens: Secondary | ICD-10-CM | POA: Diagnosis not present

## 2018-02-08 DIAGNOSIS — H40013 Open angle with borderline findings, low risk, bilateral: Secondary | ICD-10-CM | POA: Diagnosis not present

## 2018-03-09 DIAGNOSIS — N401 Enlarged prostate with lower urinary tract symptoms: Secondary | ICD-10-CM | POA: Diagnosis not present

## 2018-03-09 DIAGNOSIS — I10 Essential (primary) hypertension: Secondary | ICD-10-CM | POA: Diagnosis not present

## 2018-03-09 DIAGNOSIS — Z794 Long term (current) use of insulin: Secondary | ICD-10-CM | POA: Diagnosis not present

## 2018-03-09 DIAGNOSIS — E1121 Type 2 diabetes mellitus with diabetic nephropathy: Secondary | ICD-10-CM | POA: Diagnosis not present

## 2018-03-09 DIAGNOSIS — E1129 Type 2 diabetes mellitus with other diabetic kidney complication: Secondary | ICD-10-CM | POA: Diagnosis not present

## 2018-03-09 DIAGNOSIS — F325 Major depressive disorder, single episode, in full remission: Secondary | ICD-10-CM | POA: Diagnosis not present

## 2018-03-09 DIAGNOSIS — E1140 Type 2 diabetes mellitus with diabetic neuropathy, unspecified: Secondary | ICD-10-CM | POA: Diagnosis not present

## 2018-04-16 ENCOUNTER — Other Ambulatory Visit: Payer: Self-pay | Admitting: Geriatric Medicine

## 2018-04-16 DIAGNOSIS — N401 Enlarged prostate with lower urinary tract symptoms: Secondary | ICD-10-CM | POA: Diagnosis not present

## 2018-04-16 DIAGNOSIS — I10 Essential (primary) hypertension: Secondary | ICD-10-CM | POA: Diagnosis not present

## 2018-04-16 DIAGNOSIS — F325 Major depressive disorder, single episode, in full remission: Secondary | ICD-10-CM | POA: Diagnosis not present

## 2018-04-16 DIAGNOSIS — Z0000 Encounter for general adult medical examination without abnormal findings: Secondary | ICD-10-CM | POA: Diagnosis not present

## 2018-04-16 DIAGNOSIS — Z1389 Encounter for screening for other disorder: Secondary | ICD-10-CM | POA: Diagnosis not present

## 2018-04-16 DIAGNOSIS — Z7984 Long term (current) use of oral hypoglycemic drugs: Secondary | ICD-10-CM | POA: Diagnosis not present

## 2018-04-16 DIAGNOSIS — E1140 Type 2 diabetes mellitus with diabetic neuropathy, unspecified: Secondary | ICD-10-CM | POA: Diagnosis not present

## 2018-04-16 DIAGNOSIS — E1121 Type 2 diabetes mellitus with diabetic nephropathy: Secondary | ICD-10-CM | POA: Diagnosis not present

## 2018-04-16 DIAGNOSIS — Z23 Encounter for immunization: Secondary | ICD-10-CM | POA: Diagnosis not present

## 2018-04-16 DIAGNOSIS — G4733 Obstructive sleep apnea (adult) (pediatric): Secondary | ICD-10-CM | POA: Diagnosis not present

## 2018-04-16 DIAGNOSIS — E11319 Type 2 diabetes mellitus with unspecified diabetic retinopathy without macular edema: Secondary | ICD-10-CM | POA: Diagnosis not present

## 2018-04-16 DIAGNOSIS — D696 Thrombocytopenia, unspecified: Secondary | ICD-10-CM | POA: Diagnosis not present

## 2018-04-16 DIAGNOSIS — E7800 Pure hypercholesterolemia, unspecified: Secondary | ICD-10-CM | POA: Diagnosis not present

## 2018-04-16 DIAGNOSIS — M48062 Spinal stenosis, lumbar region with neurogenic claudication: Secondary | ICD-10-CM | POA: Diagnosis not present

## 2018-04-16 DIAGNOSIS — R29898 Other symptoms and signs involving the musculoskeletal system: Secondary | ICD-10-CM

## 2018-04-16 DIAGNOSIS — Z79899 Other long term (current) drug therapy: Secondary | ICD-10-CM | POA: Diagnosis not present

## 2018-04-29 ENCOUNTER — Ambulatory Visit
Admission: RE | Admit: 2018-04-29 | Discharge: 2018-04-29 | Disposition: A | Discharge status: Home / Self Care | Payer: PPO | Source: Ambulatory Visit | Attending: Geriatric Medicine | Admitting: Geriatric Medicine

## 2018-04-29 DIAGNOSIS — M48061 Spinal stenosis, lumbar region without neurogenic claudication: Secondary | ICD-10-CM | POA: Diagnosis not present

## 2018-04-29 DIAGNOSIS — R29898 Other symptoms and signs involving the musculoskeletal system: Secondary | ICD-10-CM

## 2018-05-04 DIAGNOSIS — N401 Enlarged prostate with lower urinary tract symptoms: Secondary | ICD-10-CM | POA: Diagnosis not present

## 2018-05-04 DIAGNOSIS — E1121 Type 2 diabetes mellitus with diabetic nephropathy: Secondary | ICD-10-CM | POA: Diagnosis not present

## 2018-05-04 DIAGNOSIS — E1129 Type 2 diabetes mellitus with other diabetic kidney complication: Secondary | ICD-10-CM | POA: Diagnosis not present

## 2018-05-04 DIAGNOSIS — E1140 Type 2 diabetes mellitus with diabetic neuropathy, unspecified: Secondary | ICD-10-CM | POA: Diagnosis not present

## 2018-05-04 DIAGNOSIS — F325 Major depressive disorder, single episode, in full remission: Secondary | ICD-10-CM | POA: Diagnosis not present

## 2018-05-04 DIAGNOSIS — I10 Essential (primary) hypertension: Secondary | ICD-10-CM | POA: Diagnosis not present

## 2018-05-12 DIAGNOSIS — K219 Gastro-esophageal reflux disease without esophagitis: Secondary | ICD-10-CM | POA: Diagnosis not present

## 2018-05-12 DIAGNOSIS — J3089 Other allergic rhinitis: Secondary | ICD-10-CM | POA: Diagnosis not present

## 2018-05-27 DIAGNOSIS — E1121 Type 2 diabetes mellitus with diabetic nephropathy: Secondary | ICD-10-CM | POA: Diagnosis not present

## 2018-05-27 DIAGNOSIS — E1129 Type 2 diabetes mellitus with other diabetic kidney complication: Secondary | ICD-10-CM | POA: Diagnosis not present

## 2018-05-27 DIAGNOSIS — I10 Essential (primary) hypertension: Secondary | ICD-10-CM | POA: Diagnosis not present

## 2018-05-27 DIAGNOSIS — N401 Enlarged prostate with lower urinary tract symptoms: Secondary | ICD-10-CM | POA: Diagnosis not present

## 2018-05-27 DIAGNOSIS — F325 Major depressive disorder, single episode, in full remission: Secondary | ICD-10-CM | POA: Diagnosis not present

## 2018-05-27 DIAGNOSIS — E1140 Type 2 diabetes mellitus with diabetic neuropathy, unspecified: Secondary | ICD-10-CM | POA: Diagnosis not present

## 2018-07-05 DIAGNOSIS — E1129 Type 2 diabetes mellitus with other diabetic kidney complication: Secondary | ICD-10-CM | POA: Diagnosis not present

## 2018-07-05 DIAGNOSIS — Z794 Long term (current) use of insulin: Secondary | ICD-10-CM | POA: Diagnosis not present

## 2018-07-05 DIAGNOSIS — I10 Essential (primary) hypertension: Secondary | ICD-10-CM | POA: Diagnosis not present

## 2018-07-05 DIAGNOSIS — Z7984 Long term (current) use of oral hypoglycemic drugs: Secondary | ICD-10-CM | POA: Diagnosis not present

## 2018-07-05 DIAGNOSIS — E1121 Type 2 diabetes mellitus with diabetic nephropathy: Secondary | ICD-10-CM | POA: Diagnosis not present

## 2018-07-05 DIAGNOSIS — F325 Major depressive disorder, single episode, in full remission: Secondary | ICD-10-CM | POA: Diagnosis not present

## 2018-07-05 DIAGNOSIS — N401 Enlarged prostate with lower urinary tract symptoms: Secondary | ICD-10-CM | POA: Diagnosis not present

## 2018-07-19 DIAGNOSIS — E1129 Type 2 diabetes mellitus with other diabetic kidney complication: Secondary | ICD-10-CM | POA: Diagnosis not present

## 2018-07-19 DIAGNOSIS — I10 Essential (primary) hypertension: Secondary | ICD-10-CM | POA: Diagnosis not present

## 2018-07-19 DIAGNOSIS — E1121 Type 2 diabetes mellitus with diabetic nephropathy: Secondary | ICD-10-CM | POA: Diagnosis not present

## 2018-07-19 DIAGNOSIS — E113299 Type 2 diabetes mellitus with mild nonproliferative diabetic retinopathy without macular edema, unspecified eye: Secondary | ICD-10-CM | POA: Diagnosis not present

## 2018-07-19 DIAGNOSIS — G4733 Obstructive sleep apnea (adult) (pediatric): Secondary | ICD-10-CM | POA: Diagnosis not present

## 2018-07-19 DIAGNOSIS — E1140 Type 2 diabetes mellitus with diabetic neuropathy, unspecified: Secondary | ICD-10-CM | POA: Diagnosis not present

## 2018-07-26 DIAGNOSIS — F325 Major depressive disorder, single episode, in full remission: Secondary | ICD-10-CM | POA: Diagnosis not present

## 2018-07-26 DIAGNOSIS — I10 Essential (primary) hypertension: Secondary | ICD-10-CM | POA: Diagnosis not present

## 2018-07-26 DIAGNOSIS — E1129 Type 2 diabetes mellitus with other diabetic kidney complication: Secondary | ICD-10-CM | POA: Diagnosis not present

## 2018-07-26 DIAGNOSIS — E1121 Type 2 diabetes mellitus with diabetic nephropathy: Secondary | ICD-10-CM | POA: Diagnosis not present

## 2018-07-26 DIAGNOSIS — N401 Enlarged prostate with lower urinary tract symptoms: Secondary | ICD-10-CM | POA: Diagnosis not present

## 2018-07-26 DIAGNOSIS — E1140 Type 2 diabetes mellitus with diabetic neuropathy, unspecified: Secondary | ICD-10-CM | POA: Diagnosis not present

## 2018-08-09 ENCOUNTER — Ambulatory Visit: Payer: PPO | Admitting: Cardiovascular Disease

## 2018-08-09 NOTE — Progress Notes (Deleted)
No chief complaint on file.    History of Present Illness: 83 yo male with history of CAD, HTN and hyperlipidemia here today for cardiac follow up. He underwent stenting of the right coronary artery in March 2009 with a bare metal stent. Nuclear stress test August 2013 showed no large areas of ischemia.  Echo with preserved LV function and no significant valvular issues. Since he had T wave inversions that were worrisome on the stress test, cardiac cath was arranged August 2013 and showed stable disease with patent stent RCA. I saw him 04/05/13 and he had c/o dizziness and muscle weakness. His statin was held. Carotid artery dopplers March 2017 with mild bilateral ICA stenosis. Stress myoview 07/03/15 low risk with no large areas of ischemia. He tried Imdur but it made him itch.   He is here today for follow up. The patient denies any chest pain, dyspnea, palpitations, lower extremity edema, orthopnea, PND, dizziness, near syncope or syncope.    Primary Care Physician: Lajean Manes, MD  Past Medical History:  Diagnosis Date  . CAD (coronary artery disease)    Bare meta stent RCA  . Carotid stenosis    a. Carotid US 3/17: bilateral ICA 1-39% >> repeat 2 years  . Diabetes mellitus ([place])   . History of nuclear stress test    Myoview 12/16: EF 66%, very mild inferior ischemia; Low Risk  . HTN (hypertension)   . Hyperlipidemia   . Peripheral neuropathy     Past Surgical History:  Procedure Laterality Date  . LEFT HEART CATHETERIZATION WITH CORONARY ANGIOGRAM N/A 03/04/2012   Procedure: LEFT HEART CATHETERIZATION WITH CORONARY ANGIOGRAM;  Surgeon: Burnell Blanks, MD;  Location: [facility] CATH LAB;  Service: Cardiovascular;  Laterality: N/A;  . TONSILLECTOMY      Current Outpatient Medications  Medication Sig Dispense Refill  . allopurinol (ZYLOPRIM) 300 MG tablet Take 300 mg by mouth daily.     Marland Kitchen amLODipine (NORVASC) 10 MG tablet TAKE 1 TABLET ONCE DAILY. 30 tablet 11  . aspirin 81  MG tablet Take 1 tablet (81 mg total) by mouth daily. 30 tablet 6  . BENICAR 20 MG tablet Take 20 mg by mouth daily.     . calcium-vitamin D (OSCAL WITH D) 500-200 MG-UNIT per tablet Take 1 tablet by mouth daily. Vitamin D 3  daily    . citalopram (CELEXA) 40 MG tablet Take 40 mg by mouth daily.     . Coenzyme Q10 (CO Q 10 PO) Take by mouth. 1 tab daily    . CRESTOR 20 MG tablet Take 10 mg by mouth daily.     . cyanocobalamin (,VITAMIN B-12,) 1000 MCG/ML injection Inject 1,000 mcg into the muscle every 30 (thirty) days.     . diazepam (VALIUM) 5 MG tablet Take 5 mg by mouth daily as needed for anxiety.     . diphenoxylate-atropine (LOMOTIL) 2.5-0.025 MG per tablet Take 1 tablet by mouth 4 (four) times daily as needed for diarrhea or loose stools.    . hydrochlorothiazide (HYDRODIURIL) 25 MG tablet TAKE 1 TABLET ONCE DAILY. 90 tablet 3  . irbesartan (AVAPRO) 150 MG tablet     . LANTUS SOLOSTAR 100 UNIT/ML injection Inject 54 Units into the skin every morning.     . metFORMIN (GLUCOPHAGE-XR) 500 MG 24 hr tablet Take 500 mg by mouth daily.    . nabumetone (RELAFEN) 500 MG tablet Take 500 mg by mouth 2 (two) times daily as needed for mild pain.     Marland Kitchen  nitroGLYCERIN (NITROSTAT) 0.4 MG SL tablet PLACE 1 TABLET UNDER THE TONGUE EVERY 5 MINUTES AS NEEDED FOR CHEST PAIN. 25 tablet 5  . sitaGLIPtin (JANUVIA) 50 MG tablet Take 50 mg by mouth daily.    . vitamin B-12 (CYANOCOBALAMIN) 500 MCG tablet Take 500 mcg by mouth daily.    Marland Kitchen zolpidem (AMBIEN) 10 MG tablet Take 5 mg by mouth at bedtime. For sleep     No current facility-administered medications for this visit.     Allergies  Allergen Reactions  . Heparin Hives  . Isosorbide Mononitrate [Isosorbide Dinitrate Er] Hives  . Penicillins Hives  . Sulfonamide Derivatives Hives    Social History   Socioeconomic History  . Marital status: Married    Spouse name: Webb Silversmith  . Number of children: 3  . Years of education: Not on file  . Highest  education level: Bachelor's degree (e.g., BA, AB, BS)  Occupational History  . Occupation: Retired-CPA. Former CEO [employer]  . Financial resource strain: Not on file  . Food insecurity:    Worry: Not on file    Inability: Not on file  . Transportation needs:    Medical: Not on file    Non-medical: Not on file  Tobacco Use  . Smoking status: Former Smoker    Packs/day: 0.50    Years: 10.00    Pack years: 5.00    Types: Cigarettes    Last attempt to quit: 02/05/1972    Years since quitting: 46.5  . Smokeless tobacco: Never Used  Substance and Sexual Activity  . Alcohol use: Yes    Alcohol/week: 7.0 standard drinks    Types: 7 Standard drinks or equivalent per week  . Drug use: No  . Sexual activity: Not on file  Lifestyle  . Physical activity:    Days per week: Not on file    Minutes per session: Not on file  . Stress: Not on file  Relationships  . Social connections:    Talks on phone: Not on file    Gets together: Not on file    Attends religious service: Not on file    Active member of club or organization: Not on file    Attends meetings of clubs or organizations: Not on file    Relationship status: Not on file  . Intimate partner violence:    Fear of current or ex partner: Not on file    Emotionally abused: Not on file    Physically abused: Not on file    Forced sexual activity: Not on file  Other Topics Concern  . Not on file  Social History Narrative   Married, lives with wife in a one story home. [facility])  Pt is right handed. He drinks hot tea 5x week.    Family History  Problem Relation Age of Onset  . Heart failure Mother   . Stroke Mother   . Heart attack Father 80    Review of Systems:  As stated in the HPI and otherwise negative.   There were no vitals taken for this visit.  Physical Examination: General: Well developed, well nourished, NAD  HEENT: OP clear, mucus membranes moist  SKIN: warm, dry. No rashes. Neuro:  No focal deficits  Musculoskeletal: Muscle strength 5/5 all ext  Psychiatric: Mood and affect normal  Neck: No JVD, no carotid bruits, no thyromegaly, no lymphadenopathy.  Lungs:Clear bilaterally, no wheezes, rhonci, crackles Cardiovascular: Regular rate and rhythm. No murmurs, gallops or rubs. Abdomen:Soft.  Bowel sounds present. Non-tender.  Extremities: No lower extremity edema. Pulses are 2 + in the bilateral DP/PT.  Cardiac cath; 03/04/12:  Left main: No disease noted.  Left Anterior Descending Artery: Large caliber vessel that courses to the apex. There is calcification in the proximal and mid vessel. The proximal vessel has a 50% eccentric stenosis. The mid vessel has a long, smooth 50% stenosis.  Ramus Intermediate: Moderate sized vessel with mild plaque disease.  Circumflex Artery: Large caliber vessel with mild plaque. The first OM branch is very small. The second OM branch is small with a 30% ostial stenosis.  Right Coronary Artery: Large, dominant vessel with 40% proximal stenosis. The mid vessel has a patent stent with minimal in-stent restenosis. The distal vessel has a 30% stenosis. The PDA and PLA are patent mild diffuse plaque disease.  Left Ventricular Angiogram: LVEF 55%.   Carotid artery dopplers 04/06/13: 0-39% bilateral stenosis.   EKG:  EKG is *** ordered today. The ekg ordered today demonstrates   Recent Labs: No results found for requested labs within last 8760 hours.   Lipid Panel   Wt Readings from Last 3 Encounters:  01/26/18 202 lb (91.6 kg)  10/26/17 205 lb (93 kg)  07/20/17 206 lb 6.4 oz (93.6 kg)     Other studies Reviewed: Additional studies/ records that were reviewed today include: . Review of the above records demonstrates:    Assessment and Plan:   1. CAD without angina: No chest pain. Continue ASA and statin.       2. HTN: BP is controlled. No changes  3. Carotid artery disease: Mild bilateral carotid disease by dopplers March 2019.     Current medicines are reviewed at length with the patient today.  The patient does not have concerns regarding medicines.  The following changes have been made:  no change  Labs/ tests ordered today include:   No orders of the defined types were placed in this encounter.   Disposition:   FU with me in 12  months  Signed, Lauree Chandler, MD 08/09/2018 6:07 AM    [facility] [place], [place], [place]  [address] Phone: [phone]; Fax: [phone]

## 2018-08-19 ENCOUNTER — Ambulatory Visit: Payer: PPO | Admitting: Cardiovascular Disease

## 2018-08-19 ENCOUNTER — Encounter: Payer: Self-pay | Admitting: Cardiovascular Disease

## 2018-08-19 VITALS — BP 150/86 | HR 67 | Ht 74.0 in | Wt 209.0 lb

## 2018-08-19 DIAGNOSIS — I6523 Occlusion and stenosis of bilateral carotid arteries: Secondary | ICD-10-CM

## 2018-08-19 DIAGNOSIS — I2510 Atherosclerotic heart disease of native coronary artery without angina pectoris: Secondary | ICD-10-CM

## 2018-08-19 DIAGNOSIS — I10 Essential (primary) hypertension: Secondary | ICD-10-CM | POA: Diagnosis not present

## 2018-08-19 NOTE — Patient Instructions (Signed)
Medication Instructions:  Your physician recommends that you continue on your current medications as directed. Please refer to the Current Medication list given to you today.  If you need a refill on your cardiac medications before your next appointment, please call your pharmacy.   Lab work: none If you have labs (blood work) drawn today and your tests are completely normal, you will receive your results only by: . MyChart Message (if you have MyChart) OR . A paper copy in the mail If you have any lab test that is abnormal or we need to change your treatment, we will call you to review the results.  Testing/Procedures: none  Follow-Up: At [facility], you and your health needs are our priority.  As part of our continuing mission to provide you with exceptional heart care, we have created designated Provider Care Teams.  These Care Teams include your primary Cardiologist (physician) and Advanced Practice Providers (APPs -  Physician Assistants and Nurse Practitioners) who all work together to provide you with the care you need, when you need it. You will need a follow up appointment in 12 months.  Please call our office 4 months in advance to schedule this appointment.  You may see Christopher McAlhany, MD or one of the following Advanced Practice Providers on your designated Care Team:   Brittainy Simmons, PA-C Dayna Dunn, PA-C . Michele Lenze, PA-C  Any Other Special Instructions Will Be Listed Below (If Applicable).    

## 2018-08-19 NOTE — Progress Notes (Signed)
Chief Complaint  Patient presents with  . Follow-up    CAD   History of Present Illness: 83 yo male with history of CAD, HTN and hyperlipidemia here today for cardiac follow up. He underwent stenting of the right coronary artery in March 2009 with a bare metal stent. Nuclear stress test August 2013 showed no large areas of ischemia.  Echo 2013 with preserved LV function and no significant valvular issues. Since he had T wave inversions that were worrisome on the stress test, cardiac cath was arranged August 2013 and showed stable disease with patent stent RCA. I saw him 04/05/13 and he had c/o dizziness and muscle weakness. His statin was held. Carotid artery dopplers March 2019 with mild bilateral ICA stenosis. Stress myoview 07/03/15 low risk with no large areas of ischemia. He tried Imdur but it made him itch.   He is here today for follow up. One episode of chest pain last month that resolved with an antacid. The patient denies any dyspnea, palpitations, lower extremity edema, orthopnea, PND, dizziness, near syncope or syncope.   Primary Care Physician: Lajean Manes, MD  Past Medical History:  Diagnosis Date  . CAD (coronary artery disease)    Bare meta stent RCA  . Carotid stenosis    a. Carotid US 3/17: bilateral ICA 1-39% >> repeat 2 years  . Diabetes mellitus ([place])   . History of nuclear stress test    Myoview 12/16: EF 66%, very mild inferior ischemia; Low Risk  . HTN (hypertension)   . Hyperlipidemia   . Peripheral neuropathy     Past Surgical History:  Procedure Laterality Date  . LEFT HEART CATHETERIZATION WITH CORONARY ANGIOGRAM N/A 03/04/2012   Procedure: LEFT HEART CATHETERIZATION WITH CORONARY ANGIOGRAM;  Surgeon: Burnell Blanks, MD;  Location: [facility] CATH LAB;  Service: Cardiovascular;  Laterality: N/A;  . TONSILLECTOMY      Current Outpatient Medications  Medication Sig Dispense Refill  . allopurinol (ZYLOPRIM) 300 MG tablet Take 300 mg by mouth daily.       Marland Kitchen amLODipine (NORVASC) 10 MG tablet TAKE 1 TABLET ONCE DAILY. 30 tablet 11  . aspirin 81 MG tablet Take 1 tablet (81 mg total) by mouth daily. 30 tablet 6  . BENICAR 20 MG tablet Take 20 mg by mouth daily.     . calcium-vitamin D (OSCAL WITH D) 500-200 MG-UNIT per tablet Take 1 tablet by mouth daily. Vitamin D 3  daily    . citalopram (CELEXA) 40 MG tablet Take 40 mg by mouth daily.     . Coenzyme Q10 (CO Q 10 PO) Take by mouth. 1 tab daily    . CRESTOR 20 MG tablet Take 10 mg by mouth daily.     . diazepam (VALIUM) 5 MG tablet Take 5 mg by mouth daily as needed for anxiety.     . diphenoxylate-atropine (LOMOTIL) 2.5-0.025 MG per tablet Take 1 tablet by mouth 4 (four) times daily as needed for diarrhea or loose stools.    . fluticasone (FLONASE) 50 MCG/ACT nasal spray Place 2 sprays into both nostrils as needed for allergies or rhinitis.    . hydrochlorothiazide (HYDRODIURIL) 25 MG tablet TAKE 1 TABLET ONCE DAILY. 90 tablet 3  . ipratropium-albuterol (DUONEB) 0.5-2.5 (3) MG/3ML SOLN Inhale 3 mLs into the lungs every 4 (four) hours as needed.    . irbesartan (AVAPRO) 150 MG tablet     . LANTUS SOLOSTAR 100 UNIT/ML injection Inject 54 Units into the skin every morning.     Marland Kitchen  metFORMIN (GLUCOPHAGE-XR) 500 MG 24 hr tablet Take 500 mg by mouth daily.    . nabumetone (RELAFEN) 500 MG tablet Take 500 mg by mouth 2 (two) times daily as needed for mild pain.     . nitroGLYCERIN (NITROSTAT) 0.4 MG SL tablet PLACE 1 TABLET UNDER THE TONGUE EVERY 5 MINUTES AS NEEDED FOR CHEST PAIN. 25 tablet 5  . Saline (SIMPLY SALINE) 0.9 % AERS Place into the nose.    . sitaGLIPtin (JANUVIA) 50 MG tablet Take 50 mg by mouth daily.    . vitamin B-12 (CYANOCOBALAMIN) 500 MCG tablet Take 500 mcg by mouth daily.    Marland Kitchen zolpidem (AMBIEN) 10 MG tablet Take 5 mg by mouth at bedtime. For sleep     No current facility-administered medications for this visit.     Allergies  Allergen Reactions  . Ace Inhibitors   . Alpha  Lipoic Acid [Lipoic Acid]     Upset Stomach  . Invokana [Canagliflozin]   . Heparin Hives  . Isosorbide Mononitrate [Isosorbide Dinitrate Er] Hives  . Penicillins Hives  . Sulfonamide Derivatives Hives    Social History   Socioeconomic History  . Marital status: Married    Spouse name: Webb Silversmith  . Number of children: 3  . Years of education: Not on file  . Highest education level: Bachelor's degree (e.g., BA, AB, BS)  Occupational History  . 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 Financial resource strain: Not on file  . Food insecurity:    Worry: Not on file    Inability: Not on file  . Transportation needs:    Medical: Not on file    Non-medical: Not on file  Tobacco Use  . Smoking status: Former Smoker    Packs/day: 0.50    Years: 10.00    Pack years: 5.00    Types: Cigarettes    Last attempt to quit: 02/05/1972    Years since quitting: 46.5  . Smokeless tobacco: Never Used  Substance and Sexual Activity  . Alcohol use: Yes    Alcohol/week: 7.0 standard drinks    Types: 7 Standard drinks or equivalent per week  . Drug use: No  . Sexual activity: Not on file  Lifestyle  . Physical activity:    Days per week: Not on file    Minutes per session: Not on file  . Stress: Not on file  Relationships  . Social connections:    Talks on phone: Not on file    Gets together: Not on file    Attends religious service: Not on file    Active member of club or organization: Not on file    Attends meetings of clubs or organizations: Not on file    Relationship status: Not on file  . Intimate partner violence:    Fear of current or ex partner: Not on file    Emotionally abused: Not on file    Physically abused: Not on file    Forced sexual activity: Not on file  Other Topics Concern  . Not on file  Social History Narrative   Married, lives with wife in a one story home. [facility])  Pt is right handed. He drinks hot tea 5x week.    Family  History  Problem Relation Age of Onset  . Heart failure Mother   . Stroke Mother   . Heart attack Father 37    Review of Systems:  As stated in the HPI and otherwise negative.  BP (!) 150/86   Pulse 67   Ht 6\' 2"  (1.88 m)   Wt 209 lb (94.8 kg)   SpO2 97%   BMI 26.83 kg/m   Physical Examination:  General: Well developed, well nourished, NAD  HEENT: OP clear, mucus membranes moist  SKIN: warm, dry. No rashes. Neuro: No focal deficits  Musculoskeletal: Muscle strength 5/5 all ext  Psychiatric: Mood and affect normal  Neck: No JVD, no carotid bruits, no thyromegaly, no lymphadenopathy.  Lungs:Clear bilaterally, no wheezes, rhonci, crackles Cardiovascular: Regular rate and rhythm. No murmurs, gallops or rubs. Abdomen:Soft. Bowel sounds present. Non-tender.  Extremities: No lower extremity edema. Pulses are 2 + in the bilateral DP/PT.  Cardiac cath; 03/04/12:  Left main: No disease noted.  Left Anterior Descending Artery: Large caliber vessel that courses to the apex. There is calcification in the proximal and mid vessel. The proximal vessel has a 50% eccentric stenosis. The mid vessel has a long, smooth 50% stenosis.  Ramus Intermediate: Moderate sized vessel with mild plaque disease.  Circumflex Artery: Large caliber vessel with mild plaque. The first OM branch is very small. The second OM branch is small with a 30% ostial stenosis.  Right Coronary Artery: Large, dominant vessel with 40% proximal stenosis. The mid vessel has a patent stent with minimal in-stent restenosis. The distal vessel has a 30% stenosis. The PDA and PLA are patent mild diffuse plaque disease.  Left Ventricular Angiogram: LVEF 55%.   Carotid artery dopplers 04/06/13: 0-39% bilateral stenosis.   EKG:  EKG is ordered today. The ekg ordered today demonstrates Sinus, 1st degree AV block. Poor R wave progression. No change from EKG in 2019  Recent Labs: No results found for requested labs within last 8760  hours.   Lipid Panel   Wt Readings from Last 3 Encounters:  08/19/18 209 lb (94.8 kg)  01/26/18 202 lb (91.6 kg)  10/26/17 205 lb (93 kg)     Other studies Reviewed: Additional studies/ records that were reviewed today include: . Review of the above records demonstrates:    Assessment and Plan:   1. CAD without angina: No chest pain. Last cath in August 2013 with moderate CAD. Stress myoview December 2016 was low risk. Continue ASA and statin.     2. HTN: BP is elevated today but he rushed into the office and could not find a parking space. He reports good BP control at home and at primary care visits. No changes. He will follow his BP closely at home for a few weeks.   3. Carotid artery disease: Mild bilateral carotid disease by dopplers March 2019.   Current medicines are reviewed at length with the patient today.  The patient does not have concerns regarding medicines.  The following changes have been made:  no change  Labs/ tests ordered today include:   Orders Placed This Encounter  Procedures  . EKG 12-Lead    Disposition:   FU with me in 12  months  Signed, Lauree Chandler, MD 08/19/2018 3:10 PM    [facility] [place], [place], [place]  [address] Phone: [phone]; Fax: [phone]

## 2018-08-31 DIAGNOSIS — E1129 Type 2 diabetes mellitus with other diabetic kidney complication: Secondary | ICD-10-CM | POA: Diagnosis not present

## 2018-08-31 DIAGNOSIS — E1140 Type 2 diabetes mellitus with diabetic neuropathy, unspecified: Secondary | ICD-10-CM | POA: Diagnosis not present

## 2018-08-31 DIAGNOSIS — F325 Major depressive disorder, single episode, in full remission: Secondary | ICD-10-CM | POA: Diagnosis not present

## 2018-08-31 DIAGNOSIS — N401 Enlarged prostate with lower urinary tract symptoms: Secondary | ICD-10-CM | POA: Diagnosis not present

## 2018-08-31 DIAGNOSIS — E1121 Type 2 diabetes mellitus with diabetic nephropathy: Secondary | ICD-10-CM | POA: Diagnosis not present

## 2018-08-31 DIAGNOSIS — I10 Essential (primary) hypertension: Secondary | ICD-10-CM | POA: Diagnosis not present

## 2018-09-16 DIAGNOSIS — E1129 Type 2 diabetes mellitus with other diabetic kidney complication: Secondary | ICD-10-CM | POA: Diagnosis not present

## 2018-09-16 DIAGNOSIS — N401 Enlarged prostate with lower urinary tract symptoms: Secondary | ICD-10-CM | POA: Diagnosis not present

## 2018-09-16 DIAGNOSIS — E1121 Type 2 diabetes mellitus with diabetic nephropathy: Secondary | ICD-10-CM | POA: Diagnosis not present

## 2018-09-16 DIAGNOSIS — I10 Essential (primary) hypertension: Secondary | ICD-10-CM | POA: Diagnosis not present

## 2018-09-16 DIAGNOSIS — E1140 Type 2 diabetes mellitus with diabetic neuropathy, unspecified: Secondary | ICD-10-CM | POA: Diagnosis not present

## 2018-09-16 DIAGNOSIS — F325 Major depressive disorder, single episode, in full remission: Secondary | ICD-10-CM | POA: Diagnosis not present

## 2018-09-30 IMAGING — DX DG LUMBAR SPINE 2-3V
3 series · 3 of 3 positions shown · non-contrast
Comparison: 12/25/2009

CLINICAL DATA: Bilateral low back pain for 1 month, bilateral
buttock pain, question injury lifting heavy box

EXAM:
LUMBAR SPINE - 2-3 VIEW

[dg lumbar spine 2-3 views (1 of 3)]
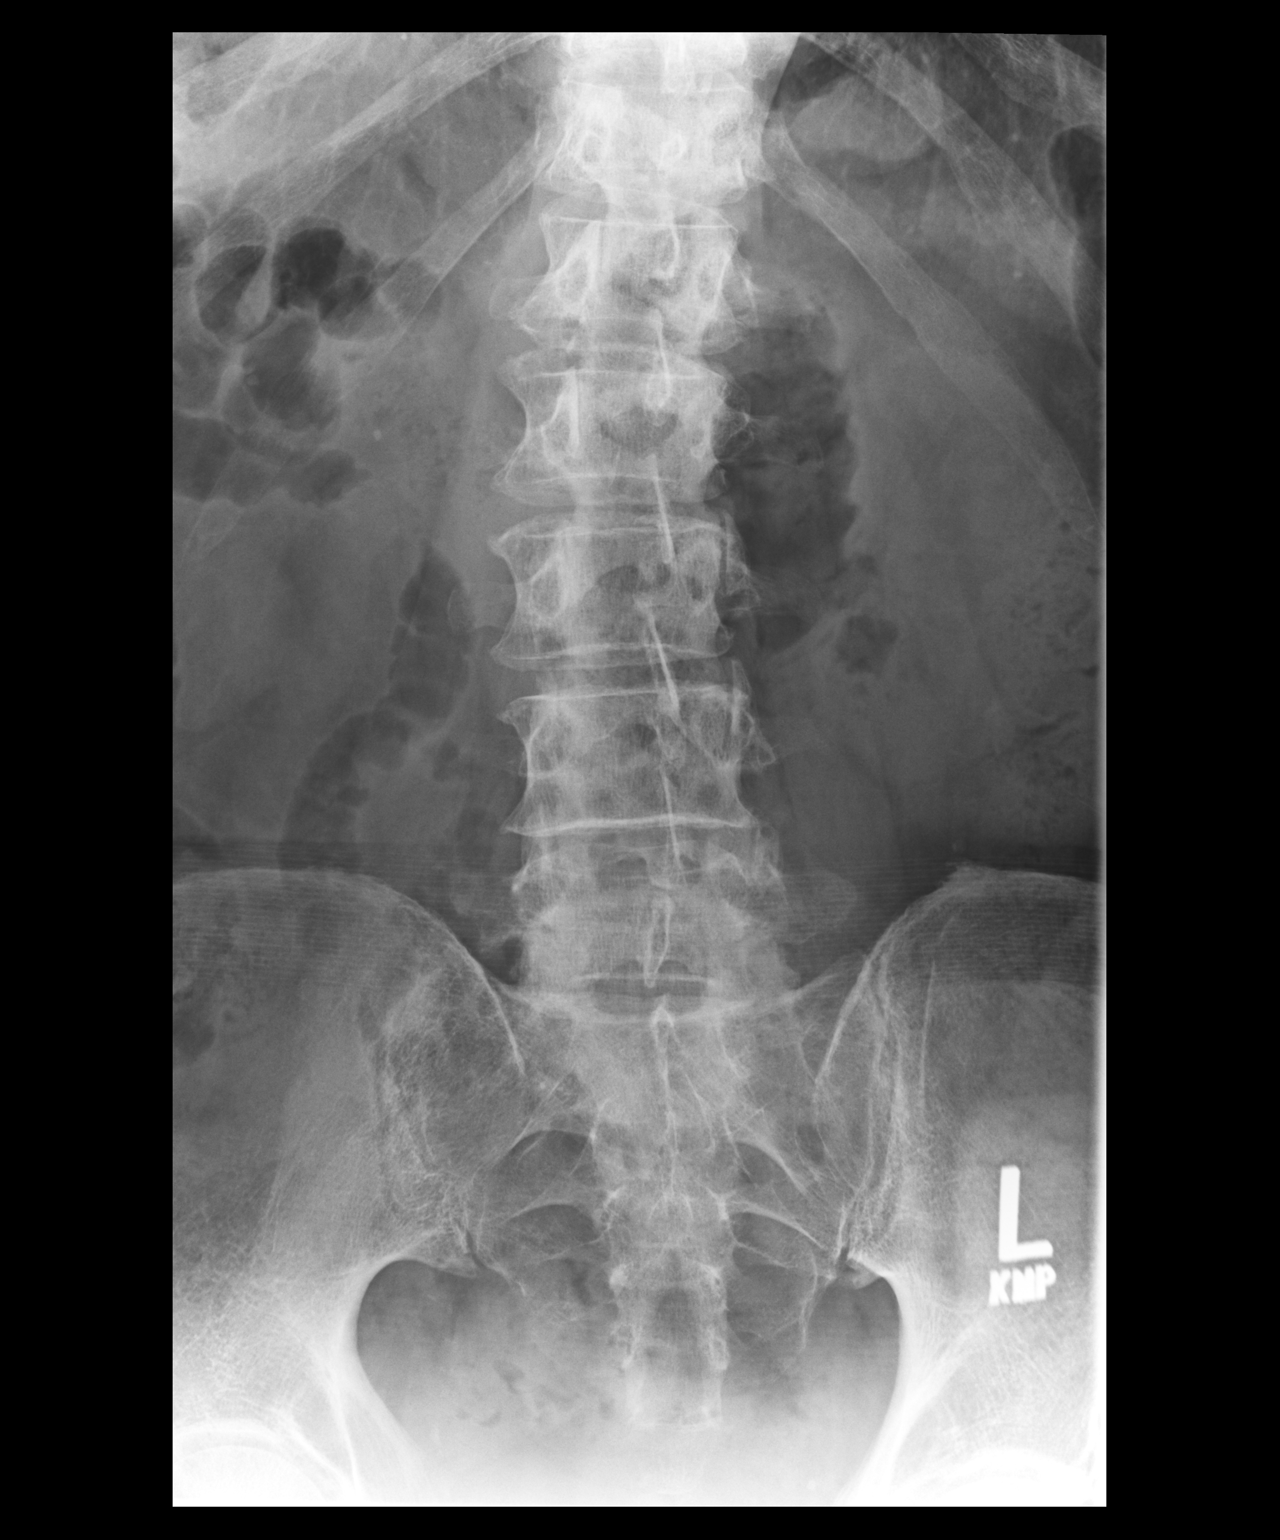

[dg lumbar spine 2-3 views (2 of 3)]
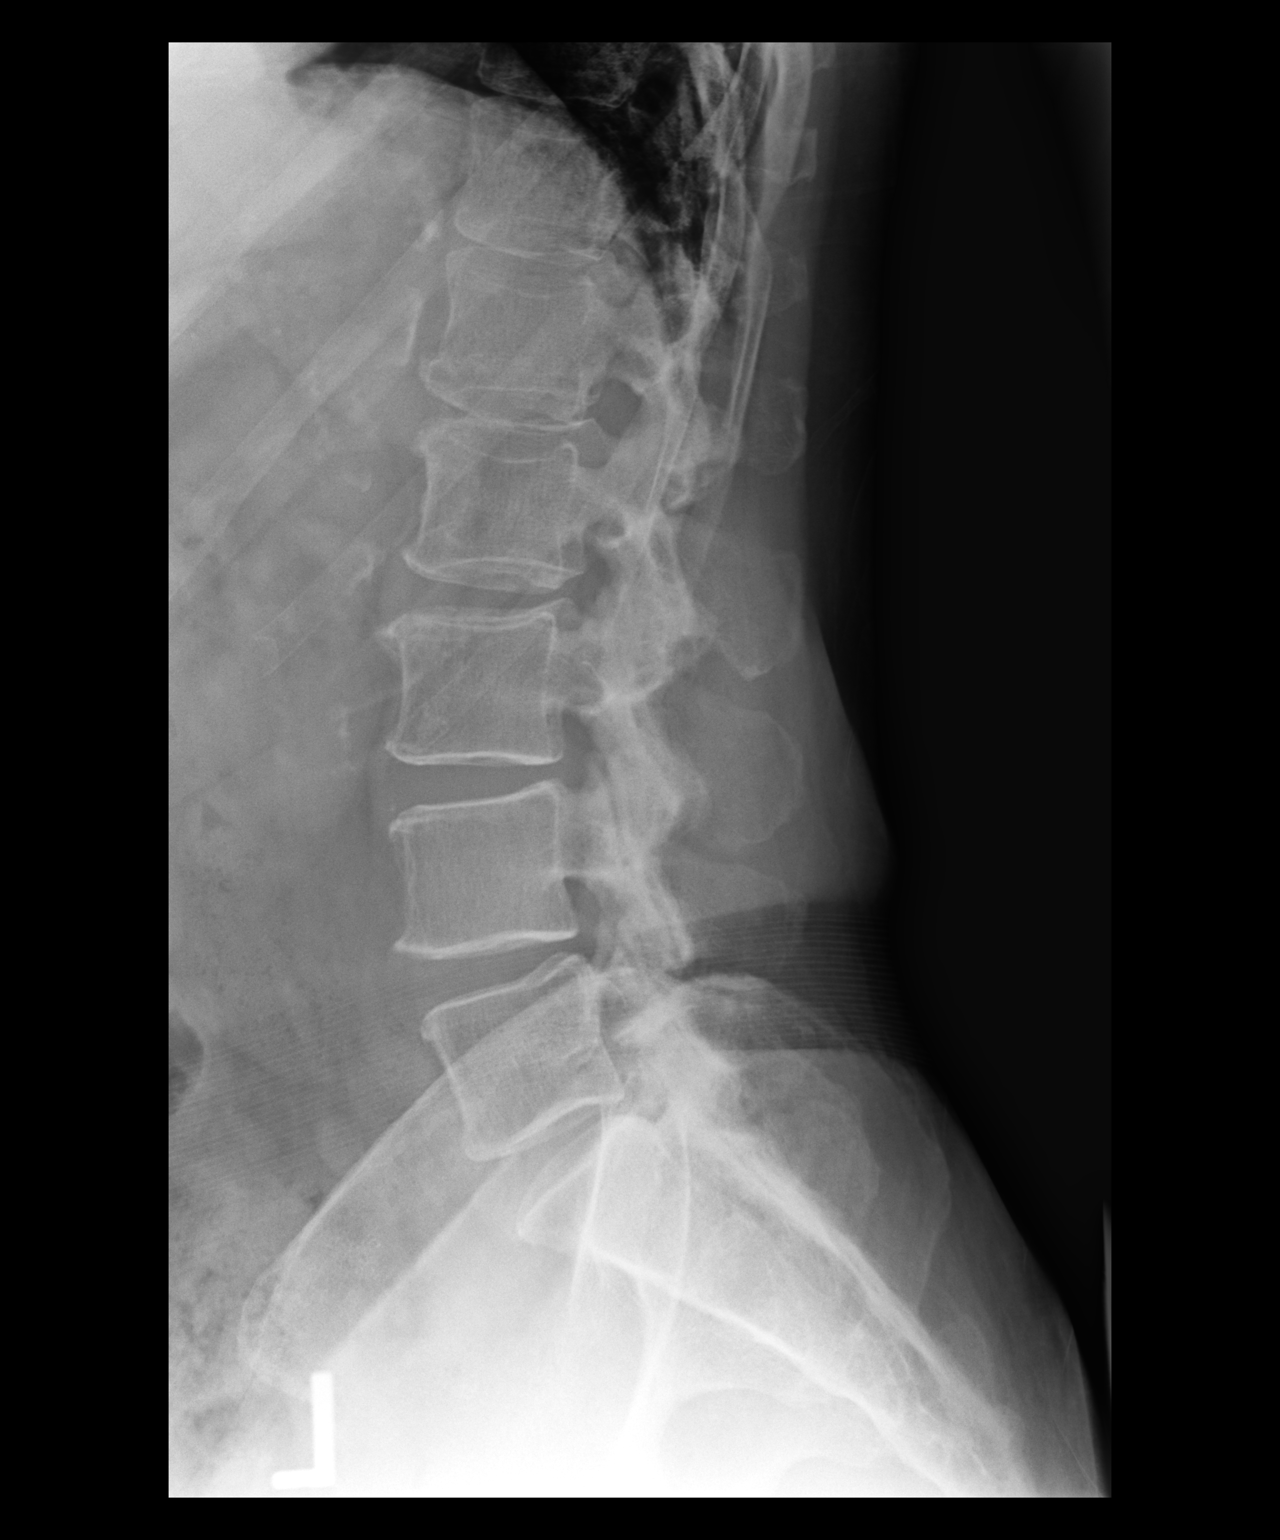

[dg lumbar spine 2-3 views (3 of 3)]
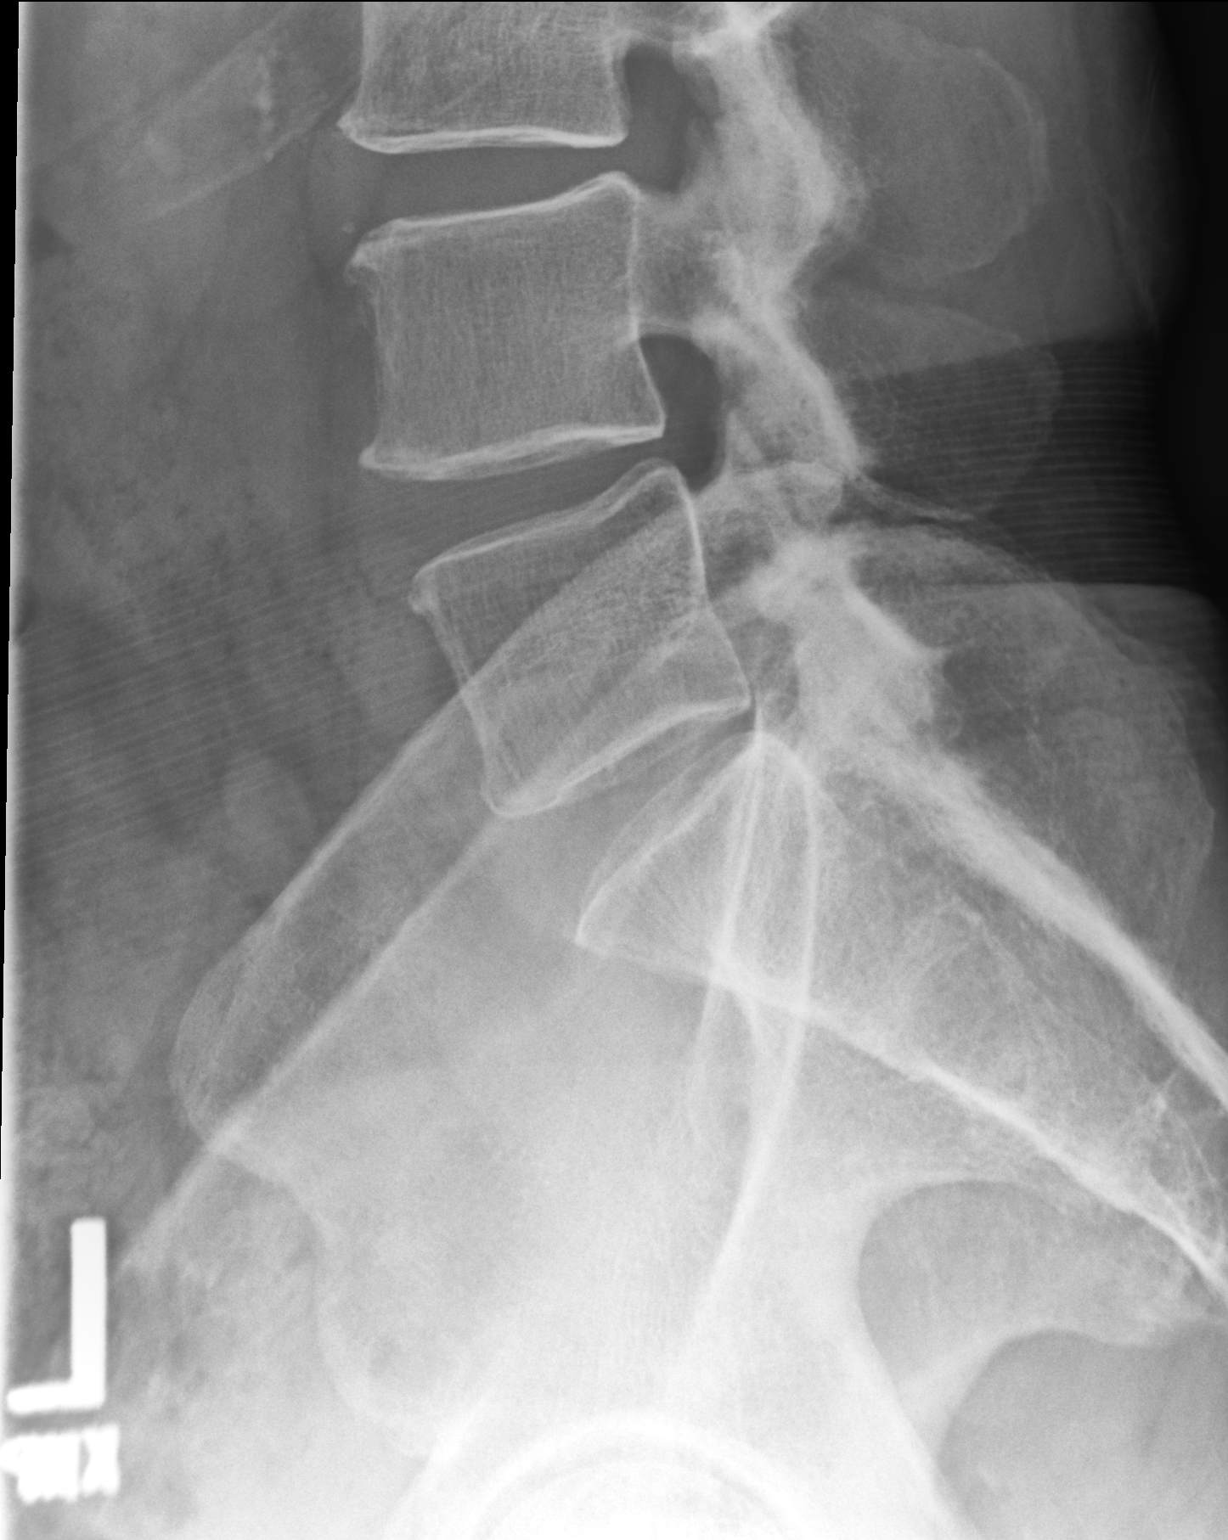

[3 of 3 positions shown; findings below may reference images not displayed]

FINDINGS: Three views of the lumbar spine submitted. No acute fracture or
subluxation. Mild anterior spurring noted upper and lower endplate
of L2 L3 and L4 vertebral body. Mild facet degenerative changes L4
and L5 level. Mild disc space flattening with anterior spurring at
L1-L2 level. Alignment and vertebral body heights are preserved.
Mild lumbar dextroscoliosis.
IMPRESSION: No acute fracture or subluxation. Mild degenerative changes as
described above. Mild dextroscoliosis.

## 2018-10-07 DIAGNOSIS — I10 Essential (primary) hypertension: Secondary | ICD-10-CM | POA: Diagnosis not present

## 2018-10-07 DIAGNOSIS — E113299 Type 2 diabetes mellitus with mild nonproliferative diabetic retinopathy without macular edema, unspecified eye: Secondary | ICD-10-CM | POA: Diagnosis not present

## 2018-10-07 DIAGNOSIS — E1121 Type 2 diabetes mellitus with diabetic nephropathy: Secondary | ICD-10-CM | POA: Diagnosis not present

## 2018-10-07 DIAGNOSIS — E1129 Type 2 diabetes mellitus with other diabetic kidney complication: Secondary | ICD-10-CM | POA: Diagnosis not present

## 2018-10-07 DIAGNOSIS — E1140 Type 2 diabetes mellitus with diabetic neuropathy, unspecified: Secondary | ICD-10-CM | POA: Diagnosis not present

## 2018-10-07 DIAGNOSIS — L400 Psoriasis vulgaris: Secondary | ICD-10-CM | POA: Diagnosis not present

## 2018-10-20 DIAGNOSIS — F325 Major depressive disorder, single episode, in full remission: Secondary | ICD-10-CM | POA: Diagnosis not present

## 2018-10-20 DIAGNOSIS — E1129 Type 2 diabetes mellitus with other diabetic kidney complication: Secondary | ICD-10-CM | POA: Diagnosis not present

## 2018-10-20 DIAGNOSIS — E7800 Pure hypercholesterolemia, unspecified: Secondary | ICD-10-CM | POA: Diagnosis not present

## 2018-10-20 DIAGNOSIS — E1121 Type 2 diabetes mellitus with diabetic nephropathy: Secondary | ICD-10-CM | POA: Diagnosis not present

## 2018-10-20 DIAGNOSIS — I10 Essential (primary) hypertension: Secondary | ICD-10-CM | POA: Diagnosis not present

## 2018-10-20 DIAGNOSIS — N401 Enlarged prostate with lower urinary tract symptoms: Secondary | ICD-10-CM | POA: Diagnosis not present

## 2018-10-20 DIAGNOSIS — E1140 Type 2 diabetes mellitus with diabetic neuropathy, unspecified: Secondary | ICD-10-CM | POA: Diagnosis not present

## 2018-12-02 DIAGNOSIS — E1140 Type 2 diabetes mellitus with diabetic neuropathy, unspecified: Secondary | ICD-10-CM | POA: Diagnosis not present

## 2018-12-02 DIAGNOSIS — E1129 Type 2 diabetes mellitus with other diabetic kidney complication: Secondary | ICD-10-CM | POA: Diagnosis not present

## 2018-12-02 DIAGNOSIS — N401 Enlarged prostate with lower urinary tract symptoms: Secondary | ICD-10-CM | POA: Diagnosis not present

## 2018-12-02 DIAGNOSIS — I10 Essential (primary) hypertension: Secondary | ICD-10-CM | POA: Diagnosis not present

## 2018-12-02 DIAGNOSIS — E7800 Pure hypercholesterolemia, unspecified: Secondary | ICD-10-CM | POA: Diagnosis not present

## 2018-12-02 DIAGNOSIS — E1121 Type 2 diabetes mellitus with diabetic nephropathy: Secondary | ICD-10-CM | POA: Diagnosis not present

## 2018-12-02 DIAGNOSIS — F325 Major depressive disorder, single episode, in full remission: Secondary | ICD-10-CM | POA: Diagnosis not present

## 2018-12-06 ENCOUNTER — Encounter

## 2018-12-24 DIAGNOSIS — I10 Essential (primary) hypertension: Secondary | ICD-10-CM | POA: Diagnosis not present

## 2018-12-24 DIAGNOSIS — F325 Major depressive disorder, single episode, in full remission: Secondary | ICD-10-CM | POA: Diagnosis not present

## 2018-12-24 DIAGNOSIS — E1121 Type 2 diabetes mellitus with diabetic nephropathy: Secondary | ICD-10-CM | POA: Diagnosis not present

## 2018-12-24 DIAGNOSIS — E7800 Pure hypercholesterolemia, unspecified: Secondary | ICD-10-CM | POA: Diagnosis not present

## 2018-12-24 DIAGNOSIS — E1140 Type 2 diabetes mellitus with diabetic neuropathy, unspecified: Secondary | ICD-10-CM | POA: Diagnosis not present

## 2018-12-24 DIAGNOSIS — E1129 Type 2 diabetes mellitus with other diabetic kidney complication: Secondary | ICD-10-CM | POA: Diagnosis not present

## 2018-12-24 DIAGNOSIS — N401 Enlarged prostate with lower urinary tract symptoms: Secondary | ICD-10-CM | POA: Diagnosis not present

## 2019-01-03 DIAGNOSIS — J3089 Other allergic rhinitis: Secondary | ICD-10-CM | POA: Diagnosis not present

## 2019-01-03 DIAGNOSIS — K219 Gastro-esophageal reflux disease without esophagitis: Secondary | ICD-10-CM | POA: Diagnosis not present

## 2019-01-21 DIAGNOSIS — E1121 Type 2 diabetes mellitus with diabetic nephropathy: Secondary | ICD-10-CM | POA: Diagnosis not present

## 2019-01-21 DIAGNOSIS — F325 Major depressive disorder, single episode, in full remission: Secondary | ICD-10-CM | POA: Diagnosis not present

## 2019-01-21 DIAGNOSIS — I10 Essential (primary) hypertension: Secondary | ICD-10-CM | POA: Diagnosis not present

## 2019-01-21 DIAGNOSIS — Z794 Long term (current) use of insulin: Secondary | ICD-10-CM | POA: Diagnosis not present

## 2019-01-21 DIAGNOSIS — E1140 Type 2 diabetes mellitus with diabetic neuropathy, unspecified: Secondary | ICD-10-CM | POA: Diagnosis not present

## 2019-01-21 DIAGNOSIS — N401 Enlarged prostate with lower urinary tract symptoms: Secondary | ICD-10-CM | POA: Diagnosis not present

## 2019-01-21 DIAGNOSIS — E1129 Type 2 diabetes mellitus with other diabetic kidney complication: Secondary | ICD-10-CM | POA: Diagnosis not present

## 2019-01-21 DIAGNOSIS — E7800 Pure hypercholesterolemia, unspecified: Secondary | ICD-10-CM | POA: Diagnosis not present

## 2019-02-16 DIAGNOSIS — Z961 Presence of intraocular lens: Secondary | ICD-10-CM | POA: Diagnosis not present

## 2019-02-16 DIAGNOSIS — E113293 Type 2 diabetes mellitus with mild nonproliferative diabetic retinopathy without macular edema, bilateral: Secondary | ICD-10-CM | POA: Diagnosis not present

## 2019-02-16 DIAGNOSIS — H52203 Unspecified astigmatism, bilateral: Secondary | ICD-10-CM | POA: Diagnosis not present

## 2019-02-16 DIAGNOSIS — H40023 Open angle with borderline findings, high risk, bilateral: Secondary | ICD-10-CM | POA: Diagnosis not present

## 2019-03-01 DIAGNOSIS — N401 Enlarged prostate with lower urinary tract symptoms: Secondary | ICD-10-CM | POA: Diagnosis not present

## 2019-03-01 DIAGNOSIS — I10 Essential (primary) hypertension: Secondary | ICD-10-CM | POA: Diagnosis not present

## 2019-03-01 DIAGNOSIS — E7800 Pure hypercholesterolemia, unspecified: Secondary | ICD-10-CM | POA: Diagnosis not present

## 2019-03-01 DIAGNOSIS — E1140 Type 2 diabetes mellitus with diabetic neuropathy, unspecified: Secondary | ICD-10-CM | POA: Diagnosis not present

## 2019-03-01 DIAGNOSIS — E1129 Type 2 diabetes mellitus with other diabetic kidney complication: Secondary | ICD-10-CM | POA: Diagnosis not present

## 2019-03-01 DIAGNOSIS — E1121 Type 2 diabetes mellitus with diabetic nephropathy: Secondary | ICD-10-CM | POA: Diagnosis not present

## 2019-03-01 DIAGNOSIS — F325 Major depressive disorder, single episode, in full remission: Secondary | ICD-10-CM | POA: Diagnosis not present

## 2019-03-10 DIAGNOSIS — E1129 Type 2 diabetes mellitus with other diabetic kidney complication: Secondary | ICD-10-CM | POA: Diagnosis not present

## 2019-03-10 DIAGNOSIS — I10 Essential (primary) hypertension: Secondary | ICD-10-CM | POA: Diagnosis not present

## 2019-03-10 DIAGNOSIS — E7800 Pure hypercholesterolemia, unspecified: Secondary | ICD-10-CM | POA: Diagnosis not present

## 2019-03-10 DIAGNOSIS — N401 Enlarged prostate with lower urinary tract symptoms: Secondary | ICD-10-CM | POA: Diagnosis not present

## 2019-03-10 DIAGNOSIS — E1121 Type 2 diabetes mellitus with diabetic nephropathy: Secondary | ICD-10-CM | POA: Diagnosis not present

## 2019-03-10 DIAGNOSIS — F325 Major depressive disorder, single episode, in full remission: Secondary | ICD-10-CM | POA: Diagnosis not present

## 2019-03-10 DIAGNOSIS — E1140 Type 2 diabetes mellitus with diabetic neuropathy, unspecified: Secondary | ICD-10-CM | POA: Diagnosis not present

## 2019-04-12 DIAGNOSIS — E113293 Type 2 diabetes mellitus with mild nonproliferative diabetic retinopathy without macular edema, bilateral: Secondary | ICD-10-CM | POA: Diagnosis not present

## 2019-04-12 DIAGNOSIS — Z961 Presence of intraocular lens: Secondary | ICD-10-CM | POA: Diagnosis not present

## 2019-04-12 DIAGNOSIS — Z794 Long term (current) use of insulin: Secondary | ICD-10-CM | POA: Diagnosis not present

## 2019-04-12 DIAGNOSIS — H35033 Hypertensive retinopathy, bilateral: Secondary | ICD-10-CM | POA: Diagnosis not present

## 2019-04-22 DIAGNOSIS — I10 Essential (primary) hypertension: Secondary | ICD-10-CM | POA: Diagnosis not present

## 2019-04-22 DIAGNOSIS — N401 Enlarged prostate with lower urinary tract symptoms: Secondary | ICD-10-CM | POA: Diagnosis not present

## 2019-04-22 DIAGNOSIS — E1121 Type 2 diabetes mellitus with diabetic nephropathy: Secondary | ICD-10-CM | POA: Diagnosis not present

## 2019-04-22 DIAGNOSIS — E7800 Pure hypercholesterolemia, unspecified: Secondary | ICD-10-CM | POA: Diagnosis not present

## 2019-04-22 DIAGNOSIS — F3342 Major depressive disorder, recurrent, in full remission: Secondary | ICD-10-CM | POA: Diagnosis not present

## 2019-04-22 DIAGNOSIS — F325 Major depressive disorder, single episode, in full remission: Secondary | ICD-10-CM | POA: Diagnosis not present

## 2019-04-22 DIAGNOSIS — E1129 Type 2 diabetes mellitus with other diabetic kidney complication: Secondary | ICD-10-CM | POA: Diagnosis not present

## 2019-04-22 DIAGNOSIS — E1140 Type 2 diabetes mellitus with diabetic neuropathy, unspecified: Secondary | ICD-10-CM | POA: Diagnosis not present

## 2019-04-29 DIAGNOSIS — Z1389 Encounter for screening for other disorder: Secondary | ICD-10-CM | POA: Diagnosis not present

## 2019-04-29 DIAGNOSIS — E113299 Type 2 diabetes mellitus with mild nonproliferative diabetic retinopathy without macular edema, unspecified eye: Secondary | ICD-10-CM | POA: Diagnosis not present

## 2019-04-29 DIAGNOSIS — E7800 Pure hypercholesterolemia, unspecified: Secondary | ICD-10-CM | POA: Diagnosis not present

## 2019-04-29 DIAGNOSIS — R269 Unspecified abnormalities of gait and mobility: Secondary | ICD-10-CM | POA: Diagnosis not present

## 2019-04-29 DIAGNOSIS — E1121 Type 2 diabetes mellitus with diabetic nephropathy: Secondary | ICD-10-CM | POA: Diagnosis not present

## 2019-04-29 DIAGNOSIS — F3342 Major depressive disorder, recurrent, in full remission: Secondary | ICD-10-CM | POA: Diagnosis not present

## 2019-04-29 DIAGNOSIS — Z79899 Other long term (current) drug therapy: Secondary | ICD-10-CM | POA: Diagnosis not present

## 2019-04-29 DIAGNOSIS — I10 Essential (primary) hypertension: Secondary | ICD-10-CM | POA: Diagnosis not present

## 2019-04-29 DIAGNOSIS — E1140 Type 2 diabetes mellitus with diabetic neuropathy, unspecified: Secondary | ICD-10-CM | POA: Diagnosis not present

## 2019-04-29 DIAGNOSIS — Z0000 Encounter for general adult medical examination without abnormal findings: Secondary | ICD-10-CM | POA: Diagnosis not present

## 2019-05-18 DIAGNOSIS — I10 Essential (primary) hypertension: Secondary | ICD-10-CM | POA: Diagnosis not present

## 2019-05-18 DIAGNOSIS — E7800 Pure hypercholesterolemia, unspecified: Secondary | ICD-10-CM | POA: Diagnosis not present

## 2019-05-18 DIAGNOSIS — E1129 Type 2 diabetes mellitus with other diabetic kidney complication: Secondary | ICD-10-CM | POA: Diagnosis not present

## 2019-05-18 DIAGNOSIS — N401 Enlarged prostate with lower urinary tract symptoms: Secondary | ICD-10-CM | POA: Diagnosis not present

## 2019-05-18 DIAGNOSIS — E1140 Type 2 diabetes mellitus with diabetic neuropathy, unspecified: Secondary | ICD-10-CM | POA: Diagnosis not present

## 2019-05-18 DIAGNOSIS — F3342 Major depressive disorder, recurrent, in full remission: Secondary | ICD-10-CM | POA: Diagnosis not present

## 2019-05-18 DIAGNOSIS — E1121 Type 2 diabetes mellitus with diabetic nephropathy: Secondary | ICD-10-CM | POA: Diagnosis not present

## 2019-05-18 DIAGNOSIS — F325 Major depressive disorder, single episode, in full remission: Secondary | ICD-10-CM | POA: Diagnosis not present

## 2019-05-25 DIAGNOSIS — R278 Other lack of coordination: Secondary | ICD-10-CM | POA: Diagnosis not present

## 2019-05-25 DIAGNOSIS — M62561 Muscle wasting and atrophy, not elsewhere classified, right lower leg: Secondary | ICD-10-CM | POA: Diagnosis not present

## 2019-05-25 DIAGNOSIS — F3342 Major depressive disorder, recurrent, in full remission: Secondary | ICD-10-CM | POA: Diagnosis not present

## 2019-05-25 DIAGNOSIS — Z9181 History of falling: Secondary | ICD-10-CM | POA: Diagnosis not present

## 2019-05-25 DIAGNOSIS — M62562 Muscle wasting and atrophy, not elsewhere classified, left lower leg: Secondary | ICD-10-CM | POA: Diagnosis not present

## 2019-05-25 DIAGNOSIS — E1142 Type 2 diabetes mellitus with diabetic polyneuropathy: Secondary | ICD-10-CM | POA: Diagnosis not present

## 2019-05-25 DIAGNOSIS — R2689 Other abnormalities of gait and mobility: Secondary | ICD-10-CM | POA: Diagnosis not present

## 2019-06-06 DIAGNOSIS — E1142 Type 2 diabetes mellitus with diabetic polyneuropathy: Secondary | ICD-10-CM | POA: Diagnosis not present

## 2019-06-06 DIAGNOSIS — R278 Other lack of coordination: Secondary | ICD-10-CM | POA: Diagnosis not present

## 2019-06-06 DIAGNOSIS — M62562 Muscle wasting and atrophy, not elsewhere classified, left lower leg: Secondary | ICD-10-CM | POA: Diagnosis not present

## 2019-06-06 DIAGNOSIS — M62561 Muscle wasting and atrophy, not elsewhere classified, right lower leg: Secondary | ICD-10-CM | POA: Diagnosis not present

## 2019-06-06 DIAGNOSIS — R2689 Other abnormalities of gait and mobility: Secondary | ICD-10-CM | POA: Diagnosis not present

## 2019-06-06 DIAGNOSIS — Z9181 History of falling: Secondary | ICD-10-CM | POA: Diagnosis not present

## 2019-06-06 DIAGNOSIS — F3342 Major depressive disorder, recurrent, in full remission: Secondary | ICD-10-CM | POA: Diagnosis not present

## 2019-06-09 DIAGNOSIS — E1142 Type 2 diabetes mellitus with diabetic polyneuropathy: Secondary | ICD-10-CM | POA: Diagnosis not present

## 2019-06-09 DIAGNOSIS — Z9181 History of falling: Secondary | ICD-10-CM | POA: Diagnosis not present

## 2019-06-09 DIAGNOSIS — R278 Other lack of coordination: Secondary | ICD-10-CM | POA: Diagnosis not present

## 2019-06-09 DIAGNOSIS — M62562 Muscle wasting and atrophy, not elsewhere classified, left lower leg: Secondary | ICD-10-CM | POA: Diagnosis not present

## 2019-06-09 DIAGNOSIS — F3342 Major depressive disorder, recurrent, in full remission: Secondary | ICD-10-CM | POA: Diagnosis not present

## 2019-06-09 DIAGNOSIS — R2689 Other abnormalities of gait and mobility: Secondary | ICD-10-CM | POA: Diagnosis not present

## 2019-06-09 DIAGNOSIS — M62561 Muscle wasting and atrophy, not elsewhere classified, right lower leg: Secondary | ICD-10-CM | POA: Diagnosis not present

## 2019-06-17 DIAGNOSIS — E1142 Type 2 diabetes mellitus with diabetic polyneuropathy: Secondary | ICD-10-CM | POA: Diagnosis not present

## 2019-06-17 DIAGNOSIS — R2689 Other abnormalities of gait and mobility: Secondary | ICD-10-CM | POA: Diagnosis not present

## 2019-06-17 DIAGNOSIS — M62562 Muscle wasting and atrophy, not elsewhere classified, left lower leg: Secondary | ICD-10-CM | POA: Diagnosis not present

## 2019-06-17 DIAGNOSIS — F3342 Major depressive disorder, recurrent, in full remission: Secondary | ICD-10-CM | POA: Diagnosis not present

## 2019-06-17 DIAGNOSIS — R278 Other lack of coordination: Secondary | ICD-10-CM | POA: Diagnosis not present

## 2019-06-17 DIAGNOSIS — Z9181 History of falling: Secondary | ICD-10-CM | POA: Diagnosis not present

## 2019-06-17 DIAGNOSIS — M62561 Muscle wasting and atrophy, not elsewhere classified, right lower leg: Secondary | ICD-10-CM | POA: Diagnosis not present

## 2019-07-07 DIAGNOSIS — I10 Essential (primary) hypertension: Secondary | ICD-10-CM | POA: Diagnosis not present

## 2019-07-07 DIAGNOSIS — E7800 Pure hypercholesterolemia, unspecified: Secondary | ICD-10-CM | POA: Diagnosis not present

## 2019-07-07 DIAGNOSIS — E1129 Type 2 diabetes mellitus with other diabetic kidney complication: Secondary | ICD-10-CM | POA: Diagnosis not present

## 2019-07-07 DIAGNOSIS — F3342 Major depressive disorder, recurrent, in full remission: Secondary | ICD-10-CM | POA: Diagnosis not present

## 2019-07-07 DIAGNOSIS — F325 Major depressive disorder, single episode, in full remission: Secondary | ICD-10-CM | POA: Diagnosis not present

## 2019-07-07 DIAGNOSIS — N401 Enlarged prostate with lower urinary tract symptoms: Secondary | ICD-10-CM | POA: Diagnosis not present

## 2019-07-07 DIAGNOSIS — E1121 Type 2 diabetes mellitus with diabetic nephropathy: Secondary | ICD-10-CM | POA: Diagnosis not present

## 2019-07-07 DIAGNOSIS — E1140 Type 2 diabetes mellitus with diabetic neuropathy, unspecified: Secondary | ICD-10-CM | POA: Diagnosis not present

## 2019-07-11 ENCOUNTER — Other Ambulatory Visit: Payer: Self-pay | Admitting: Cardiovascular Disease

## 2019-07-11 MED ORDER — NITROGLYCERIN 0.4 MG SL SUBL: SUBLINGUAL_TABLET | SUBLINGUAL | 5 refills | Status: DC

## 2019-07-11 NOTE — Telephone Encounter (Signed)
*  STAT* If patient is at the pharmacy, call can be transferred to refill team.   1. Which medications need to be refilled? (please list name of each medication and dose if known) nitroGLYCERIN (NITROSTAT) 0.4 MG SL tablet  2. Which pharmacy/location (including street and city if local pharmacy) is medication to be sent to? [facility]  3. Do they need a 30 day or 90 day supply? [place]

## 2019-07-12 DIAGNOSIS — I10 Essential (primary) hypertension: Secondary | ICD-10-CM | POA: Diagnosis not present

## 2019-08-04 DIAGNOSIS — E1129 Type 2 diabetes mellitus with other diabetic kidney complication: Secondary | ICD-10-CM | POA: Diagnosis not present

## 2019-08-04 DIAGNOSIS — I10 Essential (primary) hypertension: Secondary | ICD-10-CM | POA: Diagnosis not present

## 2019-08-04 DIAGNOSIS — N401 Enlarged prostate with lower urinary tract symptoms: Secondary | ICD-10-CM | POA: Diagnosis not present

## 2019-08-04 DIAGNOSIS — F3342 Major depressive disorder, recurrent, in full remission: Secondary | ICD-10-CM | POA: Diagnosis not present

## 2019-08-04 DIAGNOSIS — E1140 Type 2 diabetes mellitus with diabetic neuropathy, unspecified: Secondary | ICD-10-CM | POA: Diagnosis not present

## 2019-08-04 DIAGNOSIS — F325 Major depressive disorder, single episode, in full remission: Secondary | ICD-10-CM | POA: Diagnosis not present

## 2019-08-04 DIAGNOSIS — E1121 Type 2 diabetes mellitus with diabetic nephropathy: Secondary | ICD-10-CM | POA: Diagnosis not present

## 2019-08-04 DIAGNOSIS — E7800 Pure hypercholesterolemia, unspecified: Secondary | ICD-10-CM | POA: Diagnosis not present

## 2019-08-05 DIAGNOSIS — I10 Essential (primary) hypertension: Secondary | ICD-10-CM | POA: Diagnosis not present

## 2019-08-10 DIAGNOSIS — Z79899 Other long term (current) drug therapy: Secondary | ICD-10-CM | POA: Diagnosis not present

## 2019-08-24 DIAGNOSIS — H5340 Unspecified visual field defects: Secondary | ICD-10-CM | POA: Diagnosis not present

## 2019-08-24 DIAGNOSIS — H40013 Open angle with borderline findings, low risk, bilateral: Secondary | ICD-10-CM | POA: Diagnosis not present

## 2019-08-24 DIAGNOSIS — E113293 Type 2 diabetes mellitus with mild nonproliferative diabetic retinopathy without macular edema, bilateral: Secondary | ICD-10-CM | POA: Diagnosis not present

## 2019-08-26 DIAGNOSIS — E7800 Pure hypercholesterolemia, unspecified: Secondary | ICD-10-CM | POA: Diagnosis not present

## 2019-08-26 DIAGNOSIS — M5442 Lumbago with sciatica, left side: Secondary | ICD-10-CM | POA: Diagnosis not present

## 2019-08-26 DIAGNOSIS — G8929 Other chronic pain: Secondary | ICD-10-CM | POA: Diagnosis not present

## 2019-08-26 DIAGNOSIS — I10 Essential (primary) hypertension: Secondary | ICD-10-CM | POA: Diagnosis not present

## 2019-08-26 DIAGNOSIS — M5441 Lumbago with sciatica, right side: Secondary | ICD-10-CM | POA: Diagnosis not present

## 2019-09-02 DIAGNOSIS — E1129 Type 2 diabetes mellitus with other diabetic kidney complication: Secondary | ICD-10-CM | POA: Diagnosis not present

## 2019-09-02 DIAGNOSIS — E1121 Type 2 diabetes mellitus with diabetic nephropathy: Secondary | ICD-10-CM | POA: Diagnosis not present

## 2019-09-02 DIAGNOSIS — E7800 Pure hypercholesterolemia, unspecified: Secondary | ICD-10-CM | POA: Diagnosis not present

## 2019-09-02 DIAGNOSIS — F3342 Major depressive disorder, recurrent, in full remission: Secondary | ICD-10-CM | POA: Diagnosis not present

## 2019-09-02 DIAGNOSIS — N401 Enlarged prostate with lower urinary tract symptoms: Secondary | ICD-10-CM | POA: Diagnosis not present

## 2019-09-02 DIAGNOSIS — I10 Essential (primary) hypertension: Secondary | ICD-10-CM | POA: Diagnosis not present

## 2019-09-02 DIAGNOSIS — E1140 Type 2 diabetes mellitus with diabetic neuropathy, unspecified: Secondary | ICD-10-CM | POA: Diagnosis not present

## 2019-09-02 DIAGNOSIS — F325 Major depressive disorder, single episode, in full remission: Secondary | ICD-10-CM | POA: Diagnosis not present

## 2019-09-06 ENCOUNTER — Ambulatory Visit: Payer: PPO | Admitting: Physician Assistant

## 2019-09-14 DIAGNOSIS — E7800 Pure hypercholesterolemia, unspecified: Secondary | ICD-10-CM | POA: Diagnosis not present

## 2019-09-14 DIAGNOSIS — I10 Essential (primary) hypertension: Secondary | ICD-10-CM | POA: Diagnosis not present

## 2019-09-14 DIAGNOSIS — E1129 Type 2 diabetes mellitus with other diabetic kidney complication: Secondary | ICD-10-CM | POA: Diagnosis not present

## 2019-09-17 ENCOUNTER — Other Ambulatory Visit: Payer: Self-pay | Admitting: Geriatric Medicine

## 2019-09-17 DIAGNOSIS — M5416 Radiculopathy, lumbar region: Secondary | ICD-10-CM

## 2019-09-26 ENCOUNTER — Other Ambulatory Visit: Payer: Self-pay

## 2019-09-26 ENCOUNTER — Ambulatory Visit
Admission: RE | Admit: 2019-09-26 | Discharge: 2019-09-26 | Disposition: A | Discharge status: Home / Self Care | Payer: PPO | Source: Ambulatory Visit | Attending: Geriatric Medicine | Admitting: Geriatric Medicine

## 2019-09-26 DIAGNOSIS — E1121 Type 2 diabetes mellitus with diabetic nephropathy: Secondary | ICD-10-CM | POA: Diagnosis not present

## 2019-09-26 DIAGNOSIS — E7800 Pure hypercholesterolemia, unspecified: Secondary | ICD-10-CM | POA: Diagnosis not present

## 2019-09-26 DIAGNOSIS — M48061 Spinal stenosis, lumbar region without neurogenic claudication: Secondary | ICD-10-CM | POA: Diagnosis not present

## 2019-09-26 DIAGNOSIS — M5416 Radiculopathy, lumbar region: Secondary | ICD-10-CM

## 2019-09-26 DIAGNOSIS — F325 Major depressive disorder, single episode, in full remission: Secondary | ICD-10-CM | POA: Diagnosis not present

## 2019-09-26 DIAGNOSIS — F3342 Major depressive disorder, recurrent, in full remission: Secondary | ICD-10-CM | POA: Diagnosis not present

## 2019-09-26 DIAGNOSIS — E1129 Type 2 diabetes mellitus with other diabetic kidney complication: Secondary | ICD-10-CM | POA: Diagnosis not present

## 2019-09-26 DIAGNOSIS — I10 Essential (primary) hypertension: Secondary | ICD-10-CM | POA: Diagnosis not present

## 2019-09-26 DIAGNOSIS — E1140 Type 2 diabetes mellitus with diabetic neuropathy, unspecified: Secondary | ICD-10-CM | POA: Diagnosis not present

## 2019-09-26 DIAGNOSIS — N401 Enlarged prostate with lower urinary tract symptoms: Secondary | ICD-10-CM | POA: Diagnosis not present

## 2019-10-05 DIAGNOSIS — I10 Essential (primary) hypertension: Secondary | ICD-10-CM | POA: Diagnosis not present

## 2019-10-10 ENCOUNTER — Ambulatory Visit: Payer: PPO | Admitting: Cardiovascular Disease

## 2019-10-31 DIAGNOSIS — E1140 Type 2 diabetes mellitus with diabetic neuropathy, unspecified: Secondary | ICD-10-CM | POA: Diagnosis not present

## 2019-10-31 DIAGNOSIS — E113299 Type 2 diabetes mellitus with mild nonproliferative diabetic retinopathy without macular edema, unspecified eye: Secondary | ICD-10-CM | POA: Diagnosis not present

## 2019-10-31 DIAGNOSIS — Z794 Long term (current) use of insulin: Secondary | ICD-10-CM | POA: Diagnosis not present

## 2019-10-31 DIAGNOSIS — E1129 Type 2 diabetes mellitus with other diabetic kidney complication: Secondary | ICD-10-CM | POA: Diagnosis not present

## 2019-10-31 DIAGNOSIS — N1831 Chronic kidney disease, stage 3a: Secondary | ICD-10-CM | POA: Diagnosis not present

## 2019-10-31 DIAGNOSIS — I10 Essential (primary) hypertension: Secondary | ICD-10-CM | POA: Diagnosis not present

## 2019-11-01 DIAGNOSIS — N401 Enlarged prostate with lower urinary tract symptoms: Secondary | ICD-10-CM | POA: Diagnosis not present

## 2019-11-01 DIAGNOSIS — F3342 Major depressive disorder, recurrent, in full remission: Secondary | ICD-10-CM | POA: Diagnosis not present

## 2019-11-01 DIAGNOSIS — E1129 Type 2 diabetes mellitus with other diabetic kidney complication: Secondary | ICD-10-CM | POA: Diagnosis not present

## 2019-11-01 DIAGNOSIS — F325 Major depressive disorder, single episode, in full remission: Secondary | ICD-10-CM | POA: Diagnosis not present

## 2019-11-01 DIAGNOSIS — E1140 Type 2 diabetes mellitus with diabetic neuropathy, unspecified: Secondary | ICD-10-CM | POA: Diagnosis not present

## 2019-11-01 DIAGNOSIS — I10 Essential (primary) hypertension: Secondary | ICD-10-CM | POA: Diagnosis not present

## 2019-11-01 DIAGNOSIS — E1121 Type 2 diabetes mellitus with diabetic nephropathy: Secondary | ICD-10-CM | POA: Diagnosis not present

## 2019-11-01 DIAGNOSIS — E7800 Pure hypercholesterolemia, unspecified: Secondary | ICD-10-CM | POA: Diagnosis not present

## 2019-11-04 DIAGNOSIS — I10 Essential (primary) hypertension: Secondary | ICD-10-CM | POA: Diagnosis not present

## 2019-11-14 DIAGNOSIS — N1831 Chronic kidney disease, stage 3a: Secondary | ICD-10-CM | POA: Diagnosis not present

## 2019-11-21 DIAGNOSIS — N529 Male erectile dysfunction, unspecified: Secondary | ICD-10-CM | POA: Diagnosis not present

## 2019-11-21 DIAGNOSIS — I129 Hypertensive chronic kidney disease with stage 1 through stage 4 chronic kidney disease, or unspecified chronic kidney disease: Secondary | ICD-10-CM | POA: Diagnosis not present

## 2019-11-21 DIAGNOSIS — R29898 Other symptoms and signs involving the musculoskeletal system: Secondary | ICD-10-CM | POA: Diagnosis not present

## 2019-11-21 DIAGNOSIS — N1831 Chronic kidney disease, stage 3a: Secondary | ICD-10-CM | POA: Diagnosis not present

## 2019-11-22 DIAGNOSIS — E1121 Type 2 diabetes mellitus with diabetic nephropathy: Secondary | ICD-10-CM | POA: Diagnosis not present

## 2019-11-22 DIAGNOSIS — I129 Hypertensive chronic kidney disease with stage 1 through stage 4 chronic kidney disease, or unspecified chronic kidney disease: Secondary | ICD-10-CM | POA: Diagnosis not present

## 2019-11-22 DIAGNOSIS — E7800 Pure hypercholesterolemia, unspecified: Secondary | ICD-10-CM | POA: Diagnosis not present

## 2019-11-22 DIAGNOSIS — N401 Enlarged prostate with lower urinary tract symptoms: Secondary | ICD-10-CM | POA: Diagnosis not present

## 2019-11-22 DIAGNOSIS — E1140 Type 2 diabetes mellitus with diabetic neuropathy, unspecified: Secondary | ICD-10-CM | POA: Diagnosis not present

## 2019-11-22 DIAGNOSIS — I10 Essential (primary) hypertension: Secondary | ICD-10-CM | POA: Diagnosis not present

## 2019-11-22 DIAGNOSIS — E1129 Type 2 diabetes mellitus with other diabetic kidney complication: Secondary | ICD-10-CM | POA: Diagnosis not present

## 2019-11-22 DIAGNOSIS — F3342 Major depressive disorder, recurrent, in full remission: Secondary | ICD-10-CM | POA: Diagnosis not present

## 2019-11-22 DIAGNOSIS — F325 Major depressive disorder, single episode, in full remission: Secondary | ICD-10-CM | POA: Diagnosis not present

## 2019-12-05 DIAGNOSIS — I10 Essential (primary) hypertension: Secondary | ICD-10-CM | POA: Diagnosis not present

## 2020-01-01 DIAGNOSIS — E1140 Type 2 diabetes mellitus with diabetic neuropathy, unspecified: Secondary | ICD-10-CM | POA: Diagnosis not present

## 2020-01-01 DIAGNOSIS — I10 Essential (primary) hypertension: Secondary | ICD-10-CM | POA: Diagnosis not present

## 2020-01-01 DIAGNOSIS — I129 Hypertensive chronic kidney disease with stage 1 through stage 4 chronic kidney disease, or unspecified chronic kidney disease: Secondary | ICD-10-CM | POA: Diagnosis not present

## 2020-01-01 DIAGNOSIS — E7800 Pure hypercholesterolemia, unspecified: Secondary | ICD-10-CM | POA: Diagnosis not present

## 2020-01-01 DIAGNOSIS — E1121 Type 2 diabetes mellitus with diabetic nephropathy: Secondary | ICD-10-CM | POA: Diagnosis not present

## 2020-01-01 DIAGNOSIS — F325 Major depressive disorder, single episode, in full remission: Secondary | ICD-10-CM | POA: Diagnosis not present

## 2020-01-01 DIAGNOSIS — E1129 Type 2 diabetes mellitus with other diabetic kidney complication: Secondary | ICD-10-CM | POA: Diagnosis not present

## 2020-01-01 DIAGNOSIS — N401 Enlarged prostate with lower urinary tract symptoms: Secondary | ICD-10-CM | POA: Diagnosis not present

## 2020-01-01 DIAGNOSIS — F3342 Major depressive disorder, recurrent, in full remission: Secondary | ICD-10-CM | POA: Diagnosis not present

## 2020-01-02 DIAGNOSIS — J3089 Other allergic rhinitis: Secondary | ICD-10-CM | POA: Diagnosis not present

## 2020-01-02 DIAGNOSIS — K219 Gastro-esophageal reflux disease without esophagitis: Secondary | ICD-10-CM | POA: Diagnosis not present

## 2020-01-11 DIAGNOSIS — E1129 Type 2 diabetes mellitus with other diabetic kidney complication: Secondary | ICD-10-CM | POA: Diagnosis not present

## 2020-01-11 DIAGNOSIS — I129 Hypertensive chronic kidney disease with stage 1 through stage 4 chronic kidney disease, or unspecified chronic kidney disease: Secondary | ICD-10-CM | POA: Diagnosis not present

## 2020-01-11 DIAGNOSIS — E1140 Type 2 diabetes mellitus with diabetic neuropathy, unspecified: Secondary | ICD-10-CM | POA: Diagnosis not present

## 2020-01-11 DIAGNOSIS — F325 Major depressive disorder, single episode, in full remission: Secondary | ICD-10-CM | POA: Diagnosis not present

## 2020-01-11 DIAGNOSIS — E1121 Type 2 diabetes mellitus with diabetic nephropathy: Secondary | ICD-10-CM | POA: Diagnosis not present

## 2020-01-11 DIAGNOSIS — E7800 Pure hypercholesterolemia, unspecified: Secondary | ICD-10-CM | POA: Diagnosis not present

## 2020-01-11 DIAGNOSIS — N401 Enlarged prostate with lower urinary tract symptoms: Secondary | ICD-10-CM | POA: Diagnosis not present

## 2020-01-11 DIAGNOSIS — F3342 Major depressive disorder, recurrent, in full remission: Secondary | ICD-10-CM | POA: Diagnosis not present

## 2020-01-11 DIAGNOSIS — I10 Essential (primary) hypertension: Secondary | ICD-10-CM | POA: Diagnosis not present

## 2020-01-13 DIAGNOSIS — I10 Essential (primary) hypertension: Secondary | ICD-10-CM | POA: Diagnosis not present

## 2020-01-13 DIAGNOSIS — M7062 Trochanteric bursitis, left hip: Secondary | ICD-10-CM | POA: Diagnosis not present

## 2020-01-13 DIAGNOSIS — M7061 Trochanteric bursitis, right hip: Secondary | ICD-10-CM | POA: Diagnosis not present

## 2020-01-13 DIAGNOSIS — Z79899 Other long term (current) drug therapy: Secondary | ICD-10-CM | POA: Diagnosis not present

## 2020-01-13 DIAGNOSIS — Z794 Long term (current) use of insulin: Secondary | ICD-10-CM | POA: Diagnosis not present

## 2020-01-13 DIAGNOSIS — R269 Unspecified abnormalities of gait and mobility: Secondary | ICD-10-CM | POA: Diagnosis not present

## 2020-01-13 DIAGNOSIS — E1140 Type 2 diabetes mellitus with diabetic neuropathy, unspecified: Secondary | ICD-10-CM | POA: Diagnosis not present

## 2020-01-16 DIAGNOSIS — R269 Unspecified abnormalities of gait and mobility: Secondary | ICD-10-CM | POA: Diagnosis not present

## 2020-01-19 DIAGNOSIS — M25552 Pain in left hip: Secondary | ICD-10-CM | POA: Diagnosis not present

## 2020-01-19 DIAGNOSIS — M545 Low back pain: Secondary | ICD-10-CM | POA: Diagnosis not present

## 2020-01-19 DIAGNOSIS — M25551 Pain in right hip: Secondary | ICD-10-CM | POA: Diagnosis not present

## 2020-01-25 NOTE — Progress Notes (Signed)
NEUROLOGY FOLLOW UP OFFICE NOTE  DASHIEL Mullen [id number]  HISTORY OF PRESENT ILLNESS: [name] is an 84 year old right-handed male with hypertension, mild cognitive impairment, CAD status post MI, hyperlipidemia, sick sinus syndrome, and type 2 diabetes mellitus who follows up for progressive gait disorder.  He is accompanied by his wife who supplements history.  UPDATE: He reports paiin in both hips while walking.  He feels weak in the lower legs.  He needs to use the cane or he will fall. He has had about 7 falls in the last 6 months, without the cane.  Not associated with dizziness or back pain.    HISTORY: He and his wife began noticing problems with balance in late 2018, which has gradually gotten worse.  He was initially seen by me in consultation in 2019.  At that time he endorsed that when walking, he feels off-balance.  He feels that he is taking shorter steps and dragging his heels, right foot worse than left foot.  He fell one time while standing at the commode to urinate, he lost his balance and fell forward.  He says his legs feel weak.  While he has some neuropathic pain in the feet, he denies sensation of numbness or difficulty feeling the ground when standing.  He has history of low back pain with lumbar radiculopathy, but currently denies low back or radicular pain in the legs.   remote MRI of lumbar spine from 12/04/09 demonstrated right L4-L5 synovial cyst arising from the facet joint, compressing the right L5 nerve root.  He reported bilateral hip pain and has arthritic changes in the hips.  He denied change in bowel or bladder function, although he does wake up during the night to urinate.  He denied neck pain or symptoms in the upper extremities.  He denied difficulty with vision.  He denied freezing, tremor or difficulty swallowing.  At the time, he had uncontrolled type 2 diabetes mellitus with A1c of 7.7 and borderline B12 level for which he started supplements.   He reported that gait improved with physical therapy.  However, he subsequently developed worsening problems with gait and balance over the past year.  He discontinued Crestor but weakness didn't improve.  MRI of lumbar spine on 09/26/2019 showed stable multilevel spondylosis with mild spinal stenosis at L3-4 and small synovial cyst from left facet joint at L5-S1 possibly encroaching the left S1 nerve root.  Hgb A1c from 10/31/2019 was 9.2 (8.1 in October 2020).  TSH from October 2020 was 1.31.  MRI of brain with and without contrast on 01/16/2020 showed moderate atrophy and chronic small vessel ischemic changes in the periventricular and subcortical white matter but no acute intracranial abnormality or hydrocephalus.  Family history is notable as his brother has dementia with Lewy Body disease and his mother had history of memory difficulty.  Carotid doppler from 09/15/17 demonstrated no hemodynamically significant ICA stenosis with antegrade flow in both vertebral arteries.   PAST MEDICAL HISTORY: Past Medical History:  Diagnosis Date  . CAD (coronary artery disease)    Bare meta stent RCA  . Carotid stenosis    a. Carotid US 3/17: bilateral ICA 1-39% >> repeat 2 years  . Diabetes mellitus ([place])   . History of nuclear stress test    Myoview 12/16: EF 66%, very mild inferior ischemia; Low Risk  . HTN (hypertension)   . Hyperlipidemia   . Peripheral neuropathy     MEDICATIONS: Current Outpatient Medications on File  Prior to Visit  Medication Sig Dispense Refill  . allopurinol (ZYLOPRIM) 300 MG tablet Take 300 mg by mouth daily.     Marland Kitchen amLODipine (NORVASC) 10 MG tablet TAKE 1 TABLET ONCE DAILY. 30 tablet 11  . aspirin 81 MG tablet Take 1 tablet (81 mg total) by mouth daily. 30 tablet 6  . BENICAR 20 MG tablet Take 20 mg by mouth daily.     . calcium-vitamin D (OSCAL WITH D) 500-200 MG-UNIT per tablet Take 1 tablet by mouth daily. Vitamin D 3  daily    . citalopram (CELEXA) 40 MG tablet  Take 40 mg by mouth daily.     . Coenzyme Q10 (CO Q 10 PO) Take by mouth. 1 tab daily    . CRESTOR 20 MG tablet Take 10 mg by mouth daily.     . diazepam (VALIUM) 5 MG tablet Take 5 mg by mouth daily as needed for anxiety.     . diphenoxylate-atropine (LOMOTIL) 2.5-0.025 MG per tablet Take 1 tablet by mouth 4 (four) times daily as needed for diarrhea or loose stools.    . fluticasone (FLONASE) 50 MCG/ACT nasal spray Place 2 sprays into both nostrils as needed for allergies or rhinitis.    . hydrochlorothiazide (HYDRODIURIL) 25 MG tablet TAKE 1 TABLET ONCE DAILY. 90 tablet 3  . ipratropium-albuterol (DUONEB) 0.5-2.5 (3) MG/3ML SOLN Inhale 3 mLs into the lungs every 4 (four) hours as needed.    . irbesartan (AVAPRO) 150 MG tablet     . LANTUS SOLOSTAR 100 UNIT/ML injection Inject 54 Units into the skin every morning.     . metFORMIN (GLUCOPHAGE-XR) 500 MG 24 hr tablet Take 500 mg by mouth daily.    . nabumetone (RELAFEN) 500 MG tablet Take 500 mg by mouth 2 (two) times daily as needed for mild pain.     . nitroGLYCERIN (NITROSTAT) 0.4 MG SL tablet PLACE 1 TABLET UNDER THE TONGUE EVERY 5 MINUTES AS NEEDED FOR CHEST PAIN. 25 tablet 5  . Saline (SIMPLY SALINE) 0.9 % AERS Place into the nose.    . sitaGLIPtin (JANUVIA) 50 MG tablet Take 50 mg by mouth daily.    . vitamin B-12 (CYANOCOBALAMIN) 500 MCG tablet Take 500 mcg by mouth daily.    Marland Kitchen zolpidem (AMBIEN) 10 MG tablet Take 5 mg by mouth at bedtime. For sleep     No current facility-administered medications on file prior to visit.    ALLERGIES: Allergies  Allergen Reactions  . Ace Inhibitors   . Alpha Lipoic Acid [Lipoic Acid]     Upset Stomach  . Invokana [Canagliflozin]   . Heparin Hives  . Isosorbide Mononitrate [Isosorbide Dinitrate Er] Hives  . Penicillins Hives  . Sulfonamide Derivatives Hives    FAMILY HISTORY: Family History  Problem Relation Age of Onset  . Heart failure Mother   . Stroke Mother   . Heart attack Father 50      SOCIAL HISTORY: Social History   Socioeconomic History  . Marital status: Married    Spouse name: Webb Silversmith  . Number of children: 3  . Years of education: Not on file  . Highest education level: Bachelor's degree (e.g., BA, AB, BS)  Occupational History  . Occupation: Retired-CPA. Former Tourist information centre manager  Tobacco Use  . Smoking status: Former Smoker    Packs/day: 0.50    Years: 10.00    Pack years: 5.00    Types: Cigarettes    Quit date: 02/05/1972    Years since  quitting: 48.0  . Smokeless tobacco: Never Used  Vaping Use  . Vaping Use: Never used  Substance and Sexual Activity  . Alcohol use: Yes    Alcohol/week: 7.0 standard drinks    Types: 7 Standard drinks or equivalent per week  . Drug use: No  . Sexual activity: Not on file  Other Topics Concern  . Not on file  Social History Narrative   Married, lives with wife in a one story home. [facility])  Pt is right handed. He drinks hot tea 5x week.   Social Determinants of Health   Financial Resource Strain:   . Difficulty of Paying Living Expenses:   Food Insecurity:   . Worried About Charity fundraiser in the Last Year:   . Arboriculturist in the Last Year:   Transportation Needs:   . Film/video editor (Medical):   Marland Kitchen Lack of Transportation (Non-Medical):   Physical Activity:   . Days of Exercise per Week:   . Minutes of Exercise per Session:   Stress:   . Feeling of Stress :   Social Connections:   . Frequency of Communication with Friends and Family:   . Frequency of Social Gatherings with Friends and Family:   . Attends Religious Services:   . Active Member of Clubs or Organizations:   . Attends Archivist Meetings:   Marland Kitchen Marital Status:   Intimate Partner Violence:   . Fear of Current or Ex-Partner:   . Emotionally Abused:   Marland Kitchen Physically Abused:   . Sexually Abused:     PHYSICAL EXAM: Blood pressure (!) 153/69, pulse 84, weight 188 lb (85.3 kg), SpO2 96 %. General: No acute  distress.  Patient appears well-groomed.   Head:  Normocephalic/atraumatic Eyes:  Fundi examined but not visualized Neck: supple, no paraspinal tenderness, full range of motion Heart:  Regular rate and rhythm Lungs:  Clear to auscultation bilaterally Back: No paraspinal tenderness Neurological Exam: alert and oriented to person, place, and time. Attention span and concentration intact, recent and remote memory intact, fund of knowledge intact.  Speech fluent and not dysarthric, language intact.  CN II-XII intact. Bulk and tone normal, muscle strength 5/5 throughout.  Sensation to pinprick mildly reduced in toes.  Vibratory sensation reduced in feet.  Deep tendon reflexes 2+ throughout, toes downgoing.  Finger to nose and heel to shin testing intact.  Wide-based gait.  Romberg with mild sway.  IMPRESSION: Unsteady gait.  I suspect etiology is due to polyneuropathy secondary to diabetes mellitus.  He does not exhibit any signs of a neurodegenerative disorder such as Parkinson's disease.  MRI of brain does not reveal anything significant, such as stroke, mass lesion or hydrocephalus.  MRI of lumbar spine does not reveal any significant stenosis or nerve root impingement.  He does not exhibit any signs of myelopathy on exam.  He does not exhibit any significant objective muscle weakness.  He does exhibit sensory loss in the feet.  I suspect the worsening balance and increased falls over the past several months is related to deconditioning (more sedentary during the COVID pandemic) and worsening diabetes over the past year.    PLAN: At this point, I recommend continuing use of cane and to continue trying to optimize glycemic control.  I explained my findings and impression with the patient, his wife and daughter in the office as well as his son via face time.  [name], DO  CC: Lajean Manes, MD

## 2020-01-26 ENCOUNTER — Encounter: Payer: Self-pay | Admitting: Neurology

## 2020-01-26 ENCOUNTER — Other Ambulatory Visit: Payer: Self-pay

## 2020-01-26 ENCOUNTER — Ambulatory Visit: Payer: PPO | Admitting: Neurology

## 2020-01-26 VITALS — BP 153/69 | HR 84 | Wt 188.0 lb

## 2020-01-26 DIAGNOSIS — E1142 Type 2 diabetes mellitus with diabetic polyneuropathy: Secondary | ICD-10-CM

## 2020-01-26 DIAGNOSIS — R2681 Unsteadiness on feet: Secondary | ICD-10-CM | POA: Diagnosis not present

## 2020-01-26 DIAGNOSIS — Z794 Long term (current) use of insulin: Secondary | ICD-10-CM | POA: Diagnosis not present

## 2020-01-26 NOTE — Patient Instructions (Addendum)
I think the balance problem is primarily due to peripheral neuropathy from the diabetes MRI does not show anything involving the brain that would be causing it. MRI of lower back does not reveal anything Your exam does not reveal anything to suspect a disc pinching the spinal cord You do not have any symptoms of Parkinson's disease  Going forward, I would continue using the cane and continue to try and get the diabetes controlled.

## 2020-01-28 DIAGNOSIS — M26609 Unspecified temporomandibular joint disorder, unspecified side: Secondary | ICD-10-CM | POA: Diagnosis not present

## 2020-02-09 DIAGNOSIS — F325 Major depressive disorder, single episode, in full remission: Secondary | ICD-10-CM | POA: Diagnosis not present

## 2020-02-09 DIAGNOSIS — N1831 Chronic kidney disease, stage 3a: Secondary | ICD-10-CM | POA: Diagnosis not present

## 2020-02-09 DIAGNOSIS — E7800 Pure hypercholesterolemia, unspecified: Secondary | ICD-10-CM | POA: Diagnosis not present

## 2020-02-09 DIAGNOSIS — I129 Hypertensive chronic kidney disease with stage 1 through stage 4 chronic kidney disease, or unspecified chronic kidney disease: Secondary | ICD-10-CM | POA: Diagnosis not present

## 2020-02-09 DIAGNOSIS — F3342 Major depressive disorder, recurrent, in full remission: Secondary | ICD-10-CM | POA: Diagnosis not present

## 2020-02-09 DIAGNOSIS — I10 Essential (primary) hypertension: Secondary | ICD-10-CM | POA: Diagnosis not present

## 2020-02-09 DIAGNOSIS — E1140 Type 2 diabetes mellitus with diabetic neuropathy, unspecified: Secondary | ICD-10-CM | POA: Diagnosis not present

## 2020-02-09 DIAGNOSIS — E1129 Type 2 diabetes mellitus with other diabetic kidney complication: Secondary | ICD-10-CM | POA: Diagnosis not present

## 2020-02-09 DIAGNOSIS — N401 Enlarged prostate with lower urinary tract symptoms: Secondary | ICD-10-CM | POA: Diagnosis not present

## 2020-02-09 DIAGNOSIS — E1121 Type 2 diabetes mellitus with diabetic nephropathy: Secondary | ICD-10-CM | POA: Diagnosis not present

## 2020-02-14 DIAGNOSIS — E1129 Type 2 diabetes mellitus with other diabetic kidney complication: Secondary | ICD-10-CM | POA: Diagnosis not present

## 2020-02-14 DIAGNOSIS — N1831 Chronic kidney disease, stage 3a: Secondary | ICD-10-CM | POA: Diagnosis not present

## 2020-02-14 DIAGNOSIS — I129 Hypertensive chronic kidney disease with stage 1 through stage 4 chronic kidney disease, or unspecified chronic kidney disease: Secondary | ICD-10-CM | POA: Diagnosis not present

## 2020-02-14 DIAGNOSIS — I10 Essential (primary) hypertension: Secondary | ICD-10-CM | POA: Diagnosis not present

## 2020-02-14 DIAGNOSIS — Z794 Long term (current) use of insulin: Secondary | ICD-10-CM | POA: Diagnosis not present

## 2020-02-14 DIAGNOSIS — R269 Unspecified abnormalities of gait and mobility: Secondary | ICD-10-CM | POA: Diagnosis not present

## 2020-02-14 DIAGNOSIS — E1140 Type 2 diabetes mellitus with diabetic neuropathy, unspecified: Secondary | ICD-10-CM | POA: Diagnosis not present

## 2020-02-20 ENCOUNTER — Other Ambulatory Visit: Payer: Self-pay

## 2020-02-20 ENCOUNTER — Ambulatory Visit: Payer: PPO | Admitting: Cardiovascular Disease

## 2020-02-20 ENCOUNTER — Encounter: Payer: Self-pay | Admitting: Cardiovascular Disease

## 2020-02-20 VITALS — BP 118/64 | HR 77 | Ht 74.0 in | Wt 184.0 lb

## 2020-02-20 DIAGNOSIS — I6523 Occlusion and stenosis of bilateral carotid arteries: Secondary | ICD-10-CM

## 2020-02-20 DIAGNOSIS — I2510 Atherosclerotic heart disease of native coronary artery without angina pectoris: Secondary | ICD-10-CM | POA: Diagnosis not present

## 2020-02-20 DIAGNOSIS — I10 Essential (primary) hypertension: Secondary | ICD-10-CM

## 2020-02-20 NOTE — Progress Notes (Signed)
Chief Complaint  Patient presents with  . Follow-up    CAD   History of Present Illness: 84 yo male with history of CAD, HTN and hyperlipidemia here today for cardiac follow up. He underwent stenting of the right coronary artery in March 2009 with a bare metal stent. Nuclear stress test August 2013 showed no large areas of ischemia.  Echo 2013 with preserved LV function and no significant valvular issues. Since he had T wave inversions that were worrisome on the stress test, cardiac cath was arranged August 2013 and showed stable disease with patent stent RCA. Carotid artery dopplers March 2019 with mild bilateral ICA stenosis. Stress myoview 07/03/15 low risk with no large areas of ischemia. He tried Imdur but it made him itch.   He is here today for follow up. The patient denies any chest pain, dyspnea, palpitations, lower extremity edema, orthopnea, PND, dizziness, near syncope or syncope. He continues to have leg weakness which has been going on for years. No improvement several years ago off of statin. He has seen primary care and this is felt to be due to peripheral neuropathy.   Primary Care Physician: Lajean Manes, MD  Past Medical History:  Diagnosis Date  . CAD (coronary artery disease)    Bare meta stent RCA  . Carotid stenosis    a. Carotid US 3/17: bilateral ICA 1-39% >> repeat 2 years  . Diabetes mellitus ([place])   . History of nuclear stress test    Myoview 12/16: EF 66%, very mild inferior ischemia; Low Risk  . HTN (hypertension)   . Hyperlipidemia   . Peripheral neuropathy     Past Surgical History:  Procedure Laterality Date  . LEFT HEART CATHETERIZATION WITH CORONARY ANGIOGRAM N/A 03/04/2012   Procedure: LEFT HEART CATHETERIZATION WITH CORONARY ANGIOGRAM;  Surgeon: Burnell Blanks, MD;  Location: [facility] CATH LAB;  Service: Cardiovascular;  Laterality: N/A;  . TONSILLECTOMY      Current Outpatient Medications  Medication Sig Dispense Refill  . allopurinol  (ZYLOPRIM) 300 MG tablet Take 300 mg by mouth daily.     Marland Kitchen amLODipine (NORVASC) 10 MG tablet TAKE 1 TABLET ONCE DAILY. 30 tablet 11  . aspirin 81 MG tablet Take 1 tablet (81 mg total) by mouth daily. 30 tablet 6  . BENICAR 20 MG tablet Take 20 mg by mouth daily.     . calcium-vitamin D (OSCAL WITH D) 500-200 MG-UNIT per tablet Take 1 tablet by mouth daily. Vitamin D 3  daily    . citalopram (CELEXA) 40 MG tablet Take 40 mg by mouth daily.     . Coenzyme Q10 (CO Q 10 PO) Take by mouth. 1 tab daily    . CRESTOR 20 MG tablet Take 10 mg by mouth daily.     . diazepam (VALIUM) 5 MG tablet Take 5 mg by mouth daily as needed for anxiety.     . diphenoxylate-atropine (LOMOTIL) 2.5-0.025 MG per tablet Take 1 tablet by mouth 4 (four) times daily as needed for diarrhea or loose stools.    . famotidine (PEPCID) 20 MG tablet Take 20 mg by mouth 2 (two) times daily.    . fluticasone (FLONASE) 50 MCG/ACT nasal spray Place 2 sprays into both nostrils as needed for allergies or rhinitis.    . hydrochlorothiazide (HYDRODIURIL) 25 MG tablet TAKE 1 TABLET ONCE DAILY. 90 tablet 3  . ipratropium-albuterol (DUONEB) 0.5-2.5 (3) MG/3ML SOLN Inhale 3 mLs into the lungs every 4 (four) hours as needed.    Marland Kitchen  JANUVIA 100 MG tablet Take 100 mg by mouth daily.    Marland Kitchen LANTUS SOLOSTAR 100 UNIT/ML injection Inject 56-58 Units into the skin every morning.     . metFORMIN (GLUCOPHAGE-XR) 500 MG 24 hr tablet Take 500 mg by mouth daily.    . montelukast (SINGULAIR) 10 MG tablet Take 10 mg by mouth daily.    . nabumetone (RELAFEN) 500 MG tablet Take 500 mg by mouth 2 (two) times daily as needed for mild pain.     . nitroGLYCERIN (NITROSTAT) 0.4 MG SL tablet PLACE 1 TABLET UNDER THE TONGUE EVERY 5 MINUTES AS NEEDED FOR CHEST PAIN. 25 tablet 5  . Saline (SIMPLY SALINE) 0.9 % AERS Place into the nose.    . spironolactone (ALDACTONE) 25 MG tablet Take 25 mg by mouth daily.    . vitamin B-12 (CYANOCOBALAMIN) 500 MCG tablet Take 500 mcg by  mouth daily.    Marland Kitchen zolpidem (AMBIEN) 10 MG tablet Take 5 mg by mouth at bedtime. For sleep     No current facility-administered medications for this visit.    Allergies  Allergen Reactions  . Ace Inhibitors   . Alpha Lipoic Acid [Lipoic Acid]     Upset Stomach  . Invokana [Canagliflozin]   . Heparin Hives  . Isosorbide Mononitrate [Isosorbide Dinitrate Er] Hives  . Penicillins Hives  . Sulfonamide Derivatives Hives    Social History   Socioeconomic History  . Marital status: Married    Spouse name: Webb Silversmith  . Number of children: 3  . Years of education: Not on file  . Highest education level: Bachelor's degree (e.g., BA, AB, BS)  Occupational History  . Occupation: Retired-CPA. Former Tourist information centre manager  Tobacco Use  . Smoking status: Former Smoker    Packs/day: 0.50    Years: 10.00    Pack years: 5.00    Types: Cigarettes    Quit date: 02/05/1972    Years since quitting: 48.0  . Smokeless tobacco: Never Used  Vaping Use  . Vaping Use: Never used  Substance and Sexual Activity  . Alcohol use: Yes    Alcohol/week: 7.0 standard drinks    Types: 7 Standard drinks or equivalent per week  . Drug use: No  . Sexual activity: Not on file  Other Topics Concern  . Not on file  Social History Narrative   Married, lives with wife in a one story home. [facility])  Pt is right handed. He drinks hot tea 5x week.   Social Determinants of Health   Financial Resource Strain:   . Difficulty of Paying Living Expenses:   Food Insecurity:   . Worried About Charity fundraiser in the Last Year:   . Arboriculturist in the Last Year:   Transportation Needs:   . Film/video editor (Medical):   Marland Kitchen Lack of Transportation (Non-Medical):   Physical Activity:   . Days of Exercise per Week:   . Minutes of Exercise per Session:   Stress:   . Feeling of Stress :   Social Connections:   . Frequency of Communication with Friends and Family:   . Frequency of Social Gatherings with  Friends and Family:   . Attends Religious Services:   . Active Member of Clubs or Organizations:   . Attends Archivist Meetings:   Marland Kitchen Marital Status:   Intimate Partner Violence:   . Fear of Current or Ex-Partner:   . Emotionally Abused:   Marland Kitchen Physically Abused:   .  Sexually Abused:     Family History  Problem Relation Age of Onset  . Heart failure Mother   . Stroke Mother   . Heart attack Father 49    Review of Systems:  As stated in the HPI and otherwise negative.   BP 118/64   Pulse 77   Ht 6\' 2"  (1.88 m)   Wt 184 lb (83.5 kg)   SpO2 97%   BMI 23.62 kg/m   Physical Examination:  General: Well developed, well nourished, NAD  HEENT: OP clear, mucus membranes moist  SKIN: warm, dry. No rashes. Neuro: No focal deficits  Musculoskeletal: Muscle strength 5/5 all ext  Psychiatric: Mood and affect normal  Neck: No JVD, no carotid bruits, no thyromegaly, no lymphadenopathy.  Lungs:Clear bilaterally, no wheezes, rhonci, crackles Cardiovascular: Regular rate and rhythm. No murmurs, gallops or rubs. Abdomen:Soft. Bowel sounds present. Non-tender.  Extremities: No lower extremity edema. Pulses are 2 + in the bilateral DP/PT.  Cardiac cath; 03/04/12:  Left main: No disease noted.  Left Anterior Descending Artery: Large caliber vessel that courses to the apex. There is calcification in the proximal and mid vessel. The proximal vessel has a 50% eccentric stenosis. The mid vessel has a long, smooth 50% stenosis.  [place] Intermediate: Moderate sized vessel with mild plaque disease.  Circumflex Artery: Large caliber vessel with mild plaque. The first OM branch is very small. The second OM branch is small with a 30% ostial stenosis.  Right Coronary Artery: Large, dominant vessel with 40% proximal stenosis. The mid vessel has a patent stent with minimal in-stent restenosis. The distal vessel has a 30% stenosis. The PDA and PLA are patent mild diffuse plaque disease.  Left  Ventricular Angiogram: LVEF 55%.   Carotid artery dopplers 04/06/13: 0-39% bilateral stenosis.   EKG:  EKG is ordered today. The ekg ordered today demonstrates sinus PACs  Recent Labs: No results found for requested labs within last 8760 hours.   Lipid Panel   Wt Readings from Last 3 Encounters:  02/20/20 184 lb (83.5 kg)  01/26/20 188 lb (85.3 kg)  08/19/18 209 lb (94.8 kg)     Other studies Reviewed: Additional studies/ records that were reviewed today include: . Review of the above records demonstrates:    Assessment and Plan:   1. CAD without angina: He has no chest pain. Last cath in August 2013 with moderate CAD. Stress myoview December 2016 was low risk. Will continue ASA and statin. Will arrange an echo today to assess LV function.   2. HTN: BP controlled. No changes  3. Carotid artery disease: Mild bilateral carotid disease by dopplers March 2019.   Current medicines are reviewed at length with the patient today.  The patient does not have concerns regarding medicines.  The following changes have been made:  no change  Labs/ tests ordered today include:   Orders Placed This Encounter  Procedures  . EKG 12-Lead  . ECHOCARDIOGRAM COMPLETE    Disposition:   FU with me in 12  months  Signed, Lauree Chandler, MD 02/20/2020 12:34 PM    [facility] [place], [place], [place]  [address] Phone: [phone]; Fax: [phone]

## 2020-02-20 NOTE — Patient Instructions (Signed)
Medication Instructions:  Your physician recommends that you continue on your current medications as directed. Please refer to the Current Medication list given to you today.  *If you need a refill on your cardiac medications before your next appointment, please call your pharmacy*   Lab Work: None  If you have labs (blood work) drawn today and your tests are completely normal, you will receive your results only by: Marland Kitchen MyChart Message (if you have MyChart) OR . A paper copy in the mail If you have any lab test that is abnormal or we need to change your treatment, we will call you to review the results.   Testing/Procedures: Your physician has requested that you have an echocardiogram. Echocardiography is a painless test that uses sound waves to create images of your heart. It provides your doctor with information about the size and shape of your heart and how well your heart's chambers and valves are working. This procedure takes approximately one hour. There are no restrictions for this procedure.   Follow-Up: At [facility], you and your health needs are our priority.  As part of our continuing mission to provide you with exceptional heart care, we have created designated Provider Care Teams.  These Care Teams include your primary Cardiologist (physician) and Advanced Practice Providers (APPs -  Physician Assistants and Nurse Practitioners) who all work together to provide you with the care you need, when you need it.  We recommend signing up for the patient portal called "MyChart".  Sign up information is provided on this After Visit Summary.  MyChart is used to connect with patients for Virtual Visits (Telemedicine).  Patients are able to view lab/test results, encounter notes, upcoming appointments, etc.  Non-urgent messages can be sent to your provider as well.   To learn more about what you can do with MyChart, go to [email or web address].    Your next appointment:   12  month(s)  The format for your next appointment:   In Person  Provider:   You may see Lauree Chandler, MD or one of the following Advanced Practice Providers on your designated Care Team:    Melina Copa, PA-C  Ermalinda Barrios, PA-C    Other Instructions None

## 2020-02-21 ENCOUNTER — Other Ambulatory Visit (HOSPITAL_COMMUNITY): Payer: PPO

## 2020-02-28 DIAGNOSIS — H5340 Unspecified visual field defects: Secondary | ICD-10-CM | POA: Diagnosis not present

## 2020-02-28 DIAGNOSIS — H40023 Open angle with borderline findings, high risk, bilateral: Secondary | ICD-10-CM | POA: Diagnosis not present

## 2020-02-28 DIAGNOSIS — E113293 Type 2 diabetes mellitus with mild nonproliferative diabetic retinopathy without macular edema, bilateral: Secondary | ICD-10-CM | POA: Diagnosis not present

## 2020-02-28 DIAGNOSIS — H524 Presbyopia: Secondary | ICD-10-CM | POA: Diagnosis not present

## 2020-03-02 DIAGNOSIS — I10 Essential (primary) hypertension: Secondary | ICD-10-CM | POA: Diagnosis not present

## 2020-03-06 ENCOUNTER — Other Ambulatory Visit: Payer: Self-pay

## 2020-03-06 ENCOUNTER — Ambulatory Visit (HOSPITAL_COMMUNITY): Payer: PPO | Attending: Internal Medicine

## 2020-03-06 DIAGNOSIS — I10 Essential (primary) hypertension: Secondary | ICD-10-CM | POA: Diagnosis not present

## 2020-03-06 DIAGNOSIS — I2510 Atherosclerotic heart disease of native coronary artery without angina pectoris: Secondary | ICD-10-CM

## 2020-03-07 LAB — ECHOCARDIOGRAM COMPLETE
Area-P 1/2: 2.37 cm2
P 1/2 time: 362 msec
S' Lateral: 2.4 cm

## 2020-03-29 DIAGNOSIS — I129 Hypertensive chronic kidney disease with stage 1 through stage 4 chronic kidney disease, or unspecified chronic kidney disease: Secondary | ICD-10-CM | POA: Diagnosis not present

## 2020-03-29 DIAGNOSIS — N1831 Chronic kidney disease, stage 3a: Secondary | ICD-10-CM | POA: Diagnosis not present

## 2020-03-29 DIAGNOSIS — E1129 Type 2 diabetes mellitus with other diabetic kidney complication: Secondary | ICD-10-CM | POA: Diagnosis not present

## 2020-03-29 DIAGNOSIS — E1121 Type 2 diabetes mellitus with diabetic nephropathy: Secondary | ICD-10-CM | POA: Diagnosis not present

## 2020-03-29 DIAGNOSIS — F325 Major depressive disorder, single episode, in full remission: Secondary | ICD-10-CM | POA: Diagnosis not present

## 2020-03-29 DIAGNOSIS — E1140 Type 2 diabetes mellitus with diabetic neuropathy, unspecified: Secondary | ICD-10-CM | POA: Diagnosis not present

## 2020-03-29 DIAGNOSIS — N401 Enlarged prostate with lower urinary tract symptoms: Secondary | ICD-10-CM | POA: Diagnosis not present

## 2020-03-29 DIAGNOSIS — E7800 Pure hypercholesterolemia, unspecified: Secondary | ICD-10-CM | POA: Diagnosis not present

## 2020-03-29 DIAGNOSIS — F3342 Major depressive disorder, recurrent, in full remission: Secondary | ICD-10-CM | POA: Diagnosis not present

## 2020-03-29 DIAGNOSIS — I10 Essential (primary) hypertension: Secondary | ICD-10-CM | POA: Diagnosis not present

## 2020-04-10 DIAGNOSIS — F325 Major depressive disorder, single episode, in full remission: Secondary | ICD-10-CM | POA: Diagnosis not present

## 2020-04-10 DIAGNOSIS — I10 Essential (primary) hypertension: Secondary | ICD-10-CM | POA: Diagnosis not present

## 2020-04-10 DIAGNOSIS — E7800 Pure hypercholesterolemia, unspecified: Secondary | ICD-10-CM | POA: Diagnosis not present

## 2020-04-10 DIAGNOSIS — E1121 Type 2 diabetes mellitus with diabetic nephropathy: Secondary | ICD-10-CM | POA: Diagnosis not present

## 2020-04-10 DIAGNOSIS — N1831 Chronic kidney disease, stage 3a: Secondary | ICD-10-CM | POA: Diagnosis not present

## 2020-04-10 DIAGNOSIS — E1140 Type 2 diabetes mellitus with diabetic neuropathy, unspecified: Secondary | ICD-10-CM | POA: Diagnosis not present

## 2020-04-10 DIAGNOSIS — I129 Hypertensive chronic kidney disease with stage 1 through stage 4 chronic kidney disease, or unspecified chronic kidney disease: Secondary | ICD-10-CM | POA: Diagnosis not present

## 2020-04-16 DIAGNOSIS — Z79899 Other long term (current) drug therapy: Secondary | ICD-10-CM | POA: Diagnosis not present

## 2020-05-02 DIAGNOSIS — I10 Essential (primary) hypertension: Secondary | ICD-10-CM | POA: Diagnosis not present

## 2020-05-04 DIAGNOSIS — I10 Essential (primary) hypertension: Secondary | ICD-10-CM | POA: Diagnosis not present

## 2020-05-17 ENCOUNTER — Ambulatory Visit
Admission: RE | Admit: 2020-05-17 | Discharge: 2020-05-17 | Disposition: A | Discharge status: Home / Self Care | Payer: PPO | Source: Ambulatory Visit | Attending: Geriatric Medicine | Admitting: Geriatric Medicine

## 2020-05-17 ENCOUNTER — Other Ambulatory Visit: Payer: Self-pay | Admitting: Geriatric Medicine

## 2020-05-17 DIAGNOSIS — E7800 Pure hypercholesterolemia, unspecified: Secondary | ICD-10-CM | POA: Diagnosis not present

## 2020-05-17 DIAGNOSIS — F325 Major depressive disorder, single episode, in full remission: Secondary | ICD-10-CM | POA: Diagnosis not present

## 2020-05-17 DIAGNOSIS — E1140 Type 2 diabetes mellitus with diabetic neuropathy, unspecified: Secondary | ICD-10-CM | POA: Diagnosis not present

## 2020-05-17 DIAGNOSIS — Z79899 Other long term (current) drug therapy: Secondary | ICD-10-CM | POA: Diagnosis not present

## 2020-05-17 DIAGNOSIS — R059 Cough, unspecified: Secondary | ICD-10-CM

## 2020-05-17 DIAGNOSIS — E113299 Type 2 diabetes mellitus with mild nonproliferative diabetic retinopathy without macular edema, unspecified eye: Secondary | ICD-10-CM | POA: Diagnosis not present

## 2020-05-17 DIAGNOSIS — N1831 Chronic kidney disease, stage 3a: Secondary | ICD-10-CM | POA: Diagnosis not present

## 2020-05-17 DIAGNOSIS — I10 Essential (primary) hypertension: Secondary | ICD-10-CM | POA: Diagnosis not present

## 2020-05-17 DIAGNOSIS — Z0000 Encounter for general adult medical examination without abnormal findings: Secondary | ICD-10-CM | POA: Diagnosis not present

## 2020-05-17 DIAGNOSIS — D696 Thrombocytopenia, unspecified: Secondary | ICD-10-CM | POA: Diagnosis not present

## 2020-05-17 DIAGNOSIS — G4733 Obstructive sleep apnea (adult) (pediatric): Secondary | ICD-10-CM | POA: Diagnosis not present

## 2020-05-17 DIAGNOSIS — D649 Anemia, unspecified: Secondary | ICD-10-CM | POA: Diagnosis not present

## 2020-05-17 DIAGNOSIS — I129 Hypertensive chronic kidney disease with stage 1 through stage 4 chronic kidney disease, or unspecified chronic kidney disease: Secondary | ICD-10-CM | POA: Diagnosis not present

## 2020-05-17 DIAGNOSIS — E1129 Type 2 diabetes mellitus with other diabetic kidney complication: Secondary | ICD-10-CM | POA: Diagnosis not present

## 2020-06-05 DIAGNOSIS — E7800 Pure hypercholesterolemia, unspecified: Secondary | ICD-10-CM | POA: Diagnosis not present

## 2020-06-05 DIAGNOSIS — F325 Major depressive disorder, single episode, in full remission: Secondary | ICD-10-CM | POA: Diagnosis not present

## 2020-06-05 DIAGNOSIS — E1140 Type 2 diabetes mellitus with diabetic neuropathy, unspecified: Secondary | ICD-10-CM | POA: Diagnosis not present

## 2020-06-05 DIAGNOSIS — N1831 Chronic kidney disease, stage 3a: Secondary | ICD-10-CM | POA: Diagnosis not present

## 2020-06-05 DIAGNOSIS — I10 Essential (primary) hypertension: Secondary | ICD-10-CM | POA: Diagnosis not present

## 2020-06-05 DIAGNOSIS — E1121 Type 2 diabetes mellitus with diabetic nephropathy: Secondary | ICD-10-CM | POA: Diagnosis not present

## 2020-06-05 DIAGNOSIS — E1129 Type 2 diabetes mellitus with other diabetic kidney complication: Secondary | ICD-10-CM | POA: Diagnosis not present

## 2020-06-05 DIAGNOSIS — I129 Hypertensive chronic kidney disease with stage 1 through stage 4 chronic kidney disease, or unspecified chronic kidney disease: Secondary | ICD-10-CM | POA: Diagnosis not present

## 2020-06-12 ENCOUNTER — Ambulatory Visit: Payer: PPO | Admitting: Neurology

## 2020-06-19 DIAGNOSIS — I10 Essential (primary) hypertension: Secondary | ICD-10-CM | POA: Diagnosis not present

## 2020-06-19 DIAGNOSIS — E1140 Type 2 diabetes mellitus with diabetic neuropathy, unspecified: Secondary | ICD-10-CM | POA: Diagnosis not present

## 2020-06-19 DIAGNOSIS — I129 Hypertensive chronic kidney disease with stage 1 through stage 4 chronic kidney disease, or unspecified chronic kidney disease: Secondary | ICD-10-CM | POA: Diagnosis not present

## 2020-06-19 DIAGNOSIS — E1129 Type 2 diabetes mellitus with other diabetic kidney complication: Secondary | ICD-10-CM | POA: Diagnosis not present

## 2020-06-19 DIAGNOSIS — E7800 Pure hypercholesterolemia, unspecified: Secondary | ICD-10-CM | POA: Diagnosis not present

## 2020-06-19 DIAGNOSIS — F325 Major depressive disorder, single episode, in full remission: Secondary | ICD-10-CM | POA: Diagnosis not present

## 2020-06-19 DIAGNOSIS — E1121 Type 2 diabetes mellitus with diabetic nephropathy: Secondary | ICD-10-CM | POA: Diagnosis not present

## 2020-06-19 DIAGNOSIS — N1831 Chronic kidney disease, stage 3a: Secondary | ICD-10-CM | POA: Diagnosis not present

## 2020-07-02 DIAGNOSIS — D0439 Carcinoma in situ of skin of other parts of face: Secondary | ICD-10-CM | POA: Diagnosis not present

## 2020-07-02 DIAGNOSIS — Z85828 Personal history of other malignant neoplasm of skin: Secondary | ICD-10-CM | POA: Diagnosis not present

## 2020-07-02 DIAGNOSIS — L821 Other seborrheic keratosis: Secondary | ICD-10-CM | POA: Diagnosis not present

## 2020-07-02 DIAGNOSIS — L570 Actinic keratosis: Secondary | ICD-10-CM | POA: Diagnosis not present

## 2020-07-05 DIAGNOSIS — I10 Essential (primary) hypertension: Secondary | ICD-10-CM | POA: Diagnosis not present

## 2020-07-10 DIAGNOSIS — I129 Hypertensive chronic kidney disease with stage 1 through stage 4 chronic kidney disease, or unspecified chronic kidney disease: Secondary | ICD-10-CM | POA: Diagnosis not present

## 2020-07-10 DIAGNOSIS — E1129 Type 2 diabetes mellitus with other diabetic kidney complication: Secondary | ICD-10-CM | POA: Diagnosis not present

## 2020-07-10 DIAGNOSIS — E1140 Type 2 diabetes mellitus with diabetic neuropathy, unspecified: Secondary | ICD-10-CM | POA: Diagnosis not present

## 2020-07-10 DIAGNOSIS — I10 Essential (primary) hypertension: Secondary | ICD-10-CM | POA: Diagnosis not present

## 2020-07-10 DIAGNOSIS — N1831 Chronic kidney disease, stage 3a: Secondary | ICD-10-CM | POA: Diagnosis not present

## 2020-07-10 DIAGNOSIS — E7800 Pure hypercholesterolemia, unspecified: Secondary | ICD-10-CM | POA: Diagnosis not present

## 2020-07-10 DIAGNOSIS — E1121 Type 2 diabetes mellitus with diabetic nephropathy: Secondary | ICD-10-CM | POA: Diagnosis not present

## 2020-07-10 DIAGNOSIS — F325 Major depressive disorder, single episode, in full remission: Secondary | ICD-10-CM | POA: Diagnosis not present

## 2020-07-19 DIAGNOSIS — Z114 Encounter for screening for human immunodeficiency virus [HIV]: Secondary | ICD-10-CM | POA: Diagnosis not present

## 2020-07-19 DIAGNOSIS — B180 Chronic viral hepatitis B with delta-agent: Secondary | ICD-10-CM | POA: Diagnosis not present

## 2020-07-19 DIAGNOSIS — Z21 Asymptomatic human immunodeficiency virus [HIV] infection status: Secondary | ICD-10-CM | POA: Diagnosis not present

## 2020-07-19 DIAGNOSIS — Z1159 Encounter for screening for other viral diseases: Secondary | ICD-10-CM | POA: Diagnosis not present

## 2020-07-25 DIAGNOSIS — E1149 Type 2 diabetes mellitus with other diabetic neurological complication: Secondary | ICD-10-CM | POA: Diagnosis not present

## 2020-07-25 DIAGNOSIS — G3184 Mild cognitive impairment, so stated: Secondary | ICD-10-CM | POA: Diagnosis not present

## 2020-07-25 DIAGNOSIS — I10 Essential (primary) hypertension: Secondary | ICD-10-CM | POA: Diagnosis not present

## 2020-07-25 DIAGNOSIS — R41841 Cognitive communication deficit: Secondary | ICD-10-CM | POA: Diagnosis not present

## 2020-07-26 DIAGNOSIS — Z1159 Encounter for screening for other viral diseases: Secondary | ICD-10-CM | POA: Diagnosis not present

## 2020-08-05 DIAGNOSIS — I10 Essential (primary) hypertension: Secondary | ICD-10-CM | POA: Diagnosis not present

## 2020-08-06 DIAGNOSIS — I10 Essential (primary) hypertension: Secondary | ICD-10-CM | POA: Diagnosis not present

## 2020-08-09 DIAGNOSIS — E1140 Type 2 diabetes mellitus with diabetic neuropathy, unspecified: Secondary | ICD-10-CM | POA: Diagnosis not present

## 2020-08-09 DIAGNOSIS — R41841 Cognitive communication deficit: Secondary | ICD-10-CM | POA: Diagnosis not present

## 2020-08-09 DIAGNOSIS — E1149 Type 2 diabetes mellitus with other diabetic neurological complication: Secondary | ICD-10-CM | POA: Diagnosis not present

## 2020-08-09 DIAGNOSIS — F325 Major depressive disorder, single episode, in full remission: Secondary | ICD-10-CM | POA: Diagnosis not present

## 2020-08-09 DIAGNOSIS — I10 Essential (primary) hypertension: Secondary | ICD-10-CM | POA: Diagnosis not present

## 2020-08-09 DIAGNOSIS — I129 Hypertensive chronic kidney disease with stage 1 through stage 4 chronic kidney disease, or unspecified chronic kidney disease: Secondary | ICD-10-CM | POA: Diagnosis not present

## 2020-08-09 DIAGNOSIS — E1129 Type 2 diabetes mellitus with other diabetic kidney complication: Secondary | ICD-10-CM | POA: Diagnosis not present

## 2020-08-09 DIAGNOSIS — E1121 Type 2 diabetes mellitus with diabetic nephropathy: Secondary | ICD-10-CM | POA: Diagnosis not present

## 2020-08-09 DIAGNOSIS — E7800 Pure hypercholesterolemia, unspecified: Secondary | ICD-10-CM | POA: Diagnosis not present

## 2020-08-09 DIAGNOSIS — G3184 Mild cognitive impairment, so stated: Secondary | ICD-10-CM | POA: Diagnosis not present

## 2020-08-09 DIAGNOSIS — N1831 Chronic kidney disease, stage 3a: Secondary | ICD-10-CM | POA: Diagnosis not present

## 2020-08-14 DIAGNOSIS — I10 Essential (primary) hypertension: Secondary | ICD-10-CM | POA: Diagnosis not present

## 2020-08-14 DIAGNOSIS — E1149 Type 2 diabetes mellitus with other diabetic neurological complication: Secondary | ICD-10-CM | POA: Diagnosis not present

## 2020-08-14 DIAGNOSIS — G3184 Mild cognitive impairment, so stated: Secondary | ICD-10-CM | POA: Diagnosis not present

## 2020-08-14 DIAGNOSIS — R41841 Cognitive communication deficit: Secondary | ICD-10-CM | POA: Diagnosis not present

## 2020-08-16 DIAGNOSIS — E1149 Type 2 diabetes mellitus with other diabetic neurological complication: Secondary | ICD-10-CM | POA: Diagnosis not present

## 2020-08-16 DIAGNOSIS — I10 Essential (primary) hypertension: Secondary | ICD-10-CM | POA: Diagnosis not present

## 2020-08-16 DIAGNOSIS — R41841 Cognitive communication deficit: Secondary | ICD-10-CM | POA: Diagnosis not present

## 2020-08-16 DIAGNOSIS — G3184 Mild cognitive impairment, so stated: Secondary | ICD-10-CM | POA: Diagnosis not present

## 2020-08-21 DIAGNOSIS — E1149 Type 2 diabetes mellitus with other diabetic neurological complication: Secondary | ICD-10-CM | POA: Diagnosis not present

## 2020-08-21 DIAGNOSIS — I10 Essential (primary) hypertension: Secondary | ICD-10-CM | POA: Diagnosis not present

## 2020-08-21 DIAGNOSIS — R41841 Cognitive communication deficit: Secondary | ICD-10-CM | POA: Diagnosis not present

## 2020-08-21 DIAGNOSIS — G3184 Mild cognitive impairment, so stated: Secondary | ICD-10-CM | POA: Diagnosis not present

## 2020-08-22 DIAGNOSIS — F325 Major depressive disorder, single episode, in full remission: Secondary | ICD-10-CM | POA: Diagnosis not present

## 2020-08-22 DIAGNOSIS — R634 Abnormal weight loss: Secondary | ICD-10-CM | POA: Diagnosis not present

## 2020-08-22 DIAGNOSIS — E1140 Type 2 diabetes mellitus with diabetic neuropathy, unspecified: Secondary | ICD-10-CM | POA: Diagnosis not present

## 2020-08-22 DIAGNOSIS — N1831 Chronic kidney disease, stage 3a: Secondary | ICD-10-CM | POA: Diagnosis not present

## 2020-08-22 DIAGNOSIS — I129 Hypertensive chronic kidney disease with stage 1 through stage 4 chronic kidney disease, or unspecified chronic kidney disease: Secondary | ICD-10-CM | POA: Diagnosis not present

## 2020-08-22 DIAGNOSIS — Z7984 Long term (current) use of oral hypoglycemic drugs: Secondary | ICD-10-CM | POA: Diagnosis not present

## 2020-08-27 DIAGNOSIS — R41841 Cognitive communication deficit: Secondary | ICD-10-CM | POA: Diagnosis not present

## 2020-08-27 DIAGNOSIS — E1149 Type 2 diabetes mellitus with other diabetic neurological complication: Secondary | ICD-10-CM | POA: Diagnosis not present

## 2020-08-27 DIAGNOSIS — I10 Essential (primary) hypertension: Secondary | ICD-10-CM | POA: Diagnosis not present

## 2020-08-27 DIAGNOSIS — G3184 Mild cognitive impairment, so stated: Secondary | ICD-10-CM | POA: Diagnosis not present

## 2020-08-31 DIAGNOSIS — G3184 Mild cognitive impairment, so stated: Secondary | ICD-10-CM | POA: Diagnosis not present

## 2020-08-31 DIAGNOSIS — I10 Essential (primary) hypertension: Secondary | ICD-10-CM | POA: Diagnosis not present

## 2020-08-31 DIAGNOSIS — E1149 Type 2 diabetes mellitus with other diabetic neurological complication: Secondary | ICD-10-CM | POA: Diagnosis not present

## 2020-08-31 DIAGNOSIS — R41841 Cognitive communication deficit: Secondary | ICD-10-CM | POA: Diagnosis not present

## 2020-09-03 DIAGNOSIS — I10 Essential (primary) hypertension: Secondary | ICD-10-CM | POA: Diagnosis not present

## 2020-09-05 DIAGNOSIS — I10 Essential (primary) hypertension: Secondary | ICD-10-CM | POA: Diagnosis not present

## 2020-09-05 DIAGNOSIS — E1149 Type 2 diabetes mellitus with other diabetic neurological complication: Secondary | ICD-10-CM | POA: Diagnosis not present

## 2020-09-05 DIAGNOSIS — R41841 Cognitive communication deficit: Secondary | ICD-10-CM | POA: Diagnosis not present

## 2020-09-05 DIAGNOSIS — G3184 Mild cognitive impairment, so stated: Secondary | ICD-10-CM | POA: Diagnosis not present

## 2020-09-11 DIAGNOSIS — R41841 Cognitive communication deficit: Secondary | ICD-10-CM | POA: Diagnosis not present

## 2020-09-11 DIAGNOSIS — E1149 Type 2 diabetes mellitus with other diabetic neurological complication: Secondary | ICD-10-CM | POA: Diagnosis not present

## 2020-09-11 DIAGNOSIS — G3184 Mild cognitive impairment, so stated: Secondary | ICD-10-CM | POA: Diagnosis not present

## 2020-09-11 DIAGNOSIS — I10 Essential (primary) hypertension: Secondary | ICD-10-CM | POA: Diagnosis not present

## 2020-09-18 DIAGNOSIS — G3184 Mild cognitive impairment, so stated: Secondary | ICD-10-CM | POA: Diagnosis not present

## 2020-09-18 DIAGNOSIS — E1149 Type 2 diabetes mellitus with other diabetic neurological complication: Secondary | ICD-10-CM | POA: Diagnosis not present

## 2020-09-18 DIAGNOSIS — I10 Essential (primary) hypertension: Secondary | ICD-10-CM | POA: Diagnosis not present

## 2020-09-18 DIAGNOSIS — R41841 Cognitive communication deficit: Secondary | ICD-10-CM | POA: Diagnosis not present

## 2020-10-02 DIAGNOSIS — E7800 Pure hypercholesterolemia, unspecified: Secondary | ICD-10-CM | POA: Diagnosis not present

## 2020-10-02 DIAGNOSIS — E1140 Type 2 diabetes mellitus with diabetic neuropathy, unspecified: Secondary | ICD-10-CM | POA: Diagnosis not present

## 2020-10-02 DIAGNOSIS — I129 Hypertensive chronic kidney disease with stage 1 through stage 4 chronic kidney disease, or unspecified chronic kidney disease: Secondary | ICD-10-CM | POA: Diagnosis not present

## 2020-10-02 DIAGNOSIS — I10 Essential (primary) hypertension: Secondary | ICD-10-CM | POA: Diagnosis not present

## 2020-10-02 DIAGNOSIS — N1831 Chronic kidney disease, stage 3a: Secondary | ICD-10-CM | POA: Diagnosis not present

## 2020-10-02 DIAGNOSIS — F325 Major depressive disorder, single episode, in full remission: Secondary | ICD-10-CM | POA: Diagnosis not present

## 2020-10-02 DIAGNOSIS — E1121 Type 2 diabetes mellitus with diabetic nephropathy: Secondary | ICD-10-CM | POA: Diagnosis not present

## 2020-10-02 DIAGNOSIS — E1129 Type 2 diabetes mellitus with other diabetic kidney complication: Secondary | ICD-10-CM | POA: Diagnosis not present

## 2020-10-04 DIAGNOSIS — I10 Essential (primary) hypertension: Secondary | ICD-10-CM | POA: Diagnosis not present

## 2020-10-08 DIAGNOSIS — E1129 Type 2 diabetes mellitus with other diabetic kidney complication: Secondary | ICD-10-CM | POA: Diagnosis not present

## 2020-10-08 DIAGNOSIS — E7800 Pure hypercholesterolemia, unspecified: Secondary | ICD-10-CM | POA: Diagnosis not present

## 2020-10-08 DIAGNOSIS — E1121 Type 2 diabetes mellitus with diabetic nephropathy: Secondary | ICD-10-CM | POA: Diagnosis not present

## 2020-10-08 DIAGNOSIS — I129 Hypertensive chronic kidney disease with stage 1 through stage 4 chronic kidney disease, or unspecified chronic kidney disease: Secondary | ICD-10-CM | POA: Diagnosis not present

## 2020-10-08 DIAGNOSIS — N1831 Chronic kidney disease, stage 3a: Secondary | ICD-10-CM | POA: Diagnosis not present

## 2020-10-08 DIAGNOSIS — F325 Major depressive disorder, single episode, in full remission: Secondary | ICD-10-CM | POA: Diagnosis not present

## 2020-10-08 DIAGNOSIS — I10 Essential (primary) hypertension: Secondary | ICD-10-CM | POA: Diagnosis not present

## 2020-11-02 DIAGNOSIS — I10 Essential (primary) hypertension: Secondary | ICD-10-CM | POA: Diagnosis not present

## 2020-11-06 DIAGNOSIS — E1121 Type 2 diabetes mellitus with diabetic nephropathy: Secondary | ICD-10-CM | POA: Diagnosis not present

## 2020-11-06 DIAGNOSIS — I10 Essential (primary) hypertension: Secondary | ICD-10-CM | POA: Diagnosis not present

## 2020-11-06 DIAGNOSIS — E1129 Type 2 diabetes mellitus with other diabetic kidney complication: Secondary | ICD-10-CM | POA: Diagnosis not present

## 2020-11-06 DIAGNOSIS — E1140 Type 2 diabetes mellitus with diabetic neuropathy, unspecified: Secondary | ICD-10-CM | POA: Diagnosis not present

## 2020-11-06 DIAGNOSIS — E7800 Pure hypercholesterolemia, unspecified: Secondary | ICD-10-CM | POA: Diagnosis not present

## 2020-11-06 DIAGNOSIS — I129 Hypertensive chronic kidney disease with stage 1 through stage 4 chronic kidney disease, or unspecified chronic kidney disease: Secondary | ICD-10-CM | POA: Diagnosis not present

## 2020-11-06 DIAGNOSIS — F325 Major depressive disorder, single episode, in full remission: Secondary | ICD-10-CM | POA: Diagnosis not present

## 2020-11-06 DIAGNOSIS — N1831 Chronic kidney disease, stage 3a: Secondary | ICD-10-CM | POA: Diagnosis not present

## 2020-12-04 DIAGNOSIS — I10 Essential (primary) hypertension: Secondary | ICD-10-CM | POA: Diagnosis not present

## 2020-12-06 NOTE — Progress Notes (Deleted)
NEUROLOGY FOLLOW UP OFFICE NOTE  [name] [id number]  Assessment/Plan:   1.  Memory deficits  Subjective:  [name] is an 85 year old right-handed male with HTN, CAD, HLD, sick sinus syndrome, type 2 diabetes and MCI who presents for worsening memory.  ***  PAST MEDICAL HISTORY: Past Medical History:  Diagnosis Date  . CAD (coronary artery disease)    Bare meta stent RCA  . Carotid stenosis    a. Carotid US 3/17: bilateral ICA 1-39% >> repeat 2 years  . Diabetes mellitus ([place])   . History of nuclear stress test    Myoview 12/16: EF 66%, very mild inferior ischemia; Low Risk  . HTN (hypertension)   . Hyperlipidemia   . Peripheral neuropathy     MEDICATIONS: Current Outpatient Medications on File Prior to Visit  Medication Sig Dispense Refill  . allopurinol (ZYLOPRIM) 300 MG tablet Take 300 mg by mouth daily.     Marland Kitchen amLODipine (NORVASC) 10 MG tablet TAKE 1 TABLET ONCE DAILY. 30 tablet 11  . aspirin 81 MG tablet Take 1 tablet (81 mg total) by mouth daily. 30 tablet 6  . BENICAR 20 MG tablet Take 20 mg by mouth daily.     . calcium-vitamin D (OSCAL WITH D) 500-200 MG-UNIT per tablet Take 1 tablet by mouth daily. Vitamin D 3  daily    . citalopram (CELEXA) 40 MG tablet Take 40 mg by mouth daily.     . Coenzyme Q10 (CO Q 10 PO) Take by mouth. 1 tab daily    . CRESTOR 20 MG tablet Take 10 mg by mouth daily.     . diazepam (VALIUM) 5 MG tablet Take 5 mg by mouth daily as needed for anxiety.     . diphenoxylate-atropine (LOMOTIL) 2.5-0.025 MG per tablet Take 1 tablet by mouth 4 (four) times daily as needed for diarrhea or loose stools.    . famotidine (PEPCID) 20 MG tablet Take 20 mg by mouth 2 (two) times daily.    . fluticasone (FLONASE) 50 MCG/ACT nasal spray Place 2 sprays into both nostrils as needed for allergies or rhinitis.    . hydrochlorothiazide (HYDRODIURIL) 25 MG tablet TAKE 1 TABLET ONCE DAILY. 90 tablet 3  . ipratropium-albuterol (DUONEB) 0.5-2.5  (3) MG/3ML SOLN Inhale 3 mLs into the lungs every 4 (four) hours as needed.    Marland Kitchen JANUVIA 100 MG tablet Take 100 mg by mouth daily.    Marland Kitchen LANTUS SOLOSTAR 100 UNIT/ML injection Inject 56-58 Units into the skin every morning.     . metFORMIN (GLUCOPHAGE-XR) 500 MG 24 hr tablet Take 500 mg by mouth daily.    . montelukast (SINGULAIR) 10 MG tablet Take 10 mg by mouth daily.    . nabumetone (RELAFEN) 500 MG tablet Take 500 mg by mouth 2 (two) times daily as needed for mild pain.     . nitroGLYCERIN (NITROSTAT) 0.4 MG SL tablet PLACE 1 TABLET UNDER THE TONGUE EVERY 5 MINUTES AS NEEDED FOR CHEST PAIN. 25 tablet 5  . Saline (SIMPLY SALINE) 0.9 % AERS Place into the nose.    . spironolactone (ALDACTONE) 25 MG tablet Take 25 mg by mouth daily.    . vitamin B-12 (CYANOCOBALAMIN) 500 MCG tablet Take 500 mcg by mouth daily.    Marland Kitchen zolpidem (AMBIEN) 10 MG tablet Take 5 mg by mouth at bedtime. For sleep     No current facility-administered medications on file prior to visit.    ALLERGIES: Allergies  Allergen Reactions  . Ace Inhibitors   . Alpha Lipoic Acid [Lipoic Acid]     Upset Stomach  . Invokana [Canagliflozin]   . Heparin Hives  . Isosorbide Mononitrate [Isosorbide Dinitrate Er] Hives  . Penicillins Hives  . Sulfonamide Derivatives Hives    FAMILY HISTORY: Family History  Problem Relation Age of Onset  . Heart failure Mother   . Stroke Mother   . Heart attack Father 11      Objective:  *** General: No acute distress.  Patient appears ***-groomed.   Head:  Normocephalic/atraumatic Eyes:  Fundi examined but not visualized Neck: supple, no paraspinal tenderness, full range of motion Heart:  Regular rate and rhythm Lungs:  Clear to auscultation bilaterally Back: No paraspinal tenderness Neurological Exam: alert and oriented to person, place, and time. Attention span and concentration intact, recent and remote memory intact, fund of knowledge intact.  Speech fluent and not dysarthric,  language intact.  CN II-XII intact. Bulk and tone normal, muscle strength 5/5 throughout.  Sensation to light touch, temperature and vibration intact.  Deep tendon reflexes 2+ throughout, toes downgoing.  Finger to nose and heel to shin testing intact.  Gait normal, Romberg negative.     [name], DO  CC: ***

## 2020-12-07 ENCOUNTER — Ambulatory Visit: Payer: PPO | Admitting: Neurology

## 2020-12-19 DIAGNOSIS — M7062 Trochanteric bursitis, left hip: Secondary | ICD-10-CM | POA: Diagnosis not present

## 2020-12-25 DIAGNOSIS — M25552 Pain in left hip: Secondary | ICD-10-CM | POA: Diagnosis not present

## 2020-12-31 DIAGNOSIS — M25452 Effusion, left hip: Secondary | ICD-10-CM | POA: Diagnosis not present

## 2020-12-31 DIAGNOSIS — M25552 Pain in left hip: Secondary | ICD-10-CM | POA: Diagnosis not present

## 2020-12-31 DIAGNOSIS — M6281 Muscle weakness (generalized): Secondary | ICD-10-CM | POA: Diagnosis not present

## 2020-12-31 DIAGNOSIS — M25652 Stiffness of left hip, not elsewhere classified: Secondary | ICD-10-CM | POA: Diagnosis not present

## 2021-01-02 DIAGNOSIS — M25552 Pain in left hip: Secondary | ICD-10-CM | POA: Diagnosis not present

## 2021-01-02 DIAGNOSIS — M25652 Stiffness of left hip, not elsewhere classified: Secondary | ICD-10-CM | POA: Diagnosis not present

## 2021-01-02 DIAGNOSIS — M6281 Muscle weakness (generalized): Secondary | ICD-10-CM | POA: Diagnosis not present

## 2021-01-02 DIAGNOSIS — M25452 Effusion, left hip: Secondary | ICD-10-CM | POA: Diagnosis not present

## 2021-01-03 DIAGNOSIS — E1129 Type 2 diabetes mellitus with other diabetic kidney complication: Secondary | ICD-10-CM | POA: Diagnosis not present

## 2021-01-03 DIAGNOSIS — F325 Major depressive disorder, single episode, in full remission: Secondary | ICD-10-CM | POA: Diagnosis not present

## 2021-01-03 DIAGNOSIS — I10 Essential (primary) hypertension: Secondary | ICD-10-CM | POA: Diagnosis not present

## 2021-01-03 DIAGNOSIS — E1121 Type 2 diabetes mellitus with diabetic nephropathy: Secondary | ICD-10-CM | POA: Diagnosis not present

## 2021-01-03 DIAGNOSIS — I129 Hypertensive chronic kidney disease with stage 1 through stage 4 chronic kidney disease, or unspecified chronic kidney disease: Secondary | ICD-10-CM | POA: Diagnosis not present

## 2021-01-03 DIAGNOSIS — N1831 Chronic kidney disease, stage 3a: Secondary | ICD-10-CM | POA: Diagnosis not present

## 2021-01-03 DIAGNOSIS — E7800 Pure hypercholesterolemia, unspecified: Secondary | ICD-10-CM | POA: Diagnosis not present

## 2021-01-03 DIAGNOSIS — E1140 Type 2 diabetes mellitus with diabetic neuropathy, unspecified: Secondary | ICD-10-CM | POA: Diagnosis not present

## 2021-01-09 DIAGNOSIS — M25452 Effusion, left hip: Secondary | ICD-10-CM | POA: Diagnosis not present

## 2021-01-09 DIAGNOSIS — M25552 Pain in left hip: Secondary | ICD-10-CM | POA: Diagnosis not present

## 2021-01-09 DIAGNOSIS — M25652 Stiffness of left hip, not elsewhere classified: Secondary | ICD-10-CM | POA: Diagnosis not present

## 2021-01-09 DIAGNOSIS — M6281 Muscle weakness (generalized): Secondary | ICD-10-CM | POA: Diagnosis not present

## 2021-01-14 DIAGNOSIS — M25652 Stiffness of left hip, not elsewhere classified: Secondary | ICD-10-CM | POA: Diagnosis not present

## 2021-01-14 DIAGNOSIS — M25452 Effusion, left hip: Secondary | ICD-10-CM | POA: Diagnosis not present

## 2021-01-14 DIAGNOSIS — M25552 Pain in left hip: Secondary | ICD-10-CM | POA: Diagnosis not present

## 2021-01-14 DIAGNOSIS — M6281 Muscle weakness (generalized): Secondary | ICD-10-CM | POA: Diagnosis not present

## 2021-01-18 DIAGNOSIS — M25452 Effusion, left hip: Secondary | ICD-10-CM | POA: Diagnosis not present

## 2021-01-18 DIAGNOSIS — M25652 Stiffness of left hip, not elsewhere classified: Secondary | ICD-10-CM | POA: Diagnosis not present

## 2021-01-18 DIAGNOSIS — M25552 Pain in left hip: Secondary | ICD-10-CM | POA: Diagnosis not present

## 2021-01-18 DIAGNOSIS — M6281 Muscle weakness (generalized): Secondary | ICD-10-CM | POA: Diagnosis not present

## 2021-01-21 DIAGNOSIS — R079 Chest pain, unspecified: Secondary | ICD-10-CM | POA: Diagnosis not present

## 2021-01-23 DIAGNOSIS — M6281 Muscle weakness (generalized): Secondary | ICD-10-CM | POA: Diagnosis not present

## 2021-01-23 DIAGNOSIS — M25452 Effusion, left hip: Secondary | ICD-10-CM | POA: Diagnosis not present

## 2021-01-23 DIAGNOSIS — M25552 Pain in left hip: Secondary | ICD-10-CM | POA: Diagnosis not present

## 2021-01-23 DIAGNOSIS — M25652 Stiffness of left hip, not elsewhere classified: Secondary | ICD-10-CM | POA: Diagnosis not present

## 2021-01-28 DIAGNOSIS — M25652 Stiffness of left hip, not elsewhere classified: Secondary | ICD-10-CM | POA: Diagnosis not present

## 2021-01-28 DIAGNOSIS — M25452 Effusion, left hip: Secondary | ICD-10-CM | POA: Diagnosis not present

## 2021-01-28 DIAGNOSIS — M25552 Pain in left hip: Secondary | ICD-10-CM | POA: Diagnosis not present

## 2021-01-28 DIAGNOSIS — M6281 Muscle weakness (generalized): Secondary | ICD-10-CM | POA: Diagnosis not present

## 2021-01-30 DIAGNOSIS — M25452 Effusion, left hip: Secondary | ICD-10-CM | POA: Diagnosis not present

## 2021-01-30 DIAGNOSIS — M25652 Stiffness of left hip, not elsewhere classified: Secondary | ICD-10-CM | POA: Diagnosis not present

## 2021-01-30 DIAGNOSIS — M25552 Pain in left hip: Secondary | ICD-10-CM | POA: Diagnosis not present

## 2021-01-30 DIAGNOSIS — M6281 Muscle weakness (generalized): Secondary | ICD-10-CM | POA: Diagnosis not present

## 2021-02-04 DIAGNOSIS — M6281 Muscle weakness (generalized): Secondary | ICD-10-CM | POA: Diagnosis not present

## 2021-02-04 DIAGNOSIS — M25552 Pain in left hip: Secondary | ICD-10-CM | POA: Diagnosis not present

## 2021-02-04 DIAGNOSIS — M25452 Effusion, left hip: Secondary | ICD-10-CM | POA: Diagnosis not present

## 2021-02-04 DIAGNOSIS — M25652 Stiffness of left hip, not elsewhere classified: Secondary | ICD-10-CM | POA: Diagnosis not present

## 2021-02-06 DIAGNOSIS — M6281 Muscle weakness (generalized): Secondary | ICD-10-CM | POA: Diagnosis not present

## 2021-02-06 DIAGNOSIS — M25452 Effusion, left hip: Secondary | ICD-10-CM | POA: Diagnosis not present

## 2021-02-06 DIAGNOSIS — M25552 Pain in left hip: Secondary | ICD-10-CM | POA: Diagnosis not present

## 2021-02-06 DIAGNOSIS — M25652 Stiffness of left hip, not elsewhere classified: Secondary | ICD-10-CM | POA: Diagnosis not present

## 2021-02-12 DIAGNOSIS — M25452 Effusion, left hip: Secondary | ICD-10-CM | POA: Diagnosis not present

## 2021-02-12 DIAGNOSIS — M25552 Pain in left hip: Secondary | ICD-10-CM | POA: Diagnosis not present

## 2021-02-12 DIAGNOSIS — M25652 Stiffness of left hip, not elsewhere classified: Secondary | ICD-10-CM | POA: Diagnosis not present

## 2021-02-12 DIAGNOSIS — M6281 Muscle weakness (generalized): Secondary | ICD-10-CM | POA: Diagnosis not present

## 2021-02-20 DIAGNOSIS — M25652 Stiffness of left hip, not elsewhere classified: Secondary | ICD-10-CM | POA: Diagnosis not present

## 2021-02-20 DIAGNOSIS — M25452 Effusion, left hip: Secondary | ICD-10-CM | POA: Diagnosis not present

## 2021-02-20 DIAGNOSIS — M6281 Muscle weakness (generalized): Secondary | ICD-10-CM | POA: Diagnosis not present

## 2021-02-20 DIAGNOSIS — M25552 Pain in left hip: Secondary | ICD-10-CM | POA: Diagnosis not present

## 2021-02-21 DIAGNOSIS — M25652 Stiffness of left hip, not elsewhere classified: Secondary | ICD-10-CM | POA: Diagnosis not present

## 2021-02-21 DIAGNOSIS — M25552 Pain in left hip: Secondary | ICD-10-CM | POA: Diagnosis not present

## 2021-02-21 DIAGNOSIS — M25452 Effusion, left hip: Secondary | ICD-10-CM | POA: Diagnosis not present

## 2021-02-21 DIAGNOSIS — M6281 Muscle weakness (generalized): Secondary | ICD-10-CM | POA: Diagnosis not present

## 2021-02-26 DIAGNOSIS — M25452 Effusion, left hip: Secondary | ICD-10-CM | POA: Diagnosis not present

## 2021-02-26 DIAGNOSIS — M25652 Stiffness of left hip, not elsewhere classified: Secondary | ICD-10-CM | POA: Diagnosis not present

## 2021-02-26 DIAGNOSIS — M25552 Pain in left hip: Secondary | ICD-10-CM | POA: Diagnosis not present

## 2021-02-26 DIAGNOSIS — M6281 Muscle weakness (generalized): Secondary | ICD-10-CM | POA: Diagnosis not present

## 2021-03-01 DIAGNOSIS — M25452 Effusion, left hip: Secondary | ICD-10-CM | POA: Diagnosis not present

## 2021-03-01 DIAGNOSIS — M25652 Stiffness of left hip, not elsewhere classified: Secondary | ICD-10-CM | POA: Diagnosis not present

## 2021-03-01 DIAGNOSIS — M6281 Muscle weakness (generalized): Secondary | ICD-10-CM | POA: Diagnosis not present

## 2021-03-01 DIAGNOSIS — M25552 Pain in left hip: Secondary | ICD-10-CM | POA: Diagnosis not present

## 2021-03-05 DIAGNOSIS — M25652 Stiffness of left hip, not elsewhere classified: Secondary | ICD-10-CM | POA: Diagnosis not present

## 2021-03-05 DIAGNOSIS — M25452 Effusion, left hip: Secondary | ICD-10-CM | POA: Diagnosis not present

## 2021-03-05 DIAGNOSIS — M6281 Muscle weakness (generalized): Secondary | ICD-10-CM | POA: Diagnosis not present

## 2021-03-05 DIAGNOSIS — M25552 Pain in left hip: Secondary | ICD-10-CM | POA: Diagnosis not present

## 2021-03-06 DIAGNOSIS — I129 Hypertensive chronic kidney disease with stage 1 through stage 4 chronic kidney disease, or unspecified chronic kidney disease: Secondary | ICD-10-CM | POA: Diagnosis not present

## 2021-03-06 DIAGNOSIS — F325 Major depressive disorder, single episode, in full remission: Secondary | ICD-10-CM | POA: Diagnosis not present

## 2021-03-06 DIAGNOSIS — E1121 Type 2 diabetes mellitus with diabetic nephropathy: Secondary | ICD-10-CM | POA: Diagnosis not present

## 2021-03-06 DIAGNOSIS — E7800 Pure hypercholesterolemia, unspecified: Secondary | ICD-10-CM | POA: Diagnosis not present

## 2021-03-06 DIAGNOSIS — I10 Essential (primary) hypertension: Secondary | ICD-10-CM | POA: Diagnosis not present

## 2021-03-06 DIAGNOSIS — E1129 Type 2 diabetes mellitus with other diabetic kidney complication: Secondary | ICD-10-CM | POA: Diagnosis not present

## 2021-03-06 DIAGNOSIS — N1831 Chronic kidney disease, stage 3a: Secondary | ICD-10-CM | POA: Diagnosis not present

## 2021-03-07 DIAGNOSIS — M25452 Effusion, left hip: Secondary | ICD-10-CM | POA: Diagnosis not present

## 2021-03-07 DIAGNOSIS — M25552 Pain in left hip: Secondary | ICD-10-CM | POA: Diagnosis not present

## 2021-03-07 DIAGNOSIS — M6281 Muscle weakness (generalized): Secondary | ICD-10-CM | POA: Diagnosis not present

## 2021-03-07 DIAGNOSIS — M25652 Stiffness of left hip, not elsewhere classified: Secondary | ICD-10-CM | POA: Diagnosis not present

## 2021-03-14 DIAGNOSIS — M25552 Pain in left hip: Secondary | ICD-10-CM | POA: Diagnosis not present

## 2021-03-14 DIAGNOSIS — M25652 Stiffness of left hip, not elsewhere classified: Secondary | ICD-10-CM | POA: Diagnosis not present

## 2021-03-14 DIAGNOSIS — M25452 Effusion, left hip: Secondary | ICD-10-CM | POA: Diagnosis not present

## 2021-03-14 DIAGNOSIS — M6281 Muscle weakness (generalized): Secondary | ICD-10-CM | POA: Diagnosis not present

## 2021-04-02 DIAGNOSIS — E1140 Type 2 diabetes mellitus with diabetic neuropathy, unspecified: Secondary | ICD-10-CM | POA: Diagnosis not present

## 2021-04-02 DIAGNOSIS — E1129 Type 2 diabetes mellitus with other diabetic kidney complication: Secondary | ICD-10-CM | POA: Diagnosis not present

## 2021-04-02 DIAGNOSIS — N1831 Chronic kidney disease, stage 3a: Secondary | ICD-10-CM | POA: Diagnosis not present

## 2021-04-02 DIAGNOSIS — F325 Major depressive disorder, single episode, in full remission: Secondary | ICD-10-CM | POA: Diagnosis not present

## 2021-04-02 DIAGNOSIS — I10 Essential (primary) hypertension: Secondary | ICD-10-CM | POA: Diagnosis not present

## 2021-04-02 DIAGNOSIS — E1121 Type 2 diabetes mellitus with diabetic nephropathy: Secondary | ICD-10-CM | POA: Diagnosis not present

## 2021-04-02 DIAGNOSIS — E7800 Pure hypercholesterolemia, unspecified: Secondary | ICD-10-CM | POA: Diagnosis not present

## 2021-04-02 DIAGNOSIS — I129 Hypertensive chronic kidney disease with stage 1 through stage 4 chronic kidney disease, or unspecified chronic kidney disease: Secondary | ICD-10-CM | POA: Diagnosis not present

## 2021-05-02 DIAGNOSIS — N1831 Chronic kidney disease, stage 3a: Secondary | ICD-10-CM | POA: Diagnosis not present

## 2021-05-02 DIAGNOSIS — E1140 Type 2 diabetes mellitus with diabetic neuropathy, unspecified: Secondary | ICD-10-CM | POA: Diagnosis not present

## 2021-05-02 DIAGNOSIS — E1121 Type 2 diabetes mellitus with diabetic nephropathy: Secondary | ICD-10-CM | POA: Diagnosis not present

## 2021-05-02 DIAGNOSIS — E1129 Type 2 diabetes mellitus with other diabetic kidney complication: Secondary | ICD-10-CM | POA: Diagnosis not present

## 2021-05-02 DIAGNOSIS — F325 Major depressive disorder, single episode, in full remission: Secondary | ICD-10-CM | POA: Diagnosis not present

## 2021-05-02 DIAGNOSIS — E7800 Pure hypercholesterolemia, unspecified: Secondary | ICD-10-CM | POA: Diagnosis not present

## 2021-05-02 DIAGNOSIS — I10 Essential (primary) hypertension: Secondary | ICD-10-CM | POA: Diagnosis not present

## 2021-05-02 DIAGNOSIS — I129 Hypertensive chronic kidney disease with stage 1 through stage 4 chronic kidney disease, or unspecified chronic kidney disease: Secondary | ICD-10-CM | POA: Diagnosis not present

## 2021-06-03 ENCOUNTER — Other Ambulatory Visit: Payer: Self-pay | Admitting: Geriatric Medicine

## 2021-06-03 DIAGNOSIS — R1030 Lower abdominal pain, unspecified: Secondary | ICD-10-CM | POA: Diagnosis not present

## 2021-06-03 DIAGNOSIS — I129 Hypertensive chronic kidney disease with stage 1 through stage 4 chronic kidney disease, or unspecified chronic kidney disease: Secondary | ICD-10-CM | POA: Diagnosis not present

## 2021-06-03 DIAGNOSIS — R269 Unspecified abnormalities of gait and mobility: Secondary | ICD-10-CM | POA: Diagnosis not present

## 2021-06-03 DIAGNOSIS — N1831 Chronic kidney disease, stage 3a: Secondary | ICD-10-CM | POA: Diagnosis not present

## 2021-06-03 DIAGNOSIS — E1140 Type 2 diabetes mellitus with diabetic neuropathy, unspecified: Secondary | ICD-10-CM | POA: Diagnosis not present

## 2021-06-05 DIAGNOSIS — I129 Hypertensive chronic kidney disease with stage 1 through stage 4 chronic kidney disease, or unspecified chronic kidney disease: Secondary | ICD-10-CM | POA: Diagnosis not present

## 2021-06-05 DIAGNOSIS — F325 Major depressive disorder, single episode, in full remission: Secondary | ICD-10-CM | POA: Diagnosis not present

## 2021-06-05 DIAGNOSIS — E7800 Pure hypercholesterolemia, unspecified: Secondary | ICD-10-CM | POA: Diagnosis not present

## 2021-06-05 DIAGNOSIS — E1140 Type 2 diabetes mellitus with diabetic neuropathy, unspecified: Secondary | ICD-10-CM | POA: Diagnosis not present

## 2021-06-05 DIAGNOSIS — E1129 Type 2 diabetes mellitus with other diabetic kidney complication: Secondary | ICD-10-CM | POA: Diagnosis not present

## 2021-06-05 DIAGNOSIS — N1831 Chronic kidney disease, stage 3a: Secondary | ICD-10-CM | POA: Diagnosis not present

## 2021-06-05 DIAGNOSIS — E1121 Type 2 diabetes mellitus with diabetic nephropathy: Secondary | ICD-10-CM | POA: Diagnosis not present

## 2021-06-25 ENCOUNTER — Other Ambulatory Visit: Payer: Self-pay

## 2021-06-25 ENCOUNTER — Ambulatory Visit
Admission: RE | Admit: 2021-06-25 | Discharge: 2021-06-25 | Disposition: A | Discharge status: Home / Self Care | Payer: PPO | Source: Ambulatory Visit | Attending: Geriatric Medicine | Admitting: Geriatric Medicine

## 2021-06-25 DIAGNOSIS — N400 Enlarged prostate without lower urinary tract symptoms: Secondary | ICD-10-CM | POA: Diagnosis not present

## 2021-06-25 DIAGNOSIS — I700 Atherosclerosis of aorta: Secondary | ICD-10-CM | POA: Diagnosis not present

## 2021-06-25 DIAGNOSIS — N200 Calculus of kidney: Secondary | ICD-10-CM | POA: Diagnosis not present

## 2021-06-25 DIAGNOSIS — R1030 Lower abdominal pain, unspecified: Secondary | ICD-10-CM

## 2021-06-25 DIAGNOSIS — K5730 Diverticulosis of large intestine without perforation or abscess without bleeding: Secondary | ICD-10-CM | POA: Diagnosis not present

## 2021-06-25 MED ORDER — IOPAMIDOL (ISOVUE-300) INJECTION 61%
100.0000 mL | Freq: Once | INTRAVENOUS | Status: AC | PRN
  Administered 2021-06-25: 10:00:00 100 mL via INTRAVENOUS

## 2021-06-27 ENCOUNTER — Other Ambulatory Visit: Payer: Self-pay | Admitting: Geriatric Medicine

## 2021-06-27 DIAGNOSIS — K862 Cyst of pancreas: Secondary | ICD-10-CM

## 2021-07-03 DIAGNOSIS — L821 Other seborrheic keratosis: Secondary | ICD-10-CM | POA: Diagnosis not present

## 2021-07-03 DIAGNOSIS — L570 Actinic keratosis: Secondary | ICD-10-CM | POA: Diagnosis not present

## 2021-07-03 DIAGNOSIS — E7800 Pure hypercholesterolemia, unspecified: Secondary | ICD-10-CM | POA: Diagnosis not present

## 2021-07-03 DIAGNOSIS — N1831 Chronic kidney disease, stage 3a: Secondary | ICD-10-CM | POA: Diagnosis not present

## 2021-07-03 DIAGNOSIS — L814 Other melanin hyperpigmentation: Secondary | ICD-10-CM | POA: Diagnosis not present

## 2021-07-03 DIAGNOSIS — E1140 Type 2 diabetes mellitus with diabetic neuropathy, unspecified: Secondary | ICD-10-CM | POA: Diagnosis not present

## 2021-07-03 DIAGNOSIS — C44719 Basal cell carcinoma of skin of left lower limb, including hip: Secondary | ICD-10-CM | POA: Diagnosis not present

## 2021-07-03 DIAGNOSIS — Z85828 Personal history of other malignant neoplasm of skin: Secondary | ICD-10-CM | POA: Diagnosis not present

## 2021-07-03 DIAGNOSIS — D225 Melanocytic nevi of trunk: Secondary | ICD-10-CM | POA: Diagnosis not present

## 2021-07-03 DIAGNOSIS — E1121 Type 2 diabetes mellitus with diabetic nephropathy: Secondary | ICD-10-CM | POA: Diagnosis not present

## 2021-07-03 DIAGNOSIS — I129 Hypertensive chronic kidney disease with stage 1 through stage 4 chronic kidney disease, or unspecified chronic kidney disease: Secondary | ICD-10-CM | POA: Diagnosis not present

## 2021-07-03 DIAGNOSIS — D1801 Hemangioma of skin and subcutaneous tissue: Secondary | ICD-10-CM | POA: Diagnosis not present

## 2021-07-03 DIAGNOSIS — E1129 Type 2 diabetes mellitus with other diabetic kidney complication: Secondary | ICD-10-CM | POA: Diagnosis not present

## 2021-07-03 DIAGNOSIS — F325 Major depressive disorder, single episode, in full remission: Secondary | ICD-10-CM | POA: Diagnosis not present

## 2021-07-11 ENCOUNTER — Ambulatory Visit
Admission: RE | Admit: 2021-07-11 | Discharge: 2021-07-11 | Disposition: A | Discharge status: Home / Self Care | Payer: PPO | Source: Ambulatory Visit | Attending: Internal Medicine | Admitting: Internal Medicine

## 2021-07-11 ENCOUNTER — Other Ambulatory Visit: Payer: Self-pay | Admitting: Internal Medicine

## 2021-07-11 DIAGNOSIS — R058 Other specified cough: Secondary | ICD-10-CM

## 2021-07-11 DIAGNOSIS — I2510 Atherosclerotic heart disease of native coronary artery without angina pectoris: Secondary | ICD-10-CM | POA: Diagnosis not present

## 2021-07-11 DIAGNOSIS — M7062 Trochanteric bursitis, left hip: Secondary | ICD-10-CM | POA: Diagnosis not present

## 2021-07-11 DIAGNOSIS — R059 Cough, unspecified: Secondary | ICD-10-CM | POA: Diagnosis not present

## 2021-07-17 DIAGNOSIS — M25552 Pain in left hip: Secondary | ICD-10-CM | POA: Diagnosis not present

## 2021-07-23 ENCOUNTER — Other Ambulatory Visit: Payer: Self-pay | Admitting: Sports Medicine

## 2021-07-23 ENCOUNTER — Ambulatory Visit
Admission: RE | Admit: 2021-07-23 | Discharge: 2021-07-23 | Disposition: A | Discharge status: Home / Self Care | Payer: PPO | Source: Ambulatory Visit | Attending: Sports Medicine | Admitting: Sports Medicine

## 2021-07-23 DIAGNOSIS — M25552 Pain in left hip: Secondary | ICD-10-CM

## 2021-08-06 DIAGNOSIS — E1121 Type 2 diabetes mellitus with diabetic nephropathy: Secondary | ICD-10-CM | POA: Diagnosis not present

## 2021-08-06 DIAGNOSIS — E1140 Type 2 diabetes mellitus with diabetic neuropathy, unspecified: Secondary | ICD-10-CM | POA: Diagnosis not present

## 2021-08-06 DIAGNOSIS — N1831 Chronic kidney disease, stage 3a: Secondary | ICD-10-CM | POA: Diagnosis not present

## 2021-08-06 DIAGNOSIS — E1129 Type 2 diabetes mellitus with other diabetic kidney complication: Secondary | ICD-10-CM | POA: Diagnosis not present

## 2021-08-06 DIAGNOSIS — F325 Major depressive disorder, single episode, in full remission: Secondary | ICD-10-CM | POA: Diagnosis not present

## 2021-08-06 DIAGNOSIS — I129 Hypertensive chronic kidney disease with stage 1 through stage 4 chronic kidney disease, or unspecified chronic kidney disease: Secondary | ICD-10-CM | POA: Diagnosis not present

## 2021-08-06 DIAGNOSIS — E7800 Pure hypercholesterolemia, unspecified: Secondary | ICD-10-CM | POA: Diagnosis not present

## 2021-08-14 DIAGNOSIS — E785 Hyperlipidemia, unspecified: Secondary | ICD-10-CM | POA: Diagnosis not present

## 2021-08-14 DIAGNOSIS — N1831 Chronic kidney disease, stage 3a: Secondary | ICD-10-CM | POA: Diagnosis not present

## 2021-08-14 DIAGNOSIS — E1122 Type 2 diabetes mellitus with diabetic chronic kidney disease: Secondary | ICD-10-CM | POA: Diagnosis not present

## 2021-08-14 DIAGNOSIS — B180 Chronic viral hepatitis B with delta-agent: Secondary | ICD-10-CM | POA: Diagnosis not present

## 2021-08-14 DIAGNOSIS — E7800 Pure hypercholesterolemia, unspecified: Secondary | ICD-10-CM | POA: Diagnosis not present

## 2021-08-14 DIAGNOSIS — Z794 Long term (current) use of insulin: Secondary | ICD-10-CM | POA: Diagnosis not present

## 2021-08-14 DIAGNOSIS — G4700 Insomnia, unspecified: Secondary | ICD-10-CM | POA: Diagnosis not present

## 2021-08-14 DIAGNOSIS — F3342 Major depressive disorder, recurrent, in full remission: Secondary | ICD-10-CM | POA: Diagnosis not present

## 2021-08-14 DIAGNOSIS — I10 Essential (primary) hypertension: Secondary | ICD-10-CM | POA: Diagnosis not present

## 2021-08-14 DIAGNOSIS — E1142 Type 2 diabetes mellitus with diabetic polyneuropathy: Secondary | ICD-10-CM | POA: Diagnosis not present

## 2021-08-14 DIAGNOSIS — D509 Iron deficiency anemia, unspecified: Secondary | ICD-10-CM | POA: Diagnosis not present

## 2021-08-14 DIAGNOSIS — E1169 Type 2 diabetes mellitus with other specified complication: Secondary | ICD-10-CM | POA: Diagnosis not present

## 2021-08-21 DIAGNOSIS — M25552 Pain in left hip: Secondary | ICD-10-CM | POA: Diagnosis not present

## 2021-08-29 DIAGNOSIS — F325 Major depressive disorder, single episode, in full remission: Secondary | ICD-10-CM | POA: Diagnosis not present

## 2021-08-29 DIAGNOSIS — E1121 Type 2 diabetes mellitus with diabetic nephropathy: Secondary | ICD-10-CM | POA: Diagnosis not present

## 2021-08-29 DIAGNOSIS — E7800 Pure hypercholesterolemia, unspecified: Secondary | ICD-10-CM | POA: Diagnosis not present

## 2021-08-29 DIAGNOSIS — E1129 Type 2 diabetes mellitus with other diabetic kidney complication: Secondary | ICD-10-CM | POA: Diagnosis not present

## 2021-08-29 DIAGNOSIS — N1831 Chronic kidney disease, stage 3a: Secondary | ICD-10-CM | POA: Diagnosis not present

## 2021-09-04 ENCOUNTER — Encounter (HOSPITAL_COMMUNITY): Payer: Self-pay | Admitting: Radiology

## 2021-09-17 DIAGNOSIS — M25552 Pain in left hip: Secondary | ICD-10-CM | POA: Diagnosis not present

## 2021-09-18 DIAGNOSIS — M25552 Pain in left hip: Secondary | ICD-10-CM | POA: Diagnosis not present

## 2021-09-30 DIAGNOSIS — F325 Major depressive disorder, single episode, in full remission: Secondary | ICD-10-CM | POA: Diagnosis not present

## 2021-09-30 DIAGNOSIS — E1121 Type 2 diabetes mellitus with diabetic nephropathy: Secondary | ICD-10-CM | POA: Diagnosis not present

## 2021-09-30 DIAGNOSIS — E7800 Pure hypercholesterolemia, unspecified: Secondary | ICD-10-CM | POA: Diagnosis not present

## 2021-09-30 DIAGNOSIS — I129 Hypertensive chronic kidney disease with stage 1 through stage 4 chronic kidney disease, or unspecified chronic kidney disease: Secondary | ICD-10-CM | POA: Diagnosis not present

## 2021-09-30 DIAGNOSIS — N1831 Chronic kidney disease, stage 3a: Secondary | ICD-10-CM | POA: Diagnosis not present

## 2021-10-29 DIAGNOSIS — I129 Hypertensive chronic kidney disease with stage 1 through stage 4 chronic kidney disease, or unspecified chronic kidney disease: Secondary | ICD-10-CM | POA: Diagnosis not present

## 2021-10-29 DIAGNOSIS — F325 Major depressive disorder, single episode, in full remission: Secondary | ICD-10-CM | POA: Diagnosis not present

## 2021-10-29 DIAGNOSIS — N1831 Chronic kidney disease, stage 3a: Secondary | ICD-10-CM | POA: Diagnosis not present

## 2021-10-29 DIAGNOSIS — E7800 Pure hypercholesterolemia, unspecified: Secondary | ICD-10-CM | POA: Diagnosis not present

## 2021-10-29 DIAGNOSIS — E1129 Type 2 diabetes mellitus with other diabetic kidney complication: Secondary | ICD-10-CM | POA: Diagnosis not present

## 2021-11-20 ENCOUNTER — Ambulatory Visit
Admission: RE | Admit: 2021-11-20 | Discharge: 2021-11-20 | Disposition: A | Discharge status: Home / Self Care | Payer: PPO | Source: Ambulatory Visit | Attending: Geriatric Medicine | Admitting: Geriatric Medicine

## 2021-11-20 ENCOUNTER — Other Ambulatory Visit: Payer: Self-pay | Admitting: Geriatric Medicine

## 2021-11-20 DIAGNOSIS — M25562 Pain in left knee: Secondary | ICD-10-CM

## 2021-11-20 DIAGNOSIS — R413 Other amnesia: Secondary | ICD-10-CM | POA: Diagnosis not present

## 2021-11-20 DIAGNOSIS — M25552 Pain in left hip: Secondary | ICD-10-CM

## 2021-11-29 DIAGNOSIS — I129 Hypertensive chronic kidney disease with stage 1 through stage 4 chronic kidney disease, or unspecified chronic kidney disease: Secondary | ICD-10-CM | POA: Diagnosis not present

## 2021-11-29 DIAGNOSIS — E1129 Type 2 diabetes mellitus with other diabetic kidney complication: Secondary | ICD-10-CM | POA: Diagnosis not present

## 2021-11-29 DIAGNOSIS — E7800 Pure hypercholesterolemia, unspecified: Secondary | ICD-10-CM | POA: Diagnosis not present

## 2021-12-06 DIAGNOSIS — Z0000 Encounter for general adult medical examination without abnormal findings: Secondary | ICD-10-CM | POA: Diagnosis not present

## 2021-12-06 DIAGNOSIS — E1140 Type 2 diabetes mellitus with diabetic neuropathy, unspecified: Secondary | ICD-10-CM | POA: Diagnosis not present

## 2021-12-06 DIAGNOSIS — R413 Other amnesia: Secondary | ICD-10-CM | POA: Diagnosis not present

## 2021-12-06 DIAGNOSIS — I129 Hypertensive chronic kidney disease with stage 1 through stage 4 chronic kidney disease, or unspecified chronic kidney disease: Secondary | ICD-10-CM | POA: Diagnosis not present

## 2021-12-06 DIAGNOSIS — F325 Major depressive disorder, single episode, in full remission: Secondary | ICD-10-CM | POA: Diagnosis not present

## 2021-12-06 DIAGNOSIS — Z79899 Other long term (current) drug therapy: Secondary | ICD-10-CM | POA: Diagnosis not present

## 2021-12-06 DIAGNOSIS — E1121 Type 2 diabetes mellitus with diabetic nephropathy: Secondary | ICD-10-CM | POA: Diagnosis not present

## 2021-12-06 DIAGNOSIS — E7800 Pure hypercholesterolemia, unspecified: Secondary | ICD-10-CM | POA: Diagnosis not present

## 2021-12-06 DIAGNOSIS — G4733 Obstructive sleep apnea (adult) (pediatric): Secondary | ICD-10-CM | POA: Diagnosis not present

## 2021-12-06 DIAGNOSIS — D696 Thrombocytopenia, unspecified: Secondary | ICD-10-CM | POA: Diagnosis not present

## 2021-12-06 DIAGNOSIS — N1831 Chronic kidney disease, stage 3a: Secondary | ICD-10-CM | POA: Diagnosis not present

## 2021-12-06 DIAGNOSIS — E113299 Type 2 diabetes mellitus with mild nonproliferative diabetic retinopathy without macular edema, unspecified eye: Secondary | ICD-10-CM | POA: Diagnosis not present

## 2021-12-13 ENCOUNTER — Other Ambulatory Visit: Payer: PPO

## 2021-12-13 ENCOUNTER — Ambulatory Visit: Payer: PPO | Admitting: Physician Assistant

## 2021-12-13 ENCOUNTER — Encounter: Payer: Self-pay | Admitting: Physician Assistant

## 2021-12-13 VITALS — BP 152/68 | HR 60 | Resp 18 | Ht 75.0 in | Wt 155.0 lb

## 2021-12-13 DIAGNOSIS — F0150 Vascular dementia without behavioral disturbance: Secondary | ICD-10-CM

## 2021-12-13 DIAGNOSIS — F0280 Dementia in other diseases classified elsewhere without behavioral disturbance: Secondary | ICD-10-CM | POA: Diagnosis not present

## 2021-12-13 DIAGNOSIS — R413 Other amnesia: Secondary | ICD-10-CM

## 2021-12-13 DIAGNOSIS — G309 Alzheimer's disease, unspecified: Secondary | ICD-10-CM

## 2021-12-13 LAB — TSH: TSH: 1.18 u[IU]/mL (ref 0.35–5.50)

## 2021-12-13 LAB — VITAMIN B12: Vitamin B-12: >1504 pg/mL — ABNORMAL HIGH (ref 211–911)

## 2021-12-13 MED ORDER — MEMANTINE HCL 10 MG PO TABS: ORAL_TABLET | ORAL | 11 refills | Status: DC

## 2021-12-13 NOTE — Patient Instructions (Addendum)
It was a pleasure to see you today at our office.   Recommendations:  Follow up in  3 months   Memantine 10 mg take 1 tablet at night for 2 weeks then increase to one tab twice daily.Side effects were discussed  Neurocognitive testing Labs today  MRI brain   Whom to call:  Memory  decline, memory medications: Call our office [phone]   For psychiatric meds, mood meds: Please have your primary care physician manage these medications.   Counseling regarding caregiver distress, including caregiver depression, anxiety and issues regarding community resources, adult day care programs, adult living facilities, or memory care questions:   Feel free to contact [place], Social Worker at [phone]   For assessment of decision of mental capacity and competency:  Call Dr. Anthoney Harada, geriatric psychiatrist at [phone]  For guidance in geriatric dementia issues please call [facility] [phone]  For guidance regarding [facility] and if placement were needed at the facility, contact Arnell Asal, Social Worker tel: [phone]  If you have any severe symptoms of a stroke, or other severe issues such as confusion,severe chills or fever, etc call 911 or go to the ER as you may need to be evaluated further   Feel free to visit Facebook page " [email or web address]" for tips of how to care for people with memory problems.       RECOMMENDATIONS FOR ALL PATIENTS WITH MEMORY PROBLEMS: 1. Continue to exercise (Recommend 30 minutes of walking everyday, or 3 hours every week) 2. Increase social interactions - continue going to [place] and enjoy social gatherings with friends and family 3. Eat healthy, avoid fried foods and eat more fruits and vegetables 4. Maintain adequate blood pressure, blood sugar, and blood cholesterol level. Reducing the risk of stroke and cardiovascular disease also helps promoting better memory. 5. Avoid stressful situations.  Live a simple life and avoid aggravations. Organize your time and prepare for the next day in anticipation. 6. Sleep well, avoid any interruptions of sleep and avoid any distractions in the bedroom that may interfere with adequate sleep quality 7. Avoid sugar, avoid sweets as there is a strong link between excessive sugar intake, diabetes, and cognitive impairment We discussed the Mediterranean diet, which has been shown to help patients reduce the risk of progressive memory disorders and reduces cardiovascular risk. This includes eating fish, eat fruits and green leafy vegetables, nuts like almonds and hazelnuts, walnuts, and also use olive oil. Avoid fast foods and fried foods as much as possible. Avoid sweets and sugar as sugar use has been linked to worsening of memory function.  There is always a concern of gradual progression of memory problems. If this is the case, then we may need to adjust level of care according to patient needs. Support, both to the patient and caregiver, should then be put into place.    The Alzheimer's Association is here all day, every day for people facing Alzheimer's disease through our free 24/7 Helpline: [phone]. The Helpline provides reliable information and support to all those who need assistance, such as individuals living with memory loss, Alzheimer's or other dementia, caregivers, health care professionals and the public.  Our highly trained and knowledgeable staff can help you with: Understanding memory loss, dementia and Alzheimer's  Medications and other treatment options  General information about aging and brain health  Skills to provide quality care and to find the best care from professionals  Legal, financial and living-arrangement decisions Our Helpline  also features: Confidential care consultation provided by master's level clinicians who can help with decision-making support, crisis assistance and education on issues families face every day   Help in a caller's preferred language using our translation service that features more than 200 languages and dialects  Referrals to local community programs, services and ongoing support     FALL PRECAUTIONS: Be cautious when walking. Scan the area for obstacles that may increase the risk of trips and falls. When getting up in the mornings, sit up at the edge of the bed for a few minutes before getting out of bed. Consider elevating the bed at the head end to avoid drop of blood pressure when getting up. Walk always in a well-lit room (use night lights in the walls). Avoid area rugs or power cords from appliances in the middle of the walkways. Use a walker or a cane if necessary and consider physical therapy for balance exercise. Get your eyesight checked regularly.  FINANCIAL OVERSIGHT: Supervision, especially oversight when making financial decisions or transactions is also recommended.  HOME SAFETY: Consider the safety of the kitchen when operating appliances like stoves, microwave oven, and blender. Consider having supervision and share cooking responsibilities until no longer able to participate in those. Accidents with firearms and other hazards in the house should be identified and addressed as well.   ABILITY TO BE LEFT ALONE: If patient is unable to contact 911 operator, consider using LifeLine, or when the need is there, arrange for someone to stay with patients. Smoking is a fire hazard, consider supervision or cessation. Risk of wandering should be assessed by caregiver and if detected at any point, supervision and safe proof recommendations should be instituted.  MEDICATION SUPERVISION: Inability to self-administer medication needs to be constantly addressed. Implement a mechanism to ensure safe administration of the medications.   DRIVING: Regarding driving, in patients with progressive memory problems, driving will be impaired. We advise to have someone else do the driving if  trouble finding directions or if minor accidents are reported. Independent driving assessment is available to determine safety of driving.   If you are interested in the driving assessment, you can contact the following:  [employer] in [place]  [place] [phone] or [phone]      [place] refers to food and lifestyle choices that are based on the traditions of countries located on the [employer]. This way of eating has been shown to help prevent certain conditions and improve outcomes for people who have chronic diseases, like kidney disease and heart disease. What are tips for following this plan? Lifestyle  Cook and eat meals together with your family, when possible. Drink enough fluid to keep your urine clear or pale yellow. Be physically active every day. This includes: Aerobic exercise like running or swimming. Leisure activities like gardening, walking, or housework. Get 7-8 hours of sleep each night. If recommended by your health care provider, drink red wine in moderation. This means 1 glass a day for nonpregnant women and 2 glasses a day for men. A glass of wine equals 5 oz (150 mL). Reading food labels  Check the serving size of packaged foods. For foods such as rice and pasta, the serving size refers to the amount of cooked product, not dry. Check the total fat in packaged foods. Avoid foods that have saturated fat or trans fats. Check the ingredients list for added  sugars, such as corn syrup. Shopping  At the grocery store, buy most of your food from the areas near the walls of the store. This includes: Fresh fruits and vegetables (produce). Grains, beans, nuts, and seeds. Some of these may be available in unpackaged forms or large amounts (in bulk). Fresh seafood. Poultry and eggs. Low-fat dairy  products. Buy whole ingredients instead of prepackaged foods. Buy fresh fruits and vegetables in-season from local farmers markets. Buy frozen fruits and vegetables in resealable bags. If you do not have access to quality fresh seafood, buy precooked frozen shrimp or canned fish, such as tuna, salmon, or sardines. Buy small amounts of raw or cooked vegetables, salads, or olives from the deli or salad bar at your store. Stock your pantry so you always have certain foods on hand, such as olive oil, canned tuna, canned tomatoes, rice, pasta, and beans. Cooking  Cook foods with extra-virgin olive oil instead of using butter or other vegetable oils. Have meat as a side dish, and have vegetables or grains as your main dish. This means having meat in small portions or adding small amounts of meat to foods like pasta or stew. Use beans or vegetables instead of meat in common dishes like chili or lasagna. Experiment with different cooking methods. Try roasting or broiling vegetables instead of steaming or sauteing them. Add frozen vegetables to soups, stews, pasta, or rice. Add nuts or seeds for added healthy fat at each meal. You can add these to yogurt, salads, or vegetable dishes. Marinate fish or vegetables using olive oil, lemon juice, garlic, and fresh herbs. Meal planning  Plan to eat 1 vegetarian meal one day each week. Try to work up to 2 vegetarian meals, if possible. Eat seafood 2 or more times a week. Have healthy snacks readily available, such as: Vegetable sticks with hummus. Greek yogurt. Fruit and nut trail mix. Eat balanced meals throughout the week. This includes: Fruit: 2-3 servings a day Vegetables: 4-5 servings a day Low-fat dairy: 2 servings a day Fish, poultry, or lean meat: 1 serving a day Beans and legumes: 2 or more servings a week Nuts and seeds: 1-2 servings a day Whole grains: 6-8 servings a day Extra-virgin olive oil: 3-4 servings a day Limit red meat and sweets  to only a few servings a month What are my food choices? Mediterranean diet Recommended Grains: Whole-grain pasta. Brown rice. Bulgar wheat. Polenta. Couscous. Whole-wheat bread. [place]. Vegetables: Artichokes. Beets. Broccoli. Cabbage. Carrots. Eggplant. Green beans. Chard. Kale. Spinach. Onions. Leeks. Peas. Squash. Tomatoes. Peppers. Radishes. Fruits: Apples. Apricots. Avocado. Berries. Bananas. Cherries. Dates. Figs. Grapes. Lemons. Melon. Oranges. Peaches. Plums. Pomegranate. Meats and other protein foods: Beans. Almonds. Sunflower seeds. Pine nuts. Peanuts. [place]. Salmon. Scallops. Shrimp. [place]. Tilapia. Clams. Oysters. Eggs. Dairy: Low-fat milk. Cheese. Greek yogurt. Beverages: Water. Red wine. Herbal tea. Fats and oils: Extra virgin olive oil. Avocado oil. Grape seed oil. Sweets and desserts: [place] yogurt with honey. Baked apples. Poached pears. Trail mix. Seasoning and other foods: Basil. Cilantro. Coriander. Cumin. Mint. Parsley. Sage. Rosemary. Tarragon. Garlic. Oregano. Thyme. Pepper. Balsalmic vinegar. Tahini. Hummus. Tomato sauce. Olives. Mushrooms. Limit these Grains: Prepackaged pasta or rice dishes. Prepackaged cereal with added sugar. Vegetables: Deep fried potatoes (french fries). Fruits: Fruit canned in syrup. Meats and other protein foods: Beef. Pork. Lamb. Poultry with skin. Hot dogs. Berniece Salines. Dairy: Ice cream. Sour cream. Whole milk. Beverages: Juice. Sugar-sweetened soft drinks. Beer. Liquor and spirits. Fats and oils: Butter. Canola oil. Vegetable oil. Beef  fat (tallow). Lard. Sweets and desserts: Cookies. Cakes. Pies. Candy. Seasoning and other foods: Mayonnaise. Premade sauces and marinades. The items listed may not be a complete list. Talk with your dietitian about what dietary choices are right for you. Summary The Mediterranean diet includes both food and lifestyle choices. Eat a variety of fresh fruits and vegetables, beans, nuts, seeds, and whole  grains. Limit the amount of red meat and sweets that you eat. Talk with your health care provider about whether it is safe for you to drink red wine in moderation. This means 1 glass a day for nonpregnant women and 2 glasses a day for men. A glass of wine equals 5 oz (150 mL). This information is not intended to replace advice given to you by your health care provider. Make sure you discuss any questions you have with your health care provider. Document Released: 02/14/2016 Document Revised: 03/18/2016 Document Reviewed: 02/14/2016 Elsevier Interactive Patient Education  2017 [employer].   We have sent a referral to [facility] for your MRI and they will call you directly to schedule your appointment. They are located at [address]. If you need to contact them directly please call [phone].  Your provider has requested that you have labwork completed today. Please go to [facility] ([id number]) on the second floor of this building before leaving the office today. You do not need to check in. If you are not called within 15 minutes please check with the front desk.

## 2021-12-13 NOTE — Progress Notes (Unsigned)
Assessment/Plan:    The patient is seen in neurologic consultation at the request of Lajean Manes, MD for the evaluation of memory.  [name] is a very pleasant 86 y.o. year old RH male with  a history of hypertension, hyperlipidemia, DM with peripheral neuropathy and diabetic retinopathy, bilateral mild ICA stenosis, CHF, history of sick sinus syndrome, anxiety, B12 deficiency, anemia of chronic disease, hypothyroidism, history of Gilbert's syndrome, stage III CKD, ITP, OSA with failed CPAP in September 2006, seen today for evaluation of memory loss. MoCA today is15/30 with delayed recall 1/5 and deficiencies in visuospatial/executive, memory, attention, abstraction, and orientation, findings suspicious for mild dementia due to mixed vascular and Alzheimer's disease.   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 Side effects discussed [place] up in 3 months   Subjective:    The patient is accompanied by his wife and his daughter who supplement the history.    How long did patient have memory difficulties?   His daughter and his wife noted memory changes dating back to 5-6 years ago, attributed to "age", but they report worsening over the last 6 months " he does not remember that he has grandchildren"-daughter says. STM worse than his LTM though.  Patient lives with: [facility] [place].    repeats oneself? Endorsed, especially with appointments Disoriented when walking into a room? Daughter reports that at times he says "I want to go home, referring to his childhood home" Leaving objects in  unusual places?  Patient denies but daughter states that " things get lost all the time , such as the wallet on the bed sheets"  Ambulates  with difficulty?   Patient has a history of diabetic neuropathy, and has been evaluated in the past confirming the diagnosis.  He is using a cane to ambulate, he admits to not having been walking frequently or being as active. PT was recommended but patient declines. He does not participate often in any activities at [place] because "it is too far and I need the car to get there" Recent falls?  " fell out of bed a few weeks ago when I was turning, hurt my pride " Any head injuries?  Patient denies   History of seizures?   Patient denies   Wandering behavior?  Patient denies  Patient drives?   He still drives but in one incident, didn't know how to get home. "He has decreased reaction time"-wife says. "He is afraid to lose his license"   Any mood changes such irritability agitation?  Very angry because he lost his car key and cannot drive.  Any history of depression?: Had temporarily depression in the distant past, but he denies any current symptoms  Hallucinations?  Patient denies   Paranoia?  Patient denies   Patient reports that he sleeps well without vivid dreams, REM behavior or sleepwalking but his wife reports that at 2 am, he gets up and starts to go though different drawers   History of sleep apnea?  Patient denies   Any hygiene concerns?  Patient denies   Independent of bathing and dressing?  Endorsed  Does the patient needs  help with medications?  Wife is in charge Who is in charge of the finances?  Daughter  Any changes in appetite?  Patient denies , lost some weight,because "the cafeteria is too far and I stay at home, eat what I have there"   Patient have trouble swallowing? Patient denies   Does the patient cook?  Patient denies   Any kitchen accidents such as leaving the stove on? Patient denies   Any headaches?  Patient denies   The double  vision? Patient denies   Any focal numbness or tingling?  Patient denies   Chronic back pain Patient has chronic pain and has refused PT in the past  Unilateral weakness?  Patient denies   Any tremors?  Patient denies   Any history of anosmia?  Patient denies   Any incontinence of urine?  Endorsed, uses diapers  Any bowel dysfunction?  Chronic diarrhea History of heavy alcohol intake?  Patient denies   History of heavy tobacco use?  Patient denies   Family history of dementia?  Patient  Mother had AD Dementia   Brother LBD    Labs : 12/06/2021, lipid panel normal, A1c 7.9, total bilirubin 1.4, CBC with MCV 94.2, H&H 12.4-35.9 respectively, platelets 228   Initial visit for gait instability, Dr. Tomi Likens 01/26/2020 He and his wife began noticing problems with balance in late 2018, which has gradually gotten worse.  He was initially seen by me in consultation in 2019.  At that time he endorsed that when walking, he feels off-balance.  He feels that he is taking shorter steps and dragging his heels, right foot worse than left foot.  He fell one time while standing at the commode to urinate, he lost his balance and fell forward.  He says his legs feel weak.  While he has some neuropathic pain in the feet, he denies sensation of numbness or difficulty feeling the ground when standing.  He has history of low back pain with lumbar radiculopathy, but currently denies low back or radicular pain in the legs.   remote MRI of lumbar spine from 12/04/09 demonstrated right L4-L5 synovial cyst arising from the facet joint, compressing the right L5 nerve root.  He reported bilateral hip pain and has arthritic changes in the hips.  He denied change in bowel or bladder function, although he does wake up during the night to urinate.  He denied neck pain or symptoms in the upper extremities.  He denied difficulty with vision.  He denied freezing, tremor or difficulty swallowing.  At the time, he had uncontrolled type 2  diabetes mellitus with A1c of 7.7 and borderline B12 level for which he started supplements.  He reported that gait improved with physical therapy.  However, he subsequently developed worsening problems with gait and balance over the past year.  He discontinued Crestor but weakness didn't improve.  MRI of lumbar spine on 09/26/2019 showed stable multilevel spondylosis with mild spinal stenosis at L3-4 and small synovial cyst from left facet joint at L5-S1 possibly encroaching the left S1 nerve root.  Hgb A1c from 10/31/2019 was 9.2 (8.1 in October 2020).  TSH from October 2020 was 1.31.  MRI of brain with and without contrast on 01/16/2020 showed moderate atrophy and chronic small vessel ischemic changes in the periventricular and subcortical white matter but no acute intracranial abnormality or hydrocephalus.He reports pain in both hips while walking.  He feels weak in the lower legs.  He needs to use the cane or he will  fall. He has had about 7 falls in the last 6 months, without the cane.  Not associated with dizziness or back pain.     Family history is notable as his brother has dementia with Lewy Body disease and his mother had history of memory difficulty.   Carotid doppler from 09/15/17 demonstrated no hemodynamically significant ICA stenosis with antegrade flow in both vertebral arteries.   I suspect etiology is due to polyneuropathy secondary to diabetes mellitus.  He does not exhibit any signs of a neurodegenerative disorder such as Parkinson's disease.  MRI of brain does not reveal anything significant, such as stroke, mass lesion or hydrocephalus.  MRI of lumbar spine does not reveal any significant stenosis or nerve root impingement.  He does not exhibit any signs of myelopathy on exam.  He does not exhibit any significant objective muscle weakness.  He does exhibit sensory loss in the feet.  I suspect the worsening balance and increased falls over the past several months is related to deconditioning  (more sedentary during the COVID pandemic) and worsening diabetes over the past year.  At this point, I recommend continuing use of cane and to continue trying to optimize glycemic control.  Allergies  Allergen Reactions   Ace Inhibitors    Alpha Lipoic Acid [Lipoic Acid]     Upset Stomach   Invokana [Canagliflozin]    Heparin Hives   Isosorbide Mononitrate [Isosorbide Dinitrate Er] Hives   Penicillins Hives   Sulfonamide Derivatives Hives    Current Outpatient Medications  Medication Instructions   allopurinol (ZYLOPRIM) 300 mg, Daily   amLODipine (NORVASC) 10 MG tablet TAKE 1 TABLET ONCE DAILY.   aspirin 81 mg, Oral, Daily   Benicar 20 mg, Oral, Daily   calcium-vitamin D (OSCAL WITH D) 500-200 MG-UNIT per tablet 1 tablet, Daily   citalopram (CELEXA) 40 mg, Daily   Coenzyme Q10 (CO Q 10 PO) Take by mouth. 1 tab daily   Crestor 10 mg, Oral, Daily   diazepam (VALIUM) 5 mg, Oral, Daily PRN   diphenoxylate-atropine (LOMOTIL) 2.5-0.025 MG per tablet 1 tablet, 4 times daily PRN   famotidine (PEPCID) 20 mg, Oral, 2 times daily   fluticasone (FLONASE) 50 MCG/ACT nasal spray 2 sprays, Each Nare, As needed   hydrochlorothiazide (HYDRODIURIL) 25 MG tablet TAKE 1 TABLET ONCE DAILY.   ipratropium-albuterol (DUONEB) 0.5-2.5 (3) MG/3ML SOLN 3 mLs, Inhalation, Every 4 hours PRN   Januvia 100 mg, Oral, Daily   Lantus SoloStar 56-58 Units, Subcutaneous, Every morning   memantine (NAMENDA) 10 MG tablet Take 1 tablet (10 mg at night) for 2 weeks, then increase to 1 tablet (10 mg) twice a day   metFORMIN (GLUCOPHAGE-XR) 500 mg, Oral, Daily   montelukast (SINGULAIR) 10 mg, Oral, Daily   nabumetone (RELAFEN) 500 mg, Oral, 2 times daily PRN   nitroGLYCERIN (NITROSTAT) 0.4 MG SL tablet PLACE 1 TABLET UNDER THE TONGUE EVERY 5 MINUTES AS NEEDED FOR CHEST PAIN.   Saline (SIMPLY SALINE) 0.9 % AERS Nasal   spironolactone (ALDACTONE) 25 mg, Oral, Daily   vitamin B-12 (CYANOCOBALAMIN) 500 mcg, Oral, Daily    zolpidem (AMBIEN) 5 mg, Daily at bedtime     VITALS:   Vitals:   12/13/21 1333  BP: (!) 152/68  Pulse: 60  Resp: 18  SpO2: 97%  Weight: 155 lb (70.3 kg)  Height: '6\' 3"'$  (1.905 m)       No data to display          PHYSICAL EXAM  HEENT:  Normocephalic, atraumatic. The mucous membranes are moist. The superficial temporal arteries are without ropiness or tenderness. Cardiovascular: Regular rate and rhythm. Lungs: Clear to auscultation bilaterally. Neck: There are no carotid bruits noted bilaterally.  NEUROLOGICAL:    12/15/2021   10:00 AM  Montreal Cognitive Assessment   Visuospatial/ Executive (0/5) 1  Naming (0/3) 2  Attention: Read list of digits (0/2) 1  Attention: Read list of letters (0/1) 1  Attention: Serial 7 subtraction starting at 100 (0/3) 3  Language: Repeat phrase (0/2) 2  Language : Fluency (0/1) 0  Abstraction (0/2) 1  Delayed Recall (0/5) 1  Orientation (0/6) 3  Total 15  Adjusted Score (based on education) 15        No data to display           Orientation:  Alert and oriented to person, place not to time, yr is 1921.  No aphasia or dysarthria. Fund of knowledge is reduced. Recent and remote memory impaired  Attention and concentration are reduced. Able to name objects 2/3  and repeat phrases 1/2. Delayed recall 1/5 Cranial nerves: There is good facial symmetry. Extraocular muscles are intact and visual fields are full to confrontational testing. Speech is fluent and clear. Soft palate rises symmetrically and there is no tongue deviation. Hearing is intact to conversational tone. Tone: Tone is good throughout. Sensation: Sensation is intact to light touch and pinprick throughout. Vibration is reduced at the big toe bilaterally. There is no extinction with double simultaneous stimulation. There is no sensory dermatomal level identified. Coordination: The patient has no difficulty with RAM's or FNF bilaterally. Normal finger to nose  Motor:  Strength is 5/5 in the bilateral upper and lower extremities. There is no pronator drift. There are no fasciculations noted. DTR's: Deep tendon reflexes are 2/4 at the bilateral biceps, triceps, brachioradialis, patella and achilles.  Plantar responses are downgoing bilaterally. Gait and Station: The patient is able to ambulate without difficulty with a walker. Unable to stand  in Romberg position. Gait with mild limp    Thank you for allowing [place] the opportunity to participate in the care of this nice patient. Please do not hesitate to contact us for any questions or concerns.   Total time spent on today's visit was 62 minutes dedicated to this patient today, preparing to see patient, examining the patient, ordering tests and/or medications and counseling the patient, documenting clinical information in the EHR or other health record, independently interpreting results and communicating results to the patient/family, discussing treatment and goals, answering patient's questions and coordinating care.  Cc:  Lajean Manes, MD  Sharene Butters 12/15/2021 10:04 AM

## 2021-12-15 DIAGNOSIS — F0390 Unspecified dementia without behavioral disturbance: Secondary | ICD-10-CM

## 2021-12-15 DIAGNOSIS — F0150 Vascular dementia without behavioral disturbance: Secondary | ICD-10-CM | POA: Insufficient documentation

## 2021-12-15 HISTORY — DX: Unspecified dementia, unspecified severity, without behavioral disturbance, psychotic disturbance, mood disturbance, and anxiety: F03.90

## 2021-12-17 ENCOUNTER — Telehealth: Payer: Self-pay | Admitting: Physician Assistant

## 2021-12-17 NOTE — Telephone Encounter (Signed)
Patient daughter would like to speak to someone about patient B-12 lab results it was high and she needs to know if the medication needs to be adjusted  please call

## 2021-12-17 NOTE — Telephone Encounter (Signed)
ateitns daughter said she feels patietn is having some side effects  like feeling nauseous  and dizzy to the high B-12 and would like a new perscription sent to [facility] stating he is taking 500MCG every other day

## 2021-12-17 NOTE — Telephone Encounter (Signed)
Called daughter and let her know I would reach out to the provider for those results and get back to them with her recommendations

## 2021-12-28 ENCOUNTER — Ambulatory Visit
Admission: RE | Admit: 2021-12-28 | Discharge: 2021-12-28 | Disposition: A | Discharge status: Home / Self Care | Payer: PPO | Source: Ambulatory Visit | Attending: Physician Assistant | Admitting: Physician Assistant

## 2021-12-28 DIAGNOSIS — I619 Nontraumatic intracerebral hemorrhage, unspecified: Secondary | ICD-10-CM | POA: Diagnosis not present

## 2021-12-28 DIAGNOSIS — R413 Other amnesia: Secondary | ICD-10-CM | POA: Diagnosis not present

## 2021-12-28 DIAGNOSIS — G319 Degenerative disease of nervous system, unspecified: Secondary | ICD-10-CM | POA: Diagnosis not present

## 2021-12-28 DIAGNOSIS — I739 Peripheral vascular disease, unspecified: Secondary | ICD-10-CM | POA: Diagnosis not present

## 2021-12-31 ENCOUNTER — Telehealth: Payer: Self-pay | Admitting: Physician Assistant

## 2021-12-31 DIAGNOSIS — F325 Major depressive disorder, single episode, in full remission: Secondary | ICD-10-CM | POA: Diagnosis not present

## 2021-12-31 DIAGNOSIS — E7800 Pure hypercholesterolemia, unspecified: Secondary | ICD-10-CM | POA: Diagnosis not present

## 2021-12-31 DIAGNOSIS — I129 Hypertensive chronic kidney disease with stage 1 through stage 4 chronic kidney disease, or unspecified chronic kidney disease: Secondary | ICD-10-CM | POA: Diagnosis not present

## 2021-12-31 DIAGNOSIS — N1831 Chronic kidney disease, stage 3a: Secondary | ICD-10-CM | POA: Diagnosis not present

## 2021-12-31 DIAGNOSIS — E1129 Type 2 diabetes mellitus with other diabetic kidney complication: Secondary | ICD-10-CM | POA: Diagnosis not present

## 2021-12-31 NOTE — Telephone Encounter (Signed)
[name] from [facility] called, she is inquiring about [name]'s vitamin b12. Stated it was changed to every other day vs every day. If it is every other day a new RX needs to be sent to [facility]. I did look through notes, and see where the daughter called in about it.

## 2022-01-01 NOTE — Telephone Encounter (Signed)
Called pharmacy

## 2022-01-02 ENCOUNTER — Other Ambulatory Visit: Payer: Self-pay

## 2022-01-02 ENCOUNTER — Telehealth: Payer: Self-pay | Admitting: Physician Assistant

## 2022-01-02 MED ORDER — MEMANTINE HCL 10 MG PO TABS: ORAL_TABLET | ORAL | 4 refills | Status: DC

## 2022-01-02 MED ORDER — CYANOCOBALAMIN 500 MCG PO TABS: 500.0000 ug | ORAL_TABLET | ORAL | 0 refills | Status: DC

## 2022-01-02 NOTE — Telephone Encounter (Signed)
Lelan Pons, [name]'s daughter needs a call back regarding his vitamin b12. Seems to be a lot of  confusion. She needs a new RX for every other day

## 2022-01-02 NOTE — Telephone Encounter (Signed)
Called patients daughter resent prescription after I changed it to reflect every other day

## 2022-01-03 NOTE — Progress Notes (Signed)
Please inform the patient that the brain MRI show atrophy and chronic vessels changes. Atrophy explains the memory changes. Thanks

## 2022-01-03 NOTE — Progress Notes (Signed)
I left message to call office at 3:31pm to discuss MRI results

## 2022-01-27 ENCOUNTER — Telehealth: Payer: Self-pay | Admitting: Physician Assistant

## 2022-01-27 DIAGNOSIS — I129 Hypertensive chronic kidney disease with stage 1 through stage 4 chronic kidney disease, or unspecified chronic kidney disease: Secondary | ICD-10-CM | POA: Diagnosis not present

## 2022-01-27 DIAGNOSIS — F325 Major depressive disorder, single episode, in full remission: Secondary | ICD-10-CM | POA: Diagnosis not present

## 2022-01-27 DIAGNOSIS — E1129 Type 2 diabetes mellitus with other diabetic kidney complication: Secondary | ICD-10-CM | POA: Diagnosis not present

## 2022-01-27 DIAGNOSIS — E7800 Pure hypercholesterolemia, unspecified: Secondary | ICD-10-CM | POA: Diagnosis not present

## 2022-01-27 DIAGNOSIS — N1831 Chronic kidney disease, stage 3a: Secondary | ICD-10-CM | POA: Diagnosis not present

## 2022-01-27 NOTE — Telephone Encounter (Signed)
Called pt daughter and she reported That while she was out of town for a few days and had the person to check on them and make sure medication is taken and feeding. This did not happen. Her dad was being agitated with wife. Wanted to know if missing medication would make him do this. She reported that he was better today. Told her she may want to follow up with PCP if he changes again and to call us if needed.,

## 2022-01-27 NOTE — Telephone Encounter (Signed)
Patients daughter called and wanted to speak with someone about a few things going on with [name].  Please call her back.

## 2022-01-27 NOTE — Telephone Encounter (Signed)
Pt called and no answer, left message to call back

## 2022-01-28 DIAGNOSIS — G3184 Mild cognitive impairment, so stated: Secondary | ICD-10-CM | POA: Diagnosis not present

## 2022-01-28 DIAGNOSIS — R399 Unspecified symptoms and signs involving the genitourinary system: Secondary | ICD-10-CM | POA: Diagnosis not present

## 2022-01-28 DIAGNOSIS — R413 Other amnesia: Secondary | ICD-10-CM | POA: Diagnosis not present

## 2022-02-06 DIAGNOSIS — G3184 Mild cognitive impairment, so stated: Secondary | ICD-10-CM | POA: Diagnosis not present

## 2022-02-06 DIAGNOSIS — R41841 Cognitive communication deficit: Secondary | ICD-10-CM | POA: Diagnosis not present

## 2022-02-13 DIAGNOSIS — G3184 Mild cognitive impairment, so stated: Secondary | ICD-10-CM | POA: Diagnosis not present

## 2022-02-13 DIAGNOSIS — R41841 Cognitive communication deficit: Secondary | ICD-10-CM | POA: Diagnosis not present

## 2022-02-20 DIAGNOSIS — G3184 Mild cognitive impairment, so stated: Secondary | ICD-10-CM | POA: Diagnosis not present

## 2022-02-20 DIAGNOSIS — R41841 Cognitive communication deficit: Secondary | ICD-10-CM | POA: Diagnosis not present

## 2022-02-26 DIAGNOSIS — I129 Hypertensive chronic kidney disease with stage 1 through stage 4 chronic kidney disease, or unspecified chronic kidney disease: Secondary | ICD-10-CM | POA: Diagnosis not present

## 2022-02-26 DIAGNOSIS — F325 Major depressive disorder, single episode, in full remission: Secondary | ICD-10-CM | POA: Diagnosis not present

## 2022-02-26 DIAGNOSIS — N1831 Chronic kidney disease, stage 3a: Secondary | ICD-10-CM | POA: Diagnosis not present

## 2022-02-26 DIAGNOSIS — E7800 Pure hypercholesterolemia, unspecified: Secondary | ICD-10-CM | POA: Diagnosis not present

## 2022-02-26 DIAGNOSIS — E1129 Type 2 diabetes mellitus with other diabetic kidney complication: Secondary | ICD-10-CM | POA: Diagnosis not present

## 2022-03-05 DIAGNOSIS — R41841 Cognitive communication deficit: Secondary | ICD-10-CM | POA: Diagnosis not present

## 2022-03-05 DIAGNOSIS — G3184 Mild cognitive impairment, so stated: Secondary | ICD-10-CM | POA: Diagnosis not present

## 2022-03-13 DIAGNOSIS — G3184 Mild cognitive impairment, so stated: Secondary | ICD-10-CM | POA: Diagnosis not present

## 2022-03-13 DIAGNOSIS — R41841 Cognitive communication deficit: Secondary | ICD-10-CM | POA: Diagnosis not present

## 2022-03-14 DIAGNOSIS — E1140 Type 2 diabetes mellitus with diabetic neuropathy, unspecified: Secondary | ICD-10-CM | POA: Diagnosis not present

## 2022-03-14 DIAGNOSIS — L608 Other nail disorders: Secondary | ICD-10-CM | POA: Diagnosis not present

## 2022-03-20 DIAGNOSIS — R41841 Cognitive communication deficit: Secondary | ICD-10-CM | POA: Diagnosis not present

## 2022-03-20 DIAGNOSIS — G3184 Mild cognitive impairment, so stated: Secondary | ICD-10-CM | POA: Diagnosis not present

## 2022-03-24 NOTE — Progress Notes (Unsigned)
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 RH male with  a history of hypertension, hyperlipidemia, DM with peripheral neuropathy and diabetic retinopathy, bilateral mild ICA stenosis, CHF, history of sick sinus syndrome, anxiety, B12 deficiency, anemia of chronic disease, hypothyroidism, history of Gilbert's syndrome, stage III CKD, ITP, OSA with failed CPAP in September 2006 seen today in follow up for memory loss. Patient is currently on . MRI brain personally reviewed was remarkable for  MMSE on 12/17/21 was 15/30      Follow up in 6  months. Continue mood control as per PCP Continue memantine 10 mg bid, side effects discussed  Neurocognitive testing is scheduled for 06/03/22 for clarity of diagnoses, disease trajectory and to determine otheer sources of memory diffculties including depression and anxiety.     Subjective:    This patient is accompanied in the office by  who supplements the history.  Previous records as well as any outside records available were reviewed prior to todays visit.    Any changes in memory since last visit? repeats oneself?  Endorsed Disoriented when walking into a room?  Patient denies   Leaving objects in unusual places?  Patient denies   Ambulates  with difficulty?   Patient denies   Recent falls?  Patient denies   Any head injuries?  Patient denies   History of seizures?   Patient denies   Wandering behavior?  Patient denies   Patient drives?   Patient no longer drives  Any mood changes since last visit?  Patient denies   Any worsening depression?:  Patient denies   Hallucinations?  Patient denies   Paranoia?  Patient denies   Patient reports that sleeps well without vivid dreams, REM behavior or sleepwalking   History of sleep apnea?  Patient denies   Any hygiene concerns?  Patient denies   Independent of bathing and dressing?  Endorsed  Does the patient needs  help with medications?  Denies Who is in charge of the finances?   is in charge    Any changes in appetite?  Patient denies   Patient have trouble swallowing? Patient denies   Does the patient cook?  Patient denies   Any kitchen accidents such as leaving the stove on? Patient denies   Any headaches?  Patient denies   Double vision? Patient denies   Any focal numbness or tingling?  Patient denies   Chronic back pain Patient denies   Unilateral weakness?  Patient denies   Any tremors?  Patient denies   Any history of anosmia?  Patient denies   Any incontinence of urine?  Patient denies   Any bowel dysfunction?   Patient denies      Patient lives with:   Initial visit for gait instability, Dr. Tomi Likens 01/26/2020 He and his wife began noticing problems with balance in late 2018, which has gradually gotten worse.  He was initially seen by me in consultation in 2019.  At that time he endorsed that when walking, he feels off-balance.  He feels that he is taking shorter steps and dragging his heels, right foot worse than left foot.  He fell one time while standing at the commode to urinate, he lost his balance and fell forward.  He says his legs feel weak.  While he has some neuropathic pain in the feet, he denies sensation of numbness or difficulty feeling the ground when standing.  He has  history of low back pain with lumbar radiculopathy, but currently denies low back or radicular pain in the legs.   remote MRI of lumbar spine from 12/04/09 demonstrated right L4-L5 synovial cyst arising from the facet joint, compressing the right L5 nerve root.  He reported bilateral hip pain and has arthritic changes in the hips.  He denied change in bowel or bladder function, although he does wake up during the night to urinate.  He denied neck pain or symptoms in the upper extremities.  He denied difficulty with vision.  He denied freezing, tremor or difficulty swallowing.  At the time, he had uncontrolled type 2  diabetes mellitus with A1c of 7.7 and borderline B12 level for which he started supplements.  He reported that gait improved with physical therapy.  However, he subsequently developed worsening problems with gait and balance over the past year.  He discontinued Crestor but weakness didn't improve.  MRI of lumbar spine on 09/26/2019 showed stable multilevel spondylosis with mild spinal stenosis at L3-4 and small synovial cyst from left facet joint at L5-S1 possibly encroaching the left S1 nerve root.  Hgb A1c from 10/31/2019 was 9.2 (8.1 in October 2020).  TSH from October 2020 was 1.31.  MRI of brain with and without contrast on 01/16/2020 showed moderate atrophy and chronic small vessel ischemic changes in the periventricular and subcortical white matter but no acute intracranial abnormality or hydrocephalus.He reports pain in both hips while walking.  He feels weak in the lower legs.  He needs to use the cane or he will fall. He has had about 7 falls in the last 6 months, without the cane.  Not associated with dizziness or back pain.     Family history is notable as his brother has dementia with Lewy Body disease and his mother had history of memory difficulty.   Carotid doppler from 09/15/17 demonstrated no hemodynamically significant ICA stenosis with antegrade flow in both vertebral arteries.    I suspect etiology is due to polyneuropathy secondary to diabetes mellitus.  He does not exhibit any signs of a neurodegenerative disorder such as Parkinson's disease.  MRI of brain does not reveal anything significant, such as stroke, mass lesion or hydrocephalus.  MRI of lumbar spine does not reveal any significant stenosis or nerve root impingement.  He does not exhibit any signs of myelopathy on exam.  He does not exhibit any significant objective muscle weakness.  He does exhibit sensory loss in the feet.  I suspect the worsening balance and increased falls over the past several months is related to  deconditioning (more sedentary during the COVID pandemic) and worsening diabetes over the past year.  At this point, I recommend continuing use of cane and to continue trying to optimize glycemic control. PREVIOUS MEDICATIONS:   CURRENT MEDICATIONS:  Outpatient Encounter Medications as of 03/25/2022  Medication Sig   allopurinol (ZYLOPRIM) 300 MG tablet Take 300 mg by mouth daily.    amLODipine (NORVASC) 10 MG tablet TAKE 1 TABLET ONCE DAILY.   aspirin 81 MG tablet Take 1 tablet (81 mg total) by mouth daily.   BENICAR 20 MG tablet Take 20 mg by mouth daily.    calcium-vitamin D (OSCAL WITH D) 500-200 MG-UNIT per tablet Take 1 tablet by mouth daily. Vitamin D 3  daily   citalopram (CELEXA) 40 MG tablet Take 40 mg by mouth daily.    Coenzyme Q10 (CO Q 10 PO) Take by mouth. 1 tab daily   CRESTOR 20 MG  tablet Take 10 mg by mouth daily.    diazepam (VALIUM) 5 MG tablet Take 5 mg by mouth daily as needed for anxiety.    diphenoxylate-atropine (LOMOTIL) 2.5-0.025 MG per tablet Take 1 tablet by mouth 4 (four) times daily as needed for diarrhea or loose stools.   famotidine (PEPCID) 20 MG tablet Take 20 mg by mouth 2 (two) times daily.   fluticasone (FLONASE) 50 MCG/ACT nasal spray Place 2 sprays into both nostrils as needed for allergies or rhinitis.   hydrochlorothiazide (HYDRODIURIL) 25 MG tablet TAKE 1 TABLET ONCE DAILY.   ipratropium-albuterol (DUONEB) 0.5-2.5 (3) MG/3ML SOLN Inhale 3 mLs into the lungs every 4 (four) hours as needed.   JANUVIA 100 MG tablet Take 100 mg by mouth daily.   LANTUS SOLOSTAR 100 UNIT/ML injection Inject 56-58 Units into the skin every morning.    memantine (NAMENDA) 10 MG tablet Take 1 tablet (10 mg) twice a day   metFORMIN (GLUCOPHAGE-XR) 500 MG 24 hr tablet Take 500 mg by mouth daily.   montelukast (SINGULAIR) 10 MG tablet Take 10 mg by mouth daily.   nabumetone (RELAFEN) 500 MG tablet Take 500 mg by mouth 2 (two) times daily as needed for mild pain.     nitroGLYCERIN (NITROSTAT) 0.4 MG SL tablet PLACE 1 TABLET UNDER THE TONGUE EVERY 5 MINUTES AS NEEDED FOR CHEST PAIN.   Saline (SIMPLY SALINE) 0.9 % AERS Place into the nose.   spironolactone (ALDACTONE) 25 MG tablet Take 25 mg by mouth daily.   vitamin B-12 (CYANOCOBALAMIN) 500 MCG tablet Take 1 tablet (500 mcg total) by mouth every other day.   zolpidem (AMBIEN) 10 MG tablet Take 5 mg by mouth at bedtime. For sleep   No facility-administered encounter medications on file as of 03/25/2022.        No data to display            12/15/2021   10:00 AM  Montreal Cognitive Assessment   Visuospatial/ Executive (0/5) 1  Naming (0/3) 2  Attention: Read list of digits (0/2) 1  Attention: Read list of letters (0/1) 1  Attention: Serial 7 subtraction starting at 100 (0/3) 3  Language: Repeat phrase (0/2) 2  Language : Fluency (0/1) 0  Abstraction (0/2) 1  Delayed Recall (0/5) 1  Orientation (0/6) 3  Total 15  Adjusted Score (based on education) 15    Objective:     PHYSICAL EXAMINATION:    VITALS:  There were no vitals filed for this visit.  GEN:  The patient appears stated age and is in NAD. HEENT:  Normocephalic, atraumatic.   Neurological examination:  General: NAD, well-groomed, appears stated age. Orientation: The patient is alert. Oriented to person, place and date Cranial nerves: There is good facial symmetry.The speech is fluent and clear. No aphasia or dysarthria. Fund of knowledge is appropriate. Recent and remote memory are impaired. Attention and concentration are reduced.  Able to name objects and repeat phrases.  Hearing is intact to conversational tone.    Sensation: Sensation is intact to light touch throughout Motor: Strength is at least antigravity x4. Tremors: none  DTR's 2/4 in UE/LE     Movement examination: Tone: There is normal tone in the UE/LE Abnormal movements:  no tremor.  No myoclonus.  No asterixis.   Coordination:  There is no decremation with  RAM's. Normal finger to nose  Gait and Station: The patient has no difficulty arising out of a deep-seated chair without the use of the  hands. The patient's stride length is good.  Gait is cautious and narrow.    Thank you for allowing [place] the opportunity to participate in the care of this nice patient. Please do not hesitate to contact us for any questions or concerns.   Total time spent on today's visit was *** minutes dedicated to this patient today, preparing to see patient, examining the patient, ordering tests and/or medications and counseling the patient, documenting clinical information in the EHR or other health record, independently interpreting results and communicating results to the patient/family, discussing treatment and goals, answering patient's questions and coordinating care.  Cc:  Lajean Manes, MD  Sharene Butters 03/24/2022 2:39 PM

## 2022-03-25 ENCOUNTER — Encounter: Payer: Self-pay | Admitting: Physician Assistant

## 2022-03-25 ENCOUNTER — Ambulatory Visit: Payer: PPO | Admitting: Physician Assistant

## 2022-03-25 VITALS — BP 155/67 | HR 64 | Resp 18 | Ht 74.0 in | Wt 173.0 lb

## 2022-03-25 DIAGNOSIS — F0280 Dementia in other diseases classified elsewhere without behavioral disturbance: Secondary | ICD-10-CM

## 2022-03-25 DIAGNOSIS — F0150 Vascular dementia without behavioral disturbance: Secondary | ICD-10-CM

## 2022-03-25 DIAGNOSIS — G309 Alzheimer's disease, unspecified: Secondary | ICD-10-CM

## 2022-03-25 MED ORDER — RIVASTIGMINE TARTRATE 1.5 MG PO CAPS: ORAL_CAPSULE | ORAL | 1 refills | Status: DC

## 2022-03-25 NOTE — Patient Instructions (Signed)
It was a pleasure to see you today at our office.   Recommendations:  Follow up in 6 months   Memantine 10 mg take 1 tablet at night for 2 weeks then increase to one tab twice daily.Side effects were discussed  Start rivastigmine 1.5 mg for 1 week and increase to 1.5 mg 2 times a day  Neurocognitive testing    Whom to call:  Memory  decline, memory medications: Call our office [phone]   For psychiatric meds, mood meds: Please have your primary care physician manage these medications.   Counseling regarding caregiver distress, including caregiver depression, anxiety and issues regarding community resources, adult day care programs, adult living facilities, or memory care questions:   Feel free to contact Gr[place]is, Social Worker at [phone]   For assessment of decision of mental capacity and competency:  Call Dr. Anthoney Harada, geriatric psychiatrist at [phone]  For guidance in geriatric dementia issues please call [facility] [phone]  For guidance regarding [facility] and if placement were needed at the facility, contact Arnell Asal, Social Worker tel: [phone]  If you have any severe symptoms of a stroke, or other severe issues such as confusion,severe chills or fever, etc call 911 or go to the ER as you may need to be evaluated further   Feel free to visit Facebook page " [email or web address]" for tips of how to care for people with memory problems.       RECOMMENDATIONS FOR ALL PATIENTS WITH MEMORY PROBLEMS: 1. Continue to exercise (Recommend 30 minutes of walking everyday, or 3 hours every week) 2. Increase social interactions - continue going to [place] and enjoy social gatherings with friends and family 3. Eat healthy, avoid fried foods and eat more fruits and vegetables 4. Maintain adequate blood pressure, blood sugar, and blood cholesterol level. Reducing the risk of stroke and cardiovascular disease also helps  promoting better memory. 5. Avoid stressful situations. Live a simple life and avoid aggravations. Organize your time and prepare for the next day in anticipation. 6. Sleep well, avoid any interruptions of sleep and avoid any distractions in the bedroom that may interfere with adequate sleep quality 7. Avoid sugar, avoid sweets as there is a strong link between excessive sugar intake, diabetes, and cognitive impairment We discussed the Mediterranean diet, which has been shown to help patients reduce the risk of progressive memory disorders and reduces cardiovascular risk. This includes eating fish, eat fruits and green leafy vegetables, nuts like almonds and hazelnuts, walnuts, and also use olive oil. Avoid fast foods and fried foods as much as possible. Avoid sweets and sugar as sugar use has been linked to worsening of memory function.  There is always a concern of gradual progression of memory problems. If this is the case, then we may need to adjust level of care according to patient needs. Support, both to the patient and caregiver, should then be put into place.    The Alzheimer's Association is here all day, every day for people facing Alzheimer's disease through our free 24/7 Helpline: [phone]. The Helpline provides reliable information and support to all those who need assistance, such as individuals living with memory loss, Alzheimer's or other dementia, caregivers, health care professionals and the public.  Our highly trained and knowledgeable staff can help you with: Understanding memory loss, dementia and Alzheimer's  Medications and other treatment options  General information about aging and brain health  Skills to provide quality care and to find the  best care from professionals  Legal, financial and living-arrangement decisions Our Helpline also features: Confidential care consultation provided by master's level clinicians who can help with decision-making support, crisis  assistance and education on issues families face every day  Help in a caller's preferred language using our translation service that features more than 200 languages and dialects  Referrals to local community programs, services and ongoing support     FALL PRECAUTIONS: Be cautious when walking. Scan the area for obstacles that may increase the risk of trips and falls. When getting up in the mornings, sit up at the edge of the bed for a few minutes before getting out of bed. Consider elevating the bed at the head end to avoid drop of blood pressure when getting up. Walk always in a well-lit room (use night lights in the walls). Avoid area rugs or power cords from appliances in the middle of the walkways. Use a walker or a cane if necessary and consider physical therapy for balance exercise. Get your eyesight checked regularly.  FINANCIAL OVERSIGHT: Supervision, especially oversight when making financial decisions or transactions is also recommended.  HOME SAFETY: Consider the safety of the kitchen when operating appliances like stoves, microwave oven, and blender. Consider having supervision and share cooking responsibilities until no longer able to participate in those. Accidents with firearms and other hazards in the house should be identified and addressed as well.   ABILITY TO BE LEFT ALONE: If patient is unable to contact 911 operator, consider using LifeLine, or when the need is there, arrange for someone to stay with patients. Smoking is a fire hazard, consider supervision or cessation. Risk of wandering should be assessed by caregiver and if detected at any point, supervision and safe proof recommendations should be instituted.  MEDICATION SUPERVISION: Inability to self-administer medication needs to be constantly addressed. Implement a mechanism to ensure safe administration of the medications.   DRIVING: Regarding driving, in patients with progressive memory problems, driving will be  impaired. We advise to have someone else do the driving if trouble finding directions or if minor accidents are reported. Independent driving assessment is available to determine safety of driving.   If you are interested in the driving assessment, you can contact the following:  [employer] in [place]  [place] [phone] or [phone]      [place] refers to food and lifestyle choices that are based on the traditions of countries located on the [employer]. This way of eating has been shown to help prevent certain conditions and improve outcomes for people who have chronic diseases, like kidney disease and heart disease. What are tips for following this plan? Lifestyle  Cook and eat meals together with your family, when possible. Drink enough fluid to keep your urine clear or pale yellow. Be physically active every day. This includes: Aerobic exercise like running or swimming. Leisure activities like gardening, walking, or housework. Get 7-8 hours of sleep each night. If recommended by your health care provider, drink red wine in moderation. This means 1 glass a day for nonpregnant women and 2 glasses a day for men. A glass of wine equals 5 oz (150 mL). Reading food labels  Check the serving size of packaged foods. For foods such as rice and pasta, the serving size refers to the amount of cooked product, not dry. Check the total fat in packaged foods. Avoid foods that  have saturated fat or trans fats. Check the ingredients list for added sugars, such as corn syrup. Shopping  At the grocery store, buy most of your food from the areas near the walls of the store. This includes: Fresh fruits and vegetables (produce). Grains, beans, nuts, and seeds. Some of these may be available in unpackaged forms or large amounts (in  bulk). Fresh seafood. Poultry and eggs. Low-fat dairy products. Buy whole ingredients instead of prepackaged foods. Buy fresh fruits and vegetables in-season from local farmers markets. Buy frozen fruits and vegetables in resealable bags. If you do not have access to quality fresh seafood, buy precooked frozen shrimp or canned fish, such as tuna, salmon, or sardines. Buy small amounts of raw or cooked vegetables, salads, or olives from the deli or salad bar at your store. Stock your pantry so you always have certain foods on hand, such as olive oil, canned tuna, canned tomatoes, rice, pasta, and beans. Cooking  Cook foods with extra-virgin olive oil instead of using butter or other vegetable oils. Have meat as a side dish, and have vegetables or grains as your main dish. This means having meat in small portions or adding small amounts of meat to foods like pasta or stew. Use beans or vegetables instead of meat in common dishes like chili or lasagna. Experiment with different cooking methods. Try roasting or broiling vegetables instead of steaming or sauteing them. Add frozen vegetables to soups, stews, pasta, or rice. Add nuts or seeds for added healthy fat at each meal. You can add these to yogurt, salads, or vegetable dishes. Marinate fish or vegetables using olive oil, lemon juice, garlic, and fresh herbs. Meal planning  Plan to eat 1 vegetarian meal one day each week. Try to work up to 2 vegetarian meals, if possible. Eat seafood 2 or more times a week. Have healthy snacks readily available, such as: Vegetable sticks with hummus. Greek yogurt. Fruit and nut trail mix. Eat balanced meals throughout the week. This includes: Fruit: 2-3 servings a day Vegetables: 4-5 servings a day Low-fat dairy: 2 servings a day Fish, poultry, or lean meat: 1 serving a day Beans and legumes: 2 or more servings a week Nuts and seeds: 1-2 servings a day Whole grains: 6-8 servings a day Extra-virgin  olive oil: 3-4 servings a day Limit red meat and sweets to only a few servings a month What are my food choices? Mediterranean diet Recommended Grains: Whole-grain pasta. Brown rice. Bulgar wheat. Polenta. Couscous. Whole-wheat bread. [place]. Vegetables: Artichokes. Beets. Broccoli. Cabbage. Carrots. Eggplant. Green beans. Chard. Kale. Spinach. Onions. Leeks. Peas. Squash. Tomatoes. Peppers. Radishes. Fruits: Apples. Apricots. Avocado. Berries. Bananas. Cherries. Dates. Figs. Grapes. Lemons. Melon. Oranges. Peaches. Plums. Pomegranate. Meats and other protein foods: Beans. Almonds. Sunflower seeds. Pine nuts. Peanuts. [place]. Salmon. Scallops. Shrimp. [place]. Tilapia. Clams. Oysters. Eggs. Dairy: Low-fat milk. Cheese. Greek yogurt. Beverages: Water. Red wine. Herbal tea. Fats and oils: Extra virgin olive oil. Avocado oil. Grape seed oil. Sweets and desserts: [place] yogurt with honey. Baked apples. Poached pears. Trail mix. Seasoning and other foods: Basil. Cilantro. Coriander. Cumin. Mint. Parsley. Sage. Rosemary. Tarragon. Garlic. Oregano. Thyme. Pepper. Balsalmic vinegar. Tahini. Hummus. Tomato sauce. Olives. Mushrooms. Limit these Grains: Prepackaged pasta or rice dishes. Prepackaged cereal with added sugar. Vegetables: Deep fried potatoes (french fries). Fruits: Fruit canned in syrup. Meats and other protein foods: Beef. Pork. Lamb. Poultry with skin. Hot dogs. [place] Salines. Dairy: Ice cream. Sour cream. Whole milk. Beverages: Juice. Sugar-sweetened soft drinks. Beer.  Liquor and spirits. Fats and oils: Butter. Canola oil. Vegetable oil. Beef fat (tallow). Lard. Sweets and desserts: Cookies. Cakes. Pies. Candy. Seasoning and other foods: Mayonnaise. Premade sauces and marinades. The items listed may not be a complete list. Talk with your dietitian about what dietary choices are right for you. Summary The Mediterranean diet includes both food and lifestyle choices. Eat a variety of fresh  fruits and vegetables, beans, nuts, seeds, and whole grains. Limit the amount of red meat and sweets that you eat. Talk with your health care provider about whether it is safe for you to drink red wine in moderation. This means 1 glass a day for nonpregnant women and 2 glasses a day for men. A glass of wine equals 5 oz (150 mL). This information is not intended to replace advice given to you by your health care provider. Make sure you discuss any questions you have with your health care provider. Document Released: 02/14/2016 Document Revised: 03/18/2016 Document Reviewed: 02/14/2016 Elsevier Interactive Patient Education  2017 [employer].   We have sent a referral to [facility] for your MRI and they will call you directly to schedule your appointment. They are located at [place]. If you need to contact them directly please call [phone].  Your provider has requested that you have labwork completed today. Please go to [facility] ([id number]) on the second floor of this building before leaving the office today. You do not need to check in. If you are not called within 15 minutes please check with the front desk.

## 2022-03-26 ENCOUNTER — Ambulatory Visit: Payer: Managed Care, Other (non HMO) | Admitting: Podiatry

## 2022-03-26 ENCOUNTER — Encounter: Payer: Self-pay | Admitting: Podiatry

## 2022-03-26 DIAGNOSIS — M79675 Pain in left toe(s): Secondary | ICD-10-CM | POA: Diagnosis not present

## 2022-03-26 DIAGNOSIS — L600 Ingrowing nail: Secondary | ICD-10-CM

## 2022-03-26 DIAGNOSIS — B351 Tinea unguium: Secondary | ICD-10-CM

## 2022-03-27 DIAGNOSIS — G3184 Mild cognitive impairment, so stated: Secondary | ICD-10-CM | POA: Diagnosis not present

## 2022-03-27 DIAGNOSIS — R41841 Cognitive communication deficit: Secondary | ICD-10-CM | POA: Diagnosis not present

## 2022-03-27 NOTE — Progress Notes (Signed)
Subjective:   Patient ID: [name], male   DOB: 86 y.o.   MRN: [id number]   HPI Patient presents stating that he has had concerns with nail brittleness and discoloration and the left big toenail has been split into and it is moderately tender.  He states that it can be bothersome and is not sure if it should be removed or whether it can be checked.  Patient does not smoke likes to be active   Review of Systems  All other systems reviewed and are negative.       Objective:  Physical Exam Vitals and nursing note reviewed.  Constitutional:      Appearance: He is well-developed.  Pulmonary:     Effort: Pulmonary effort is normal.  Musculoskeletal:        General: Normal range of motion.  Skin:    General: Skin is warm.  Neurological:     Mental Status: He is alert.     Neurovascular status found to be intact muscle strength was found to be adequate range of motion adequate.  Patient does have brittleness to all of his nailbeds and the left hallux is split into 2 portions with no current erythema edema or drainage associated with it.  Good digital perfusion noted     Assessment:  Traumatized left hallux nail with discomfort and probable fungal component creating the pathology     Plan:  H&P reviewed condition explained to him and today debrided nailbed no iatrogenic bleeding smoothed it down and then discussed permanent procedure which may be necessary in future.  Patient will be seen back depending on how it grows out and may require other treatment pattern

## 2022-03-28 DIAGNOSIS — E1129 Type 2 diabetes mellitus with other diabetic kidney complication: Secondary | ICD-10-CM | POA: Diagnosis not present

## 2022-03-28 DIAGNOSIS — N1831 Chronic kidney disease, stage 3a: Secondary | ICD-10-CM | POA: Diagnosis not present

## 2022-03-28 DIAGNOSIS — E7800 Pure hypercholesterolemia, unspecified: Secondary | ICD-10-CM | POA: Diagnosis not present

## 2022-03-28 DIAGNOSIS — F325 Major depressive disorder, single episode, in full remission: Secondary | ICD-10-CM | POA: Diagnosis not present

## 2022-03-28 DIAGNOSIS — I129 Hypertensive chronic kidney disease with stage 1 through stage 4 chronic kidney disease, or unspecified chronic kidney disease: Secondary | ICD-10-CM | POA: Diagnosis not present

## 2022-04-08 ENCOUNTER — Other Ambulatory Visit (HOSPITAL_BASED_OUTPATIENT_CLINIC_OR_DEPARTMENT_OTHER): Payer: Self-pay

## 2022-04-08 MED ORDER — FLUAD QUADRIVALENT 0.5 ML IM PRSY
PREFILLED_SYRINGE | INTRAMUSCULAR | 0 refills | Status: DC
  Filled 2022-04-08: qty 0.5, 1d supply, fill #0

## 2022-04-09 DIAGNOSIS — G3184 Mild cognitive impairment, so stated: Secondary | ICD-10-CM | POA: Diagnosis not present

## 2022-04-09 DIAGNOSIS — R41841 Cognitive communication deficit: Secondary | ICD-10-CM | POA: Diagnosis not present

## 2022-04-15 DIAGNOSIS — R2689 Other abnormalities of gait and mobility: Secondary | ICD-10-CM | POA: Diagnosis not present

## 2022-04-15 DIAGNOSIS — R278 Other lack of coordination: Secondary | ICD-10-CM | POA: Diagnosis not present

## 2022-04-15 DIAGNOSIS — R4189 Other symptoms and signs involving cognitive functions and awareness: Secondary | ICD-10-CM | POA: Diagnosis not present

## 2022-04-15 DIAGNOSIS — N39498 Other specified urinary incontinence: Secondary | ICD-10-CM | POA: Diagnosis not present

## 2022-04-15 DIAGNOSIS — R29898 Other symptoms and signs involving the musculoskeletal system: Secondary | ICD-10-CM | POA: Diagnosis not present

## 2022-04-17 DIAGNOSIS — R29898 Other symptoms and signs involving the musculoskeletal system: Secondary | ICD-10-CM | POA: Diagnosis not present

## 2022-04-17 DIAGNOSIS — N39498 Other specified urinary incontinence: Secondary | ICD-10-CM | POA: Diagnosis not present

## 2022-04-17 DIAGNOSIS — R2689 Other abnormalities of gait and mobility: Secondary | ICD-10-CM | POA: Diagnosis not present

## 2022-04-17 DIAGNOSIS — R4189 Other symptoms and signs involving cognitive functions and awareness: Secondary | ICD-10-CM | POA: Diagnosis not present

## 2022-04-17 DIAGNOSIS — R278 Other lack of coordination: Secondary | ICD-10-CM | POA: Diagnosis not present

## 2022-04-22 DIAGNOSIS — R2689 Other abnormalities of gait and mobility: Secondary | ICD-10-CM | POA: Diagnosis not present

## 2022-04-22 DIAGNOSIS — R29898 Other symptoms and signs involving the musculoskeletal system: Secondary | ICD-10-CM | POA: Diagnosis not present

## 2022-04-22 DIAGNOSIS — N39498 Other specified urinary incontinence: Secondary | ICD-10-CM | POA: Diagnosis not present

## 2022-04-22 DIAGNOSIS — R278 Other lack of coordination: Secondary | ICD-10-CM | POA: Diagnosis not present

## 2022-04-22 DIAGNOSIS — R4189 Other symptoms and signs involving cognitive functions and awareness: Secondary | ICD-10-CM | POA: Diagnosis not present

## 2022-04-24 ENCOUNTER — Other Ambulatory Visit: Payer: Self-pay | Admitting: Physician Assistant

## 2022-04-24 DIAGNOSIS — R4189 Other symptoms and signs involving cognitive functions and awareness: Secondary | ICD-10-CM | POA: Diagnosis not present

## 2022-04-24 DIAGNOSIS — R2689 Other abnormalities of gait and mobility: Secondary | ICD-10-CM | POA: Diagnosis not present

## 2022-04-24 DIAGNOSIS — R278 Other lack of coordination: Secondary | ICD-10-CM | POA: Diagnosis not present

## 2022-04-24 DIAGNOSIS — R29898 Other symptoms and signs involving the musculoskeletal system: Secondary | ICD-10-CM | POA: Diagnosis not present

## 2022-04-24 DIAGNOSIS — N39498 Other specified urinary incontinence: Secondary | ICD-10-CM | POA: Diagnosis not present

## 2022-04-29 DIAGNOSIS — R278 Other lack of coordination: Secondary | ICD-10-CM | POA: Diagnosis not present

## 2022-04-29 DIAGNOSIS — N39498 Other specified urinary incontinence: Secondary | ICD-10-CM | POA: Diagnosis not present

## 2022-04-29 DIAGNOSIS — R29898 Other symptoms and signs involving the musculoskeletal system: Secondary | ICD-10-CM | POA: Diagnosis not present

## 2022-04-29 DIAGNOSIS — R2689 Other abnormalities of gait and mobility: Secondary | ICD-10-CM | POA: Diagnosis not present

## 2022-04-29 DIAGNOSIS — R4189 Other symptoms and signs involving cognitive functions and awareness: Secondary | ICD-10-CM | POA: Diagnosis not present

## 2022-05-05 DIAGNOSIS — H52203 Unspecified astigmatism, bilateral: Secondary | ICD-10-CM | POA: Diagnosis not present

## 2022-05-05 DIAGNOSIS — H401131 Primary open-angle glaucoma, bilateral, mild stage: Secondary | ICD-10-CM | POA: Diagnosis not present

## 2022-05-05 DIAGNOSIS — Z961 Presence of intraocular lens: Secondary | ICD-10-CM | POA: Diagnosis not present

## 2022-05-05 DIAGNOSIS — E113293 Type 2 diabetes mellitus with mild nonproliferative diabetic retinopathy without macular edema, bilateral: Secondary | ICD-10-CM | POA: Diagnosis not present

## 2022-05-13 DIAGNOSIS — N39498 Other specified urinary incontinence: Secondary | ICD-10-CM | POA: Diagnosis not present

## 2022-05-13 DIAGNOSIS — R278 Other lack of coordination: Secondary | ICD-10-CM | POA: Diagnosis not present

## 2022-05-13 DIAGNOSIS — R4189 Other symptoms and signs involving cognitive functions and awareness: Secondary | ICD-10-CM | POA: Diagnosis not present

## 2022-05-13 DIAGNOSIS — R2689 Other abnormalities of gait and mobility: Secondary | ICD-10-CM | POA: Diagnosis not present

## 2022-05-13 DIAGNOSIS — R29898 Other symptoms and signs involving the musculoskeletal system: Secondary | ICD-10-CM | POA: Diagnosis not present

## 2022-05-22 DIAGNOSIS — N39498 Other specified urinary incontinence: Secondary | ICD-10-CM | POA: Diagnosis not present

## 2022-05-22 DIAGNOSIS — R29898 Other symptoms and signs involving the musculoskeletal system: Secondary | ICD-10-CM | POA: Diagnosis not present

## 2022-05-22 DIAGNOSIS — R4189 Other symptoms and signs involving cognitive functions and awareness: Secondary | ICD-10-CM | POA: Diagnosis not present

## 2022-05-22 DIAGNOSIS — R278 Other lack of coordination: Secondary | ICD-10-CM | POA: Diagnosis not present

## 2022-05-22 DIAGNOSIS — R2689 Other abnormalities of gait and mobility: Secondary | ICD-10-CM | POA: Diagnosis not present

## 2022-05-26 ENCOUNTER — Other Ambulatory Visit: Payer: Self-pay | Admitting: Physician Assistant

## 2022-06-02 ENCOUNTER — Encounter: Payer: Self-pay | Admitting: Psychology

## 2022-06-02 DIAGNOSIS — G629 Polyneuropathy, unspecified: Secondary | ICD-10-CM | POA: Insufficient documentation

## 2022-06-02 DIAGNOSIS — I2510 Atherosclerotic heart disease of native coronary artery without angina pectoris: Secondary | ICD-10-CM | POA: Insufficient documentation

## 2022-06-02 DIAGNOSIS — I6529 Occlusion and stenosis of unspecified carotid artery: Secondary | ICD-10-CM | POA: Insufficient documentation

## 2022-06-03 ENCOUNTER — Ambulatory Visit: Payer: PPO | Admitting: Psychology

## 2022-06-03 ENCOUNTER — Ambulatory Visit: Payer: PPO

## 2022-06-03 ENCOUNTER — Encounter: Payer: Self-pay | Admitting: Psychology

## 2022-06-03 DIAGNOSIS — I6781 Acute cerebrovascular insufficiency: Secondary | ICD-10-CM

## 2022-06-03 DIAGNOSIS — G301 Alzheimer's disease with late onset: Secondary | ICD-10-CM

## 2022-06-03 DIAGNOSIS — F0280 Dementia in other diseases classified elsewhere without behavioral disturbance: Secondary | ICD-10-CM

## 2022-06-03 DIAGNOSIS — R4189 Other symptoms and signs involving cognitive functions and awareness: Secondary | ICD-10-CM

## 2022-06-03 NOTE — Progress Notes (Signed)
NEUROPSYCHOLOGICAL EVALUATION [facility] [place] [facility] of Neurology  Date of Evaluation: June 03, 2022  Reason for Referral:   [name] is a 86 y.o. right-handed Caucasian male referred by Sharene Butters, PA-C, to characterize his current cognitive functioning and assist with diagnostic clarity and treatment planning in the context of subjective cognitive decline and concerns for an underlying neurodegenerative illness.   Assessment and Plan:   Clinical Impression(s): [name] pattern of performance is suggestive of a profound impairment surrounding expressive language (i.e., sentence repetition, confrontation naming, phonemic and semantic fluency). Additional impairments were exhibited across processing speed, executive functioning, and both encoding (i.e., learning) and delayed retrieval aspects of memory. A further weakness was noted across attention/concentration while performance variability was exhibited across recognition aspects of verbal memory. Performances were appropriate relative to age-matched peers across receptive language and most visuospatial abilities. Regarding day-to-day functioning, [name] has notable assistance with medication management, financial management, and bill paying. He also no longer drives due to cognitive decline and safety concerns. Given evidence for cognitive and functional decline, he meets diagnostic criteria for a Major Neurocognitive Disorder ("dementia") at the present time.  The etiology for his dementia presentation is somewhat unclear. 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 However, he did not exhibit a fully classic Alzheimer's disease pattern across testing. He did not benefit from repeated exposure to information across learning trials and was fully amnestic  (i.e., 0% retention) across list and figure-based memory tasks. However, he retained 100% of information across a story-based task (albeit, his raw score was 2) and, more importantly, performed well across list and story recognition tasks. While this raises concerns for rapid forgetting, there is not current evidence to suggest a consistently profound storage impairment. This could suggest that this illness remains in earlier stages than perhaps what might have been anticipated by his medical team. The vascular contribution is less clear as there was no mention of microvascular ischemia severity by the neuroradiologist who read Mr. Harrell's recent MRI. While there is no stroke history, he does appear to exhibit relatively advanced ischemia by my personal review. While this could be a contributing factor, it would appear secondary to the presence of a neurodegenerative illness.   It is worth highlighting that [name] exhibited greater than anticipated language impairment, especially surrounding word finding and verbal fluency more broadly. Expressive language deficits could reasonably fall in line with expected disease trajectory in an Alzheimer's disease presentation. Outside of this, there remains the possibility of a logopenic primary progressive aphasia (PPA) presentation, given pronounced language impairment with some evidence for memory storage capabilities. Logopenic aphasia presentations commonly present with impairments with word finding, single word retrieval, and sentence repetition, all of which [name] exhibited. This presentation also shares anatomical pathology with Alzheimer's disease, causing the two illnesses to share many clinical features which can make them difficult to differentiate. Alzheimer's disease is a far more common illness when comparing population base rates. However, a logopenic PPA presentation should remain on his differential. While recent neuroimaging suggested  advanced atrophy, the radiologist unfortunately did not provide any information surrounding if this was generalized or worse in any particular brain region, making further diagnostic clarity challenging.   Recommendations: [name] has already been prescribed a medication aimed to address memory loss and concerns surrounding Alzheimer's disease (i.e., both rivastigmine/Exelon and memantine/Namenda can be found on his  medication list). He is encouraged to continue taking this/these medication(s) as prescribed. It is important to highlight that these medications have been shown to slow functional decline in some individuals. There is no current treatment which can stop or reverse cognitive decline when caused by a neurodegenerative illness.   I agree with decisions made surrounding [name] fully abstaining from driving based upon current test results.  It will be important for him to have another person with him when in situations where he may need to process information, weigh the pros and cons of different options, and make decisions, in order to ensure that he fully understands and recalls all information to be considered.  If not already done, [name] and his family may want to discuss his wishes regarding durable power of attorney and medical decision making, so that he can have input into these choices. Additionally, they may wish to discuss future plans for caretaking and seek out community options for in home/residential care should they become necessary.  [name] is encouraged to attend to lifestyle factors for brain health (e.g., regular physical exercise, good nutrition habits, regular participation in cognitively-stimulating activities, and general stress management techniques), which are likely to have benefits for both emotional adjustment and cognition. Optimal control of vascular risk factors (including safe cardiovascular exercise and adherence to dietary  recommendations) is encouraged. Continued participation in activities which provide mental stimulation and social interaction is also recommended.   Important information should be provided to [name] in written format in all instances. This information should be placed in a highly frequented and easily visible location within his home to promote recall. External strategies such as written notes in a consistently used memory journal, visual and nonverbal auditory cues such as a calendar on the refrigerator or appointments with alarm, such as on a cell phone, can also help maximize recall.  To address problems with processing speed, he may wish to consider:   -Ensuring that he is alerted when essential material or instructions are being presented   -Adjusting the speed at which new information is presented    -Allowing for more time in comprehending, processing, and responding in conversation  To address problems with fluctuating attention and executive dysfunction, he may wish to consider:   -Avoiding external distractions when needing to concentrate   -Limiting exposure to fast paced environments with multiple sensory demands   -Writing down complicated information and using checklists   -Attempting and completing one task at a time (i.e., no multi-tasking)   -Verbalizing aloud each step of a task to maintain focus   -Taking frequent breaks during the completion of steps/tasks to avoid fatigue   -Reducing the amount of information considered at one time  Review of Records:   Mr. Prestage was seen by [facility] [name], D.O.) on 10/26/2017 for an evaluation of worsening gait. Briefly, he and his wife began noticing problems with balance about 6 months prior, which had gradually gotten worse. When walking, he reported feeling off-balance. He also described taking shorter steps and dragging his heels (R worse than L). While he has some neuropathic pain in his feet, he denied a  sensation of numbness or difficulty feeling the ground when standing. He has history of low back pain with lumbar radiculopathy, but denied low back or radicular pain in the legs at that time. He denied freezing, tremor, or difficulty swallowing. There was mention of some short-term memory impairment but was independent with ADLs at that time.  He was more recently seen by [facility] Sharene Butters, PA-C) on 12/13/2021 for an evaluation of memory loss. Difficulties were said to be present for the past 5-6 years and have been gradually worsening over time. Examples included misplacing/losing things and not remembering that he has grandchildren (per his daughter). He resides in [place] and has CNAs who assist him for 7-8 hours per day with medication management and other daily living tasks. He does not drive and his daughter manages finances and bill paying. Performance on a brief cognitive screening instrument (MOCA) was 15/30. Ultimately, Mr. Sivley was referred for a comprehensive neuropsychological evaluation to characterize his cognitive abilities and to assist with diagnostic clarity and treatment planning.   Brain MRI on 12/30/2021 revealed advanced atrophy (there was no mention of any lobar predominance versus generalized findings) and chronic small vessel disease (of unspecified severity).   Past Medical History:  Diagnosis Date   CAD (coronary artery disease)    Bare meta stent RCA   Carotid stenosis    a. Carotid US 3/17: bilateral ICA 1-39% >> repeat 2 years   Coronary atherosclerosis of native coronary artery 08/24/2008   S/P PCI   Dementia 12/15/2021   concerns for Alzheimer's disease   Essential hypertension, benign 08/24/2008   Fatigue 08/08/2010   History of nuclear stress test    Myoview 12/16: EF 66%, very mild inferior ischemia; Low Risk   Mixed hyperlipidemia 08/24/2008   Myocardial infarction 10/06/2007   Nonproliferative diabetic retinopathy  08/08/2011   Nuclear sclerosis of both eyes 12/01/2017   Peripheral neuropathy    Pulmonary nodule, right 09/13/2007   Right upper lobe         Sinoatrial node dysfunction 08/24/2008   Chronotropic incompetence   Snoring 09/27/2009   Type II diabetes mellitus 08/08/2011    Past Surgical History:  Procedure Laterality Date   LEFT HEART CATHETERIZATION WITH CORONARY ANGIOGRAM N/A 03/04/2012   Procedure: LEFT HEART CATHETERIZATION WITH CORONARY ANGIOGRAM;  Surgeon: Burnell Blanks, MD;  Location: [facility] CATH LAB;  Service: Cardiovascular;  Laterality: N/A;   TONSILLECTOMY      Current Outpatient Medications:    rivastigmine (EXELON) 1.5 MG capsule, TAKE ONE CAPSULE BY MOUTH ONCE DAILY FOR ONE WEEK THEN increase TO ONE CAPSULE twice A DAY, Disp: 60 capsule, Rfl: 1   allopurinol (ZYLOPRIM) 300 MG tablet, Take 300 mg by mouth daily. , Disp: , Rfl:    amLODipine (NORVASC) 10 MG tablet, TAKE 1 TABLET ONCE DAILY., Disp: 30 tablet, Rfl: 11   aspirin 81 MG tablet, Take 1 tablet (81 mg total) by mouth daily., Disp: 30 tablet, Rfl: 6   BENICAR 20 MG tablet, Take 20 mg by mouth daily. , Disp: , Rfl:    calcium-vitamin D (OSCAL WITH D) 500-200 MG-UNIT per tablet, Take 1 tablet by mouth daily. Vitamin D 3  daily, Disp: , Rfl:    citalopram (CELEXA) 40 MG tablet, Take 40 mg by mouth daily. , Disp: , Rfl:    Coenzyme Q10 (CO Q 10 PO), Take by mouth. 1 tab daily, Disp: , Rfl:    CRESTOR 20 MG tablet, Take 10 mg by mouth daily. , Disp: , Rfl:    diazepam (VALIUM) 5 MG tablet, Take 5 mg by mouth daily as needed for anxiety. , Disp: , Rfl:    diphenoxylate-atropine (LOMOTIL) 2.5-0.025 MG per tablet, Take 1 tablet by mouth 4 (four) times daily as needed for diarrhea or loose stools., Disp: , Rfl:  famotidine (PEPCID) 20 MG tablet, Take 20 mg by mouth 2 (two) times daily., Disp: , Rfl:    fluticasone (FLONASE) 50 MCG/ACT nasal spray, Place 2 sprays into both nostrils as needed for allergies or rhinitis.,  Disp: , Rfl:    hydrochlorothiazide (HYDRODIURIL) 25 MG tablet, TAKE 1 TABLET ONCE DAILY., Disp: 90 tablet, Rfl: 3   influenza vaccine adjuvanted (FLUAD QUADRIVALENT) 0.5 ML injection, Inject into the muscle., Disp: 0.5 mL, Rfl: 0   ipratropium-albuterol (DUONEB) 0.5-2.5 (3) MG/3ML SOLN, Inhale 3 mLs into the lungs every 4 (four) hours as needed., Disp: , Rfl:    JANUVIA 100 MG tablet, Take 100 mg by mouth daily., Disp: , Rfl:    LANTUS SOLOSTAR 100 UNIT/ML injection, Inject 56-58 Units into the skin every morning. , Disp: , Rfl:    memantine (NAMENDA) 10 MG tablet, TAKE ONE TABLET BY MOUTH EVERY MORNING and TAKE ONE TABLET BY MOUTH EVERYDAY AT BEDTIME, Disp: 60 tablet, Rfl: 4   metFORMIN (GLUCOPHAGE-XR) 500 MG 24 hr tablet, Take 500 mg by mouth daily., Disp: , Rfl:    montelukast (SINGULAIR) 10 MG tablet, Take 10 mg by mouth daily., Disp: , Rfl:    nabumetone (RELAFEN) 500 MG tablet, Take 500 mg by mouth 2 (two) times daily as needed for mild pain. , Disp: , Rfl:    nitroGLYCERIN (NITROSTAT) 0.4 MG SL tablet, PLACE 1 TABLET UNDER THE TONGUE EVERY 5 MINUTES AS NEEDED FOR CHEST PAIN., Disp: 25 tablet, Rfl: 5   Saline (SIMPLY SALINE) 0.9 % AERS, Place into the nose., Disp: , Rfl:    spironolactone (ALDACTONE) 25 MG tablet, Take 25 mg by mouth daily., Disp: , Rfl:    vitamin B-12 (CYANOCOBALAMIN) 500 MCG tablet, Take 1 tablet (500 mcg total) by mouth every other day., Disp: 45 tablet, Rfl: 0   zolpidem (AMBIEN) 10 MG tablet, Take 5 mg by mouth at bedtime. For sleep, Disp: , Rfl:   Clinical Interview:   The following information was obtained during a clinical interview with Mr. Raska and his daughter prior to cognitive testing.  Cognitive Symptoms: Decreased short-term memory: Endorsed. Primary examples included trouble recalling details of recent conversations and names, as well as misplacing/losing objects in his environment. His daughter added that he will commonly ask repetitive questions or  make repetitive statements. Memory dysfunction was said to be present for the past several years and seems to have progressively worsened over time.  Decreased long-term memory: Denied. Decreased attention/concentration: Denied. Reduced processing speed: Endorsed. Difficulties with executive functions: Endorsed. He primarily reported some trouble with multi-tasking and organizational abilities. He and his daughter denied any significant personality changes or trouble with impulsivity. His daughter did describe him as being more easily frustrated and having a shorter fuse lately.  Difficulties with emotion regulation: Denied. Difficulties with receptive language: Denied. Difficulties with word finding: Endorsed. Decreased visuoperceptual ability: Denied.  Difficulties completing ADLs: Endorsed. Per medical records, he lives at [place]. However, he has CNAs which spend several hours with him daily who are in charge of medication management. His daughter manages finances and bill paying responsibilities and he no longer drives due to safety and memory concerns.   Additional Medical History: History of traumatic brain injury/concussion: Denied. History of stroke: Denied. History of seizure activity: Denied. History of known exposure to toxins: Denied. Symptoms of chronic pain: Denied. Experience of frequent headaches/migraines: Denied. Frequent instances of dizziness/vertigo: Endorsed. Symptoms of dizziness were said to happen sporadically and are not isolated to  him standing or changing his position quickly.   Sensory changes: He wears glasses with benefit. He reportedly has glaucoma in one eye and diabetic retinopathy in both eyes per his daughter. Other sensory changes/difficulties (e.g., hearing, taste, smell) were denied.  Balance/coordination difficulties: Endorsed. He reported ongoing weakness in his legs with one side not being worse than the other. He uses a rolling  walker for ambulation and maneuvers this device well. No recent falls were reported.  Other motor difficulties: Denied.  Sleep History: Estimated hours obtained each night: 7-8 hours.  Difficulties falling asleep: Denied. Difficulties staying asleep: Denied. Feels rested and refreshed upon awakening: Endorsed.  History of snoring: Endorsed. History of waking up gasping for air: Denied. Witnessed breath cessation while asleep: Denied.  History of vivid dreaming: Denied. Excessive movement while asleep: Denied. Instances of acting out his dreams: Denied.  Psychiatric/Behavioral Health History: Depression: He described his current mood as "normal." While he denied prior mental health concerns, his daughter reminded him that he has taken medications to assist with his mood for many years. Current or remote suicidal ideation, intent, or plan was denied.  Anxiety: Denied. Mania: Denied. Trauma History: Denied. Visual/auditory hallucinations: Denied. Delusional thoughts: Denied.  Tobacco: Denied. Alcohol: He reported very rare alcohol consumption and denied a history of problematic alcohol abuse or dependence.  Recreational drugs: Denied.  Family History: Problem Relation Age of Onset   Dementia Mother        memory loss   Heart failure Mother    Stroke Mother    Heart attack Father 38   Dementia Brother        suspected Lewy body   This information was confirmed by Mr. Ghazi.  Academic/Vocational History: Highest level of educational attainment: 15 years. He graduated from high school and reported completing several years of college. He ultimately estimated accruing credits to count towards three years of college, noting that he commonly attended night school after working during the day. He described himself as an average (A/B) student in academic settings. No relative weaknesses were identified.  History of developmental delay: Denied. History of grade repetition:  Denied. Enrollment in special education courses: Denied. History of LD/ADHD: Denied.  Employment: Retired. 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   Evaluation Results:   Behavioral Observations: Mr. Kutch was accompanied by his daughter, arrived to his appointment on time, and was appropriately dressed and groomed. He appeared alert. He ambulated with the assistance of a rolling walker and maneuvered this device well. No frank instability was observed. Gross motor functioning appeared intact upon informal observation and no abnormal movements (e.g., tremors) were noted. His affect was generally relaxed and positive. Spontaneous speech was fluent and word finding difficulties were not observed during the clinical interview. Thought processes were coherent, organized, and normal in content. Insight into his cognitive difficulties appeared generally adequate. However, he may not have full appreciation into the overall severity of ongoing impairments.   During testing, sustained attention was appropriate. Task engagement was adequate and he persisted when challenged. He was noted to [facility] or stumble over his words from time to time. Word finding difficulties were quite prominent and impacted performance across several tasks including some which do not inherently measure expressive language (e.g., TOPF, Similarities). Overall, Mr. Strahle was cooperative with the clinical interview and subsequent testing procedures.   Adequacy of Effort: The validity of neuropsychological testing is limited by the extent to which the individual  being tested may be assumed to have exerted adequate effort during testing. Mr. Duplantis expressed his intention to perform to the best of his abilities and exhibited adequate task engagement and persistence. Scores across stand-alone and embedded performance validity measures were variable. However, below expectation  performances are believed to be due to true cognitive impairment rather than poor engagement or attempts to perform poorly. As such, the results of the current evaluation are believed to be a generally valid representation of Mr. Arroyo's current cognitive functioning.  Test Results: Mr. Demirjian was poorly oriented at the time of the current evaluation. He was unable to state his address. He also was unable to state the current year ("2004"), date, day of the week, or time.   Intellectual abilities based upon educational and vocational attainment were estimated to be in the average range. Premorbid abilities were estimated to be within the below average range based upon a single-word reading test.   Processing speed was variable but primarily scored within the exceptionally low normative range. Basic attention was well below average to below average. More complex attention (e.g., working memory) was also well below average to below average. Executive functioning was exceptionally low to below average. He did perform in the average range across a task assessing safety and judgment.  Assessed receptive language abilities were above average. Likewise, Mr. Kearse did not exhibit any difficulties comprehending task instructions and answered all questions asked of him appropriately. Assessed expressive language (e.g., sentence repetition, verbal fluency, and confrontation naming) was exceptionally low to well below average.     Assessed visuospatial/visuoconstructional abilities were largely average to above average. Points were lost on his drawing of a complex figure due to numerous mild visual distortions and one internal aspect being omitted entirely.    Learning (i.e., encoding) of novel verbal information was exceptionally low. Spontaneous delayed recall (i.e., retrieval) of previously learned information was exceptionally low to well below average. Retention rates were 100% (raw score of 2)  across a story learning task, 0% across a list learning task, and 0% across a figure drawing task. Performance across recognition tasks was variable, ranging from the exceptionally low to average normative ranges, suggesting some limited evidence for information consolidation.   Results of emotional screening instruments suggested that recent symptoms of generalized anxiety were in the minimal range, while symptoms of depression were within normal limits. A screening instrument assessing recent sleep quality suggested the presence of minimal sleep dysfunction.  Tables of Scores:   Note: This summary of test scores accompanies the interpretive report and should not be considered in isolation without reference to the appropriate sections in the text. Descriptors are based on appropriate normative data and may be adjusted based on clinical judgment. Terms such as "Within Normal Limits" and "Outside Normal Limits" are used when a more specific description of the test score cannot be determined.       Percentile - Normative Descriptor > 98 - Exceptionally High 91-97 - Well Above Average 75-90 - Above Average 25-74 - Average 9-24 - Below Average 2-8 - Well Below Average < 2 - Exceptionally Low       Validity:   DESCRIPTOR       Dot Counting Test: --- --- Within Normal Limits  RBANS Effort Index: --- --- Outside Normal Limits  WAIS-IV Reliable Digit Span: --- --- Outside Normal Limits       Orientation:      Raw Score Percentile   NAB Orientation, Form 1 21/29 --- ---  Cognitive Screening:      Raw Score Percentile   SLUMS: 15/30 --- ---       RBANS, Form A: Standard Score/ Scaled Score Percentile   Total Score 53 <1 Exceptionally Low  Immediate Memory 44 <1 Exceptionally Low    List Learning 2 <1 Exceptionally Low    Story Memory 1 <1 Exceptionally Low  Visuospatial/Constructional 92 30 Average    Figure Copy 4 2 Well Below Average    Line Orientation 19/20 >75 Above Average   Language 54 <1 Exceptionally Low    Picture Naming 7/10 <2 Exceptionally Low    Semantic Fluency 1 <1 Exceptionally Low  Attention 60 <1 Exceptionally Low    Digit Span 5 5 Well Below Average    Coding 3 1 Exceptionally Low  Delayed Memory 60 <1 Exceptionally Low    List Recall 0/10 <2 Exceptionally Low    List Recognition 17/20 10-16 Below Average    Story Recall 4 2 Well Below Average    Story Recognition 10/12 53-67 Average    Figure Recall 1 <1 Exceptionally Low    Figure Recognition 1/8 <1 Exceptionally Low        Intellectual Functioning:      Standard Score Percentile   Test of Premorbid Functioning: 86 18 Below Average       Attention/Executive Function:     Trail Making Test (TMT): Raw Score (Scaled Score) Percentile     Part A 27 secs.,  0 errors (15) 95 Well Above Average    Part B Discontinued  --- Impaired  *Based on [employer] Older Normative Studies (MOANS)           Scaled Score Percentile   WAIS-IV Digit Span: 4 2 Well Below Average    Forward 7 16 Below Average    Backward 6 9 Below Average    Sequencing 5 5 Well Below Average        Scaled Score Percentile   WAIS-IV Similarities: 6 9 Below Average       D-KEFS Color-Word Interference Test: Raw Score (Scaled Score) Percentile     Color Naming 53 secs. (3) 1 Exceptionally Low    Word Reading 42 secs. (2) <1 Exceptionally Low    Inhibition 147 secs. (3) 1 Exceptionally Low      Total Errors 8 errors (6) 9 Below Average    Inhibition/Switching 180 secs. (1) <1 Exceptionally Low      Total Errors 4 errors (10) 50 Average       NAB Executive Functions Module, Form 1: T Score Percentile     Judgment 45 31 Average       Language:      Raw Score Percentile   Sentence Repetition: 11/22 1 Exceptionally Low       Verbal Fluency Test: Raw Score (Scaled Score) Percentile     Phonemic Fluency (CFL) 9 (4) 2 Well Below Average    Category Fluency 6 (2) <1 Exceptionally Low  *Based on [employer] Older Normative  Studies (MOANS)          NAB Language Module, Form 1: T Score Percentile     Auditory Comprehension 58 79 Above Average    Naming 12/31 (19) <1 Exceptionally Low       Visuospatial/Visuoconstruction:      Raw Score Percentile   Clock Drawing: 10/10 --- Within Normal Limits        Scaled Score Percentile   WAIS-IV Block Design: 8 25 Average  Mood and Personality:      Raw Score Percentile   PROMIS Depression Questionnaire: 13 --- None to Slight  PROMIS Anxiety Questionnaire: 7 --- None to Slight       Additional Questionnaires:      Raw Score Percentile   PROMIS Sleep Disturbance Questionnaire: 15 --- None to Slight   Informed Consent and Coding/Compliance:   The current evaluation represents a clinical evaluation for the purposes previously outlined by the referral source and is in no way reflective of a forensic evaluation.   Mr. Bellew was provided with a verbal description of the nature and purpose of the present neuropsychological evaluation. Also reviewed were the foreseeable risks and/or discomforts and benefits of the procedure, limits of confidentiality, and mandatory reporting requirements of this provider. The patient was given the opportunity to ask questions and receive answers about the evaluation. Oral consent to participate was provided by the patient.   This evaluation was conducted by Christia Reading, Ph.D., ABPP-CN, board certified clinical neuropsychologist. Mr. Hehir completed a clinical interview with Dr. Melvyn Novas, billed as one unit [id number], and 120 minutes of cognitive testing and scoring, billed as one unit [id number] and three additional units [id number]. Psychometrist Cruzita Lederer, B.S., assisted Dr. Melvyn Novas with test administration and scoring procedures. As a separate and discrete service, Dr. Melvyn Novas spent a total of 160 minutes in interpretation and report writing billed as one unit [phone] and two units 96133.

## 2022-06-03 NOTE — Progress Notes (Signed)
   Psychometrician Note   Cognitive testing was administered to [name] by Cruzita Lederer, B.S. (psychometrist) under the supervision of Dr. Christia Reading, Ph.D., licensed psychologist on 06/03/2022. [name] did not appear overtly distressed by the testing session per behavioral observation or responses across self-report questionnaires. Rest breaks were offered.    The battery of tests administered was selected by Dr. Christia Reading, Ph.D. with consideration to [name]'s current level of functioning, the nature of his symptoms, emotional and behavioral responses during interview, level of literacy, observed level of motivation/effort, and the nature of the referral question. This battery was communicated to the psychometrist. Communication between Dr. Christia Reading, Ph.D. and the psychometrist was ongoing throughout the evaluation and Dr. Christia Reading, Ph.D. was immediately accessible at all times. Dr. Christia Reading, Ph.D. provided supervision to the psychometrist on the date of this service to the extent necessary to assure the quality of all services provided.    [name] will return within approximately 1-2 weeks for an interactive feedback session with Dr. [name] at which time his test performances, clinical impressions, and treatment recommendations will be reviewed in detail. [name] understands he can contact our office should he require our assistance before this time.  A total of 120 minutes of billable time were spent face-to-face with [name] by the psychometrist. This includes both test administration and scoring time. Billing for these services is reflected in the clinical report generated by Dr. Christia Reading, Ph.D.  This note reflects time spent with the psychometrician and does not include test scores or any clinical interpretations made by Dr. [name]. The full report will follow in a separate note.

## 2022-06-10 ENCOUNTER — Ambulatory Visit (INDEPENDENT_AMBULATORY_CARE_PROVIDER_SITE_OTHER): Payer: PPO | Admitting: Psychology

## 2022-06-10 DIAGNOSIS — G301 Alzheimer's disease with late onset: Secondary | ICD-10-CM

## 2022-06-10 DIAGNOSIS — F0280 Dementia in other diseases classified elsewhere without behavioral disturbance: Secondary | ICD-10-CM | POA: Diagnosis not present

## 2022-06-10 NOTE — Progress Notes (Signed)
   Neuropsychology Feedback Session [name]. [facility] of Neurology  Reason for Referral:   [name] is a 86 y.o. right-handed Caucasian male referred by Sharene Butters, PA-C, to characterize his current cognitive functioning and assist with diagnostic clarity and treatment planning in the context of subjective cognitive decline and concerns for an underlying neurodegenerative illness.   Feedback:   [name] completed a comprehensive neuropsychological evaluation on 06/03/2022. Please refer to that encounter for the full report and recommendations. Briefly, results suggested a profound impairment surrounding expressive language (i.e., sentence repetition, confrontation naming, phonemic and semantic fluency). Additional impairments were exhibited across processing speed, executive functioning, and both encoding (i.e., learning) and delayed retrieval aspects of memory. A further weakness was noted across attention/concentration while performance variability was exhibited across recognition aspects of verbal memory. Concerns surrounding underlying Alzheimer's disease are reasonable. However, he did not exhibit a fully classic Alzheimer's disease pattern across testing. He did not benefit from repeated exposure to information across learning trials and was fully amnestic (i.e., 0% retention) across list and figure-based memory tasks. However, he retained 100% of information across a story-based task (albeit, his raw score was 2) and, more importantly, performed well across list and story recognition tasks. While this raises concerns for rapid forgetting, there is not current evidence to suggest a consistently profound storage impairment. This could suggest that this illness remains in earlier stages than perhaps what might have been anticipated by his medical team. The vascular contribution is less clear as there was no mention of microvascular ischemia severity by  the neuroradiologist who read [name] recent MRI. While there is no stroke history, he does appear to exhibit relatively advanced ischemia by my personal review. While this could be a contributing factor, it would appear secondary to the presence of a neurodegenerative illness.   [name] was accompanied by his daughter during the current feedback session. Content of the current session focused on the results of his neuropsychological evaluation. [name] was given the opportunity to ask questions and his questions were answered. He was encouraged to reach out should additional questions arise. A copy of his report was provided at the conclusion of the visit.      30 minutes were spent conducting the current feedback session with [name], billed as one unit [phone].

## 2022-06-17 DIAGNOSIS — N39498 Other specified urinary incontinence: Secondary | ICD-10-CM | POA: Diagnosis not present

## 2022-06-17 DIAGNOSIS — R29898 Other symptoms and signs involving the musculoskeletal system: Secondary | ICD-10-CM | POA: Diagnosis not present

## 2022-06-17 DIAGNOSIS — R4189 Other symptoms and signs involving cognitive functions and awareness: Secondary | ICD-10-CM | POA: Diagnosis not present

## 2022-06-17 DIAGNOSIS — R278 Other lack of coordination: Secondary | ICD-10-CM | POA: Diagnosis not present

## 2022-06-17 DIAGNOSIS — R2689 Other abnormalities of gait and mobility: Secondary | ICD-10-CM | POA: Diagnosis not present

## 2022-06-25 ENCOUNTER — Other Ambulatory Visit: Payer: Self-pay | Admitting: Physician Assistant

## 2022-06-26 DIAGNOSIS — R29898 Other symptoms and signs involving the musculoskeletal system: Secondary | ICD-10-CM | POA: Diagnosis not present

## 2022-06-26 DIAGNOSIS — R2689 Other abnormalities of gait and mobility: Secondary | ICD-10-CM | POA: Diagnosis not present

## 2022-06-26 DIAGNOSIS — R4189 Other symptoms and signs involving cognitive functions and awareness: Secondary | ICD-10-CM | POA: Diagnosis not present

## 2022-06-26 DIAGNOSIS — N39498 Other specified urinary incontinence: Secondary | ICD-10-CM | POA: Diagnosis not present

## 2022-06-26 DIAGNOSIS — R278 Other lack of coordination: Secondary | ICD-10-CM | POA: Diagnosis not present

## 2022-08-04 DIAGNOSIS — H401112 Primary open-angle glaucoma, right eye, moderate stage: Secondary | ICD-10-CM | POA: Diagnosis not present

## 2022-08-12 ENCOUNTER — Encounter: Payer: Self-pay | Admitting: Internal Medicine

## 2022-08-12 ENCOUNTER — Non-Acute Institutional Stay: Payer: PPO | Admitting: Internal Medicine

## 2022-08-12 VITALS — BP 152/74 | HR 63 | Temp 97.6°F | Resp 18 | Ht 74.0 in | Wt 174.6 lb

## 2022-08-12 DIAGNOSIS — R159 Full incontinence of feces: Secondary | ICD-10-CM

## 2022-08-12 DIAGNOSIS — R2681 Unsteadiness on feet: Secondary | ICD-10-CM

## 2022-08-12 DIAGNOSIS — E113293 Type 2 diabetes mellitus with mild nonproliferative diabetic retinopathy without macular edema, bilateral: Secondary | ICD-10-CM | POA: Diagnosis not present

## 2022-08-12 DIAGNOSIS — I2510 Atherosclerotic heart disease of native coronary artery without angina pectoris: Secondary | ICD-10-CM

## 2022-08-12 DIAGNOSIS — G301 Alzheimer's disease with late onset: Secondary | ICD-10-CM | POA: Diagnosis not present

## 2022-08-12 DIAGNOSIS — I10 Essential (primary) hypertension: Secondary | ICD-10-CM

## 2022-08-12 DIAGNOSIS — M791 Myalgia, unspecified site: Secondary | ICD-10-CM

## 2022-08-12 DIAGNOSIS — E785 Hyperlipidemia, unspecified: Secondary | ICD-10-CM

## 2022-08-12 DIAGNOSIS — E113299 Type 2 diabetes mellitus with mild nonproliferative diabetic retinopathy without macular edema, unspecified eye: Secondary | ICD-10-CM

## 2022-08-12 DIAGNOSIS — F02A0 Dementia in other diseases classified elsewhere, mild, without behavioral disturbance, psychotic disturbance, mood disturbance, and anxiety: Secondary | ICD-10-CM

## 2022-08-12 DIAGNOSIS — Z794 Long term (current) use of insulin: Secondary | ICD-10-CM

## 2022-08-12 DIAGNOSIS — R32 Unspecified urinary incontinence: Secondary | ICD-10-CM

## 2022-08-12 NOTE — Progress Notes (Signed)
Location:  [place] of Service:  Clinic (12)  Provider:   Code Status:  Goals of Care:     08/12/2022    2:44 PM  Advanced Directives  Does Patient Have a Medical Advance Directive? Yes  Type of Paramedic of [place];Living will  Copy of [place] in Chart? No - copy requested     Chief Complaint  Patient presents with  . New Patient (Initial Visit)    Patient is here to establish care.    HPI: Patient is a 87 y.o. male seen today for medical management of chronic diseases.    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 Needs help now with Med management, and ADLS Both Uribn=nary and Bowel Incointinence Refuses to move to Skilled care Walks with his walker Has had some falls not sure how No Injuries oer fdaughter Last MMSE poer Neurology was 15/30 on 12/17/21 CAD S/P Stent placement No Active symptoms HLD, Gait Instability Using [facility] rnow Peripheral Neuropathy, Anemia, OSA falied CPAP  No Acute issues Doe scomlain of some Muscle stifness and pain    Past Medical History:  Diagnosis Date  . CAD (coronary artery disease)    Bare meta stent RCA  . Carotid stenosis    a. Carotid US 3/17: bilateral ICA 1-39% >> repeat 2 years  . Coronary atherosclerosis of native coronary artery 08/24/2008   S/P PCI  . Dementia 12/15/2021   concerns for Alzheimer's disease  . Essential hypertension, benign 08/24/2008  . Fatigue 08/08/2010  . History of nuclear stress test    Myoview 12/16: EF 66%, very mild inferior ischemia; Low Risk  . Mixed hyperlipidemia 08/24/2008  . Myocardial infarction 10/06/2007  .  Nonproliferative diabetic retinopathy 08/08/2011  . Nuclear sclerosis of both eyes 12/01/2017  . Peripheral neuropathy   . Pulmonary nodule, right 09/13/2007   Right upper lobe        . Sinoatrial node dysfunction 08/24/2008   Chronotropic incompetence  . Snoring 09/27/2009  . Type II diabetes mellitus 08/08/2011    Past Surgical History:  Procedure Laterality Date  . LEFT HEART CATHETERIZATION WITH CORONARY ANGIOGRAM N/A 03/04/2012   Procedure: LEFT HEART CATHETERIZATION WITH CORONARY ANGIOGRAM;  Surgeon: Burnell Blanks, MD;  Location: [facility] C[facility] LAB;  Service: Cardiovascular;  Laterality: N/A;  . TONSILLECTOMY      Allergies  Allergen Reactions  . Ace Inhibitors   . Alpha Lipoic Acid [Lipoic Acid]     Upset Stomach  . Invokana [Canagliflozin]   . Heparin Hives  . Isosorbide Mononitrate [Isosorbide Dinitrate Er] Hives  . Penicillins Hives  . Sulfonamide Derivatives Hives    Outpatient Encounter Medications as of 08/12/2022  Medication Sig  . amLODipine (NORVASC) 10 MG tablet TAKE 1 TABLET ONCE DAILY.  . citalopram (CELEXA) 40 MG tablet Take 40 mg by mouth daily.   . famotidine (PEPCID) 20 MG tablet Take 20 mg by mouth 2 (two) times daily.  . ferrous sulfate 325 (65 FE) MG EC tablet Take 325 mg by mouth daily.  . hydrochlorothiazide (HYDRODIURIL) 25 MG tablet TAKE 1 TABLET ONCE DAILY.  Marland Kitchen JANUVIA 100 MG tablet Take  100 mg by mouth daily.  . memantine (NAMENDA) 10 MG tablet TAKE ONE TABLET BY MOUTH EVERY MORNING and TAKE ONE TABLET BY MOUTH EVERYDAY AT BEDTIME  . metFORMIN (GLUCOPHAGE-XR) 500 MG 24 hr tablet Take 500 mg by mouth daily.  . rivastigmine (EXELON) 1.5 MG capsule TAKE ONE CAPSULE BY MOUTH TWICE DAILY  . spironolactone (ALDACTONE) 25 MG tablet Take 25 mg by mouth daily.  . vitamin B-12 (CYANOCOBALAMIN) 500 MCG tablet Take 1 tablet (500 mcg total) by mouth every other day.  . zolpidem (AMBIEN) 10 MG tablet Take 5 mg by mouth at bedtime. For sleep  . allopurinol  (ZYLOPRIM) 300 MG tablet Take 300 mg by mouth daily.  (Patient not taking: Reported on 08/12/2022)  . aspirin 81 MG tablet Take 1 tablet (81 mg total) by mouth daily. (Patient not taking: Reported on 08/12/2022)  . atorvastatin (LIPITOR) 10 MG tablet Take 10 mg by mouth daily. (Patient not taking: Reported on 08/12/2022)  . BENICAR 20 MG tablet Take 20 mg by mouth daily.  (Patient not taking: Reported on 08/12/2022)  . calcium-vitamin D (OSCAL WITH D) 500-200 MG-UNIT per tablet Take 1 tablet by mouth daily. Vitamin D 3  daily (Patient not taking: Reported on 08/12/2022)  . Coenzyme Q10 (CO Q 10 PO) Take by mouth. 1 tab daily (Patient not taking: Reported on 08/12/2022)  . CRESTOR 20 MG tablet Take 10 mg by mouth daily.  (Patient not taking: Reported on 08/12/2022)  . diazepam (VALIUM) 5 MG tablet Take 5 mg by mouth daily as needed for anxiety.  (Patient not taking: Reported on 08/12/2022)  . diphenoxylate-atropine (LOMOTIL) 2.5-0.025 MG per tablet Take 1 tablet by mouth 4 (four) times daily as needed for diarrhea or loose stools. (Patient not taking: Reported on 08/12/2022)  . fluticasone (FLONASE) 50 MCG/ACT nasal spray Place 2 sprays into both nostrils as needed for allergies or rhinitis. (Patient not taking: Reported on 08/12/2022)  . influenza vaccine adjuvanted (FLUAD QUADRIVALENT) 0.5 ML injection Inject into the muscle. (Patient not taking: Reported on 08/12/2022)  . ipratropium-albuterol (DUONEB) 0.5-2.5 (3) MG/3ML SOLN Inhale 3 mLs into the lungs every 4 (four) hours as needed. (Patient not taking: Reported on 08/12/2022)  . LANTUS SOLOSTAR 100 UNIT/ML injection Inject 56-58 Units into the skin every morning.  (Patient not taking: Reported on 08/12/2022)  . montelukast (SINGULAIR) 10 MG tablet Take 10 mg by mouth daily. (Patient not taking: Reported on 08/12/2022)  . nabumetone (RELAFEN) 500 MG tablet Take 500 mg by mouth 2 (two) times daily as needed for mild pain.  (Patient not taking: Reported on 08/12/2022)  .  nitroGLYCERIN (NITROSTAT) 0.4 MG SL tablet PLACE 1 TABLET UNDER THE TONGUE EVERY 5 MINUTES AS NEEDED FOR CHEST PAIN. (Patient not taking: Reported on 08/12/2022)  . Saline (SIMPLY SALINE) 0.9 % AERS Place into the nose. (Patient not taking: Reported on 08/12/2022)   No facility-administered encounter medications on file as of 08/12/2022.    Review of Systems:  Review of Systems  Constitutional:  Negative for activity change, appetite change and unexpected weight change.  HENT: Negative.    Respiratory:  Negative for cough and shortness of breath.   Cardiovascular:  Negative for leg swelling.  Gastrointestinal:  Negative for constipation.  Genitourinary:  Negative for frequency.  Musculoskeletal:  Positive for gait problem and myalgias. Negative for arthralgias.  Skin: Negative.  Negative for rash.  Neurological:  Negative for dizziness and weakness.  Psychiatric/Behavioral:  Positive for confusion. Negative for sleep disturbance.  All other systems reviewed and are negative.   Health Maintenance  Topic Date Due  . FOOT EXAM  Never done  . Diabetic kidney evaluation - Urine ACR  Never done  . Zoster Vaccines- Shingrix (1 of 2) 08/22/1954  . Pneumonia Vaccine 40+ Years old (2 - PCV) 09/07/2007  . HEMOGLOBIN A1C  05/20/2008  . Diabetic kidney evaluation - eGFR measurement  04/09/2013  . DTaP/Tdap/Td (2 - Td or Tdap) 07/07/2020  . COVID-19 Vaccine (2 - Moderna risk series) 08/28/2022 (Originally 06/04/2022)  . Medicare Annual Wellness (AWV)  12/07/2022  . OPHTHALMOLOGY EXAM  07/30/2023  . INFLUENZA VACCINE  Completed  . HPV VACCINES  Aged Out    Physical Exam: Vitals:   08/12/22 1449  BP: (!) 152/74  Pulse: 63  Resp: 18  Temp: 97.6 F (36.4 C)  TempSrc: Temporal  SpO2: 99%  Weight: 174 lb 9.6 oz (79.2 kg)  Height: '6\' 2"'$  (1.88 m)   Body mass index is 22.42 kg/m. Physical Exam Vitals reviewed.  Constitutional:      Appearance: Normal appearance.  HENT:     Head:  Normocephalic.     Right Ear: Tympanic membrane normal.     Left Ear: Tympanic membrane normal.     Nose: Nose normal.     Mouth/Throat:     Mouth: Mucous membranes are moist.     Pharynx: Oropharynx is clear.  Eyes:     Pupils: Pupils are equal, round, and reactive to light.  Cardiovascular:     Rate and Rhythm: Normal rate and regular rhythm.     Pulses: Normal pulses.     Heart sounds: No murmur heard. Pulmonary:     Effort: Pulmonary effort is normal. No respiratory distress.     Breath sounds: Normal breath sounds. No rales.  Abdominal:     General: Abdomen is flat. Bowel sounds are normal.     Palpations: Abdomen is soft.  Musculoskeletal:        General: No swelling.     Cervical back: Neck supple.  Skin:    General: Skin is warm.  Neurological:     Mental Status: He is alert.     Comments: Gait not stable without Walker Takes small steps Repeats Himself a lot No APhasia  Psychiatric:        Mood and Affect: Mood normal.        Thought Content: Thought content normal.   .mms  Labs reviewed: Basic Metabolic Panel: Recent Labs    12/13/21 1447  TSH 1.18   Liver Function Tests: No results for input(s): "AST", "ALT", "ALKPHOS", "BILITOT", "PROT", "ALBUMIN" in the last 8760 hours. No results for input(s): "LIPASE", "AMYLASE" in the last 8760 hours. No results for input(s): "AMMONIA" in the last 8760 hours. CBC: No results for input(s): "WBC", "NEUTROABS", "HGB", "HCT", "MCV", "PLT" in the last 8760 hours. Lipid Panel: No results for input(s): "CHOL", "HDL", "LDLCALC", "TRIG", "CHOLHDL", "LDLDIRECT" in the last 8760 hours. Lab Results  Component Value Date   HGBA1C 6.4 (H) 11/18/2007    Procedures since last visit: No results found.  Assessment/Plan 1. 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 Mild late onset Alzheimer's dementia without behavioral disturbance, psychotic disturbance, mood disturbance, or anxiety (HCC) On Exelon, and  Namenda Needs Skilled level care with ADLS care Has CMAS at home helping High Risk of fall  3. Essential hypertension, benign Taking Amlodipine but not sure if taking Benicar 2 diuretic HCTZ and Aldactone 4. Coronary artery disease involving native coronary artery of native heart without angina pectoris H/o Stent Not on aspirin aor statin Check Lipid Needs follow up with Cardiology  5. Hyperlipidemia, unspecified hyperlipidemia type Check Lipids  6. Nonproliferative diabetic retinopathy Follows with Dr Sindy Messing  7. Urinary incontinence, unspecified type   8. Incontinence of feces, unspecified fecal incontinence type Needs help with CMA  9. Gait instability Walks with his walker Has fallen not sure how Needs to be rmeinded of his walker  10. Muscular pain Check CK 11   Labs/tests ordered:  CBC,CMP,TSH,Lipid panel, A1C Next appt:  Visit date not found

## 2022-08-19 LAB — BASIC METABOLIC PANEL
BUN: 27 — AB (ref 4–21)
Chloride: 101 (ref 99–108)
Creatinine: 1.3 (ref 0.6–1.3)
Glucose: 83
Potassium: 4.5 mEq/L (ref 3.5–5.1)
Sodium: 138 (ref 137–147)

## 2022-08-19 LAB — COMPREHENSIVE METABOLIC PANEL
Albumin: 4 (ref 3.5–5.0)
Calcium: 9.1 (ref 8.7–10.7)
Globulin: 2.2
eGFR: 55

## 2022-08-19 LAB — CBC AND DIFFERENTIAL
HCT: 37 — AB (ref 41–53)
Hemoglobin: 12.5 — AB (ref 13.5–17.5)
Platelets: 261 10*3/uL (ref 150–400)
WBC: 6

## 2022-08-19 LAB — CBC: RBC: 3.91 (ref 3.87–5.11)

## 2022-08-19 LAB — HEPATIC FUNCTION PANEL
ALT: 22 U/L (ref 10–40)
AST: 27 (ref 14–40)
Bilirubin, Total: 0.8

## 2022-08-19 LAB — LIPID PANEL
Cholesterol: 136 (ref 0–200)
HDL: 37 (ref 35–70)
LDL Cholesterol: 80
Triglycerides: 96 (ref 40–160)

## 2022-08-19 LAB — HEMOGLOBIN A1C: Hemoglobin A1C: 7.7

## 2022-08-19 LAB — TSH: TSH: 2.58 (ref 0.41–5.90)

## 2022-09-23 ENCOUNTER — Ambulatory Visit (INDEPENDENT_AMBULATORY_CARE_PROVIDER_SITE_OTHER): Payer: PPO | Admitting: Physician Assistant

## 2022-09-23 ENCOUNTER — Encounter: Payer: Self-pay | Admitting: Physician Assistant

## 2022-09-23 VITALS — BP 163/71 | HR 64 | Resp 20 | Ht 74.0 in | Wt 178.0 lb

## 2022-09-23 DIAGNOSIS — F0280 Dementia in other diseases classified elsewhere without behavioral disturbance: Secondary | ICD-10-CM

## 2022-09-23 DIAGNOSIS — G301 Alzheimer's disease with late onset: Secondary | ICD-10-CM

## 2022-09-23 NOTE — Patient Instructions (Signed)
It was a pleasure to see you today at our office.   Recommendations:  Follow up in 6 months   Memantine 10 mg  twice daily.Side effects were discussed   Continue rivastigmine 1.5 mg2 times a day    Whom to call:  Memory  decline, memory medications: Call our office [phone]   For psychiatric meds, mood meds: Please have your primary care physician manage these medications.   Counseling regarding caregiver distress, including caregiver depression, anxiety and issues regarding community resources, adult day care programs, adult living facilities, or memory care questions:   Feel free to contact [place], Social Worker at [phone]   For assessment of decision of mental capacity and competency:  Call Dr. Anthoney Harada, geriatric psychiatrist at [phone]  For guidance in geriatric dementia issues please call [facility] [phone]  For guidance regarding [facility] and if placement were needed at the facility, contact Arnell Asal, Social Worker tel: [phone]  If you have any severe symptoms of a stroke, or other severe issues such as confusion,severe chills or fever, etc call 911 or go to the ER as you may need to be evaluated further   Feel free to visit Facebook page " [email or web address]" for tips of how to care for people with memory problems.       RECOMMENDATIONS FOR ALL PATIENTS WITH MEMORY PROBLEMS: 1. Continue to exercise (Recommend 30 minutes of walking everyday, or 3 hours every week) 2. Increase social interactions - continue going to [place] and enjoy social gatherings with friends and family 3. Eat healthy, avoid fried foods and eat more fruits and vegetables 4. Maintain adequate blood pressure, blood sugar, and blood cholesterol level. Reducing the risk of stroke and cardiovascular disease also helps promoting better memory. 5. Avoid stressful situations. Live a simple life and avoid aggravations. Organize your time  and prepare for the next day in anticipation. 6. Sleep well, avoid any interruptions of sleep and avoid any distractions in the bedroom that may interfere with adequate sleep quality 7. Avoid sugar, avoid sweets as there is a strong link between excessive sugar intake, diabetes, and cognitive impairment We discussed the Mediterranean diet, which has been shown to help patients reduce the risk of progressive memory disorders and reduces cardiovascular risk. This includes eating fish, eat fruits and green leafy vegetables, nuts like almonds and hazelnuts, walnuts, and also use olive oil. Avoid fast foods and fried foods as much as possible. Avoid sweets and sugar as sugar use has been linked to worsening of memory function.  There is always a concern of gradual progression of memory problems. If this is the case, then we may need to adjust level of care according to patient needs. Support, both to the patient and caregiver, should then be put into place.    The Alzheimer[employer] Association is here all day, every day for people facing Alzheimer's disease through our free 24/7 Helpline: 3154412231. The Helpline provides reliable information and support to all those who need assistance, such as individuals living with memory loss, Alzheimer's or other dementia, caregivers, health care professionals and the public.  Our highly trained and knowledgeable staff can help you with: Understanding memory loss, dementia and Alzheimer's  Medications and other treatment options  General information about aging and brain health  Skills to provide quality care and to find the best care from professionals  Legal, financial and living-arrangement decisions Our Helpline also features: Confidential care consultation provided by master's level clinicians who  can help with decision-making support, crisis assistance and education on issues families face every day  Help in a caller's preferred language using our translation  service that features more than 200 languages and dialects  Referrals to local community programs, services and ongoing support     FALL PRECAUTIONS: Be cautious when walking. Scan the area for obstacles that may increase the risk of trips and falls. When getting up in the mornings, sit up at the edge of the bed for a few minutes before getting out of bed. Consider elevating the bed at the head end to avoid drop of blood pressure when getting up. Walk always in a well-lit room (use night lights in the walls). Avoid area rugs or power cords from appliances in the middle of the walkways. Use a walker or a cane if necessary and consider physical therapy for balance exercise. Get your eyesight checked regularly.  FINANCIAL OVERSIGHT: Supervision, especially oversight when making financial decisions or transactions is also recommended.  HOME SAFETY: Consider the safety of the kitchen when operating appliances like stoves, microwave oven, and blender. Consider having supervision and share cooking responsibilities until no longer able to participate in those. Accidents with firearms and other hazards in the house should be identified and addressed as well.   ABILITY TO BE LEFT ALONE: If patient is unable to contact 911 operator, consider using LifeLine, or when the need is there, arrange for someone to stay with patients. Smoking is a fire hazard, consider supervision or cessation. Risk of wandering should be assessed by caregiver and if detected at any point, supervision and safe proof recommendations should be instituted.  MEDICATION SUPERVISION: Inability to self-administer medication needs to be constantly addressed. Implement a mechanism to ensure safe administration of the medications.   DRIVING: Regarding driving, in patients with progressive memory problems, driving will be impaired. We advise to have someone else do the driving if trouble finding directions or if minor accidents are reported.  Independent driving assessment is available to determine safety of driving.   If you are interested in the driving assessment, you can contact the following:  [employer] in [place]  Fonda Longfellow [phone] or [phone]      [place] refers to food and lifestyle choices that are based on the traditions of countries located on the [employer]. This way of eating has been shown to help prevent certain conditions and improve outcomes for people who have chronic diseases, like kidney disease and heart disease. What are tips for following this plan? Lifestyle  Cook and eat meals together with your family, when possible. Drink enough fluid to keep your urine clear or pale yellow. Be physically active every day. This includes: Aerobic exercise like running or swimming. Leisure activities like gardening, walking, or housework. Get 7-8 hours of sleep each night. If recommended by your health care provider, drink red wine in moderation. This means 1 glass a day for nonpregnant women and 2 glasses a day for men. A glass of wine equals 5 oz (150 mL). Reading food labels  Check the serving size of packaged foods. For foods such as rice and pasta, the serving size refers to the amount of cooked product, not dry. Check the total fat in packaged foods. Avoid foods that have saturated fat or trans fats. Check the ingredients list for added sugars, such as corn syrup. Shopping  At [employer],  buy most of your food from the areas near the walls of the store. This includes: Fresh fruits and vegetables (produce). Grains, beans, nuts, and seeds. Some of these may be available in unpackaged forms or large amounts (in bulk). Fresh seafood. Poultry and eggs. Low-fat dairy products. Buy whole ingredients instead of prepackaged foods. Buy fresh  fruits and vegetables in-season from local farmers markets. Buy frozen fruits and vegetables in resealable bags. If you do not have access to quality fresh seafood, buy precooked frozen shrimp or canned fish, such as tuna, salmon, or sardines. Buy small amounts of raw or cooked vegetables, salads, or olives from the deli or salad bar at your store. Stock your pantry so you always have certain foods on hand, such as olive oil, canned tuna, canned tomatoes, rice, pasta, and beans. Cooking  Cook foods with extra-virgin olive oil instead of using butter or other vegetable oils. Have meat as a side dish, and have vegetables or grains as your main dish. This means having meat in small portions or adding small amounts of meat to foods like pasta or stew. Use beans or vegetables instead of meat in common dishes like chili or lasagna. Experiment with different cooking methods. Try roasting or broiling vegetables instead of steaming or sauteing them. Add frozen vegetables to soups, stews, pasta, or rice. Add nuts or seeds for added healthy fat at each meal. You can add these to yogurt, salads, or vegetable dishes. Marinate fish or vegetables using olive oil, lemon juice, garlic, and fresh herbs. Meal planning  Plan to eat 1 vegetarian meal one day each week. Try to work up to 2 vegetarian meals, if possible. Eat seafood 2 or more times a week. Have healthy snacks readily available, such as: Vegetable sticks with hummus. Greek yogurt. Fruit and nut trail mix. Eat balanced meals throughout the week. This includes: Fruit: 2-3 servings a day Vegetables: 4-5 servings a day Low-fat dairy: 2 servings a day Fish, poultry, or lean meat: 1 serving a day Beans and legumes: 2 or more servings a week Nuts and seeds: 1-2 servings a day Whole grains: 6-8 servings a day Extra-virgin olive oil: 3-4 servings a day Limit red meat and sweets to only a few servings a month What are my food choices? Mediterranean  diet Recommended Grains: Whole-grain pasta. Brown rice. Bulgar wheat. Polenta. Couscous. Whole-wheat bread. [place]. Vegetables: Artichokes. Beets. Broccoli. Cabbage. Carrots. Eggplant. Green beans. Chard. Kale. Spinach. Onions. Leeks. Peas. Squash. Tomatoes. Peppers. Radishes. Fruits: Apples. Apricots. Avocado. Berries. Bananas. Cherries. Dates. Figs. Grapes. Lemons. Melon. Oranges. Peaches. Plums. Pomegranate. Meats and other protein foods: Beans. Almonds. Sunflower seeds. Pine nuts. Peanuts. [place]. Salmon. Scallops. Shrimp. [place]. Tilapia. Clams. Oysters. Eggs. Dairy: Low-fat milk. Cheese. Greek yogurt. Beverages: Water. Red wine. Herbal tea. Fats and oils: Extra virgin olive oil. Avocado oil. Grape seed oil. Sweets and desserts: [place] yogurt with honey. Baked apples. Poached pears. Trail mix. Seasoning and other foods: Basil. Cilantro. Coriander. Cumin. Mint. Parsley. Sage. Rosemary. Tarragon. Garlic. Oregano. Thyme. Pepper. Balsalmic vinegar. Tahini. Hummus. Tomato sauce. Olives. Mushrooms. Limit these Grains: Prepackaged pasta or rice dishes. Prepackaged cereal with added sugar. Vegetables: Deep fried potatoes (french fries). Fruits: Fruit canned in syrup. Meats and other protein foods: Beef. Pork. Lamb. Poultry with skin. Hot dogs. [place] Salines. Dairy: Ice cream. Sour cream. Whole milk. Beverages: Juice. Sugar-sweetened soft drinks. Beer. Liquor and spirits. Fats and oils: Butter. Canola oil. Vegetable oil. Beef fat (tallow). Lard. Sweets and desserts: Cookies. Cakes. Pies. Candy. Seasoning  and other foods: Mayonnaise. Premade sauces and marinades. The items listed may not be a complete list. Talk with your dietitian about what dietary choices are right for you. Summary The Mediterranean diet includes both food and lifestyle choices. Eat a variety of fresh fruits and vegetables, beans, nuts, seeds, and whole grains. Limit the amount of red meat and sweets that you eat. Talk with your  health care provider about whether it is safe for you to drink red wine in moderation. This means 1 glass a day for nonpregnant women and 2 glasses a day for men. A glass of wine equals 5 oz (150 mL). This information is not intended to replace advice given to you by your health care provider. Make sure you discuss any questions you have with your health care provider. Document Released: 02/14/2016 Document Revised: 03/18/2016 Document Reviewed: 02/14/2016 Elsevier Interactive Patient Education  2017 [employer].   We have sent a referral to [facility] for your MRI and they will call you directly to schedule your appointment. They are located at [address]. If you need to contact them directly please call [phone].  Your provider has requested that you have labwork completed today. Please go to [facility] ([id number]) on the second floor of this building before leaving the office today. You do not need to check in. If you are not called within 15 minutes please check with the front desk.

## 2022-09-23 NOTE — Progress Notes (Signed)
Assessment/Plan:   Dementia likely due to Alzheimer's disease, with vascular component  [name] is a very pleasant 87 y.o. RH male e with  a history of hypertension, hyperlipidemia, DM with peripheral neuropathy and diabetic retinopathy, bilateral mild ICA stenosis, CHF, history of sick sinus syndrome, anxiety, B12 deficiency, anemia of chronic disease, hypothyroidism, history of Gilbert's syndrome, stage III CKD, ITP, OSA with failed CPAP in September 2006 seen today in follow up for memory loss. Patient is currently on memantine 10 mg twice daily and rivastigmine 1.5 mg bid. Because of history of chronic diarrhea, family will discuss increasing rivastigmine if it were necessary. MRI brain personally reviewed was remarkable for advanced atrophy and findings of chronic small vessel disease.  Neuropsych evaluation yielded at most Dementia likely due to very mild Alzheimer's etiology. MMSE today is 23/30      Recommendations   Follow up in 6  months. Continue to control mood as per PCP Continue memantine 10 mg twice daily, side effects discussed Continue rivastigmine 1.5 mg twice daily, side effects discussed. Will not increase medicine (to 3 mg bid) unless family agrees with the plan, given his history of diarrhea Recommend increasing social activity, ie [facility]  Continue to control cardiovascular risk factors    Subjective:    This patient is accompanied in the office by his daughter who supplements the history.  Previous records as well as any outside records available were reviewed prior to todays visit. Patient was last seen on  03/25/22    Any changes in memory since last visit?  Endorsed. Sometimes he forgets the topic of conversation or the names of his grandchildren. He has not been doing brain games . He likes to read.  repeats oneself?  Endorsed, more frequently than before.  Disoriented when walking into a room?  Sometimes he thinks he went to his  childhood home thinking is his house, sometimes he does not remember that he is in [place] objects in unusual places?  He constantly loses the wallet different places   Wandering behavior? He has a CNA with him on a frequent basis, no wandering behavior is noted. Any personality changes since last visit?  He gets frust sometimes, especially with his wife  Any worsening depression?:  denies   Hallucinations or paranoia?  Denies. He had Covid in December at which time he did have some hallucinations, such as being locked in a car dealership, no recurrence.  Seizures?    denies    Any sleep changes?  "Good. Gets up early"  Denies vivid dreams, REM behavior or sleepwalking   Sleep apnea?   denies   Any hygiene concerns?  He does not want to shower as before. He is more disheveled Independent of bathing and dressing?  CNA is at reach, but he remains mostly independent Does the patient needs help with medications?  A CNA is in charge, because his wife was taking his meds . CNA provides care to him 8 hours a day  Who is in charge of the finances?  Daughter is in charge     Any changes in appetite?    CNA  cooks  Patient have trouble swallowing?  denies   Does the patient cook?  No, daughter provides the meals.  Patient denies   Any headaches?   denies   Chronic back pain  denies   Ambulates with difficulty?  Uses a walker to ambulate for stability, cannot do so without it  Recent falls or head injuries? denies     Unilateral weakness, numbness or tingling? denies   Any tremors?  denies   Any anosmia?  Patient denies   Any incontinence of urine?  Endorsed, he wears Depends Any bowel dysfunction? Might have stool incontinence, unclear  Patient lives  at [facility] He no longer drives   Neuropsych evaluation 06/10/22  Briefly, results suggested a profound impairment surrounding expressive language (i.e., sentence repetition, confrontation naming, phonemic and semantic  fluency). Additional impairments were exhibited across processing speed, executive functioning, and both encoding (i.e., learning) and delayed retrieval aspects of memory. A further weakness was noted across attention/concentration while performance variability was exhibited across recognition aspects of verbal memory. Concerns surrounding underlying Alzheimer's disease are reasonable. However, he did not exhibit a fully classic Alzheimer's disease pattern across testing. He did not benefit from repeated exposure to information across learning trials and was fully amnestic (i.e., 0% retention) across list and figure-based memory tasks. However, he retained 100% of information across a story-based task (albeit, his raw score was 2) and, more importantly, performed well across list and story recognition tasks. While this raises concerns for rapid forgetting, there is not current evidence to suggest a consistently profound storage impairment. This could suggest that this illness remains in earlier stages than perhaps what might have been anticipated by his medical team. The vascular contribution is less clear as there was no mention of microvascular ischemia severity by the neuroradiologist who read [name]'s recent MRI. While there is no stroke history, he does appear to exhibit relatively advanced ischemia by my personal review. While this could be a contributing factor, it would appear secondary to the presence of a neurodegenerative illness.      Initial visit for memory 12/13/21  How long did patient have memory difficulties?   His daughter and his wife noted memory changes dating back to 5-6 years ago, attributed to "age", but they report worsening over the last 6 months " he does not remember that he has grandchildren"-daughter says. STM worse than his LTM though.  Patient lives with: [facility].    repeats oneself? Endorsed, especially with appointments Disoriented when walking into a room?  Daughter reports that at times he says "I want to go home, referring to his childhood home" Leaving objects in unusual places?  Patient denies but daughter states that " things get lost all the time , such as the wallet on the bed sheets"  Ambulates  with difficulty?   Patient has a history of diabetic neuropathy, and has been evaluated in the past confirming the diagnosis.  He is using a cane to ambulate, he admits to not having been walking frequently or being as active. PT was recommended but patient declines. He does not participate often in any activities at [place] because "it is too far and I need the car to get there" Recent falls?  " fell out of bed a few weeks ago when I was turning, hurt my pride " Any head injuries?  Patient denies   History of seizures?   Patient denies   Wandering behavior?  Patient denies  Patient drives?   He still drives but in one incident, didn't know how to get home. "He has decreased reaction time"-wife says. "He is afraid to lose his license"   Any mood changes such irritability agitation?  Very angry because he lost his car key and cannot drive.  Any history of depression?: Had temporarily depression in the  distant past, but he denies any current symptoms  Hallucinations?  Patient denies   Paranoia?  Patient denies   Patient reports that he sleeps well without vivid dreams, REM behavior or sleepwalking but his wife reports that at 2 am, he gets up and starts to go though different drawers   History of sleep apnea?  Patient denies   Any hygiene concerns?  Patient denies   Independent of bathing and dressing?  Endorsed  Does the patient needs help with medications?  Wife is in charge Who is in charge of the finances?  Daughter  Any changes in appetite?  Patient denies , lost some weight,because "the cafeteria is too far and I stay at home, eat what I have there"   Patient have trouble swallowing? Patient denies   Does the patient cook?  Patient denies   Any  kitchen accidents such as leaving the stove on? Patient denies   Any headaches?  Patient denies   The double vision? Patient denies   Any focal numbness or tingling?  Patient denies   Chronic back pain Patient has chronic pain and has refused PT in the past  Unilateral weakness?  Patient denies   Any tremors?  Patient denies   Any history of anosmia?  Patient denies   Any incontinence of urine?  Endorsed, uses diapers  Any bowel dysfunction?  Chronic diarrhea History of heavy alcohol intake?  Patient denies   History of heavy tobacco use?  Patient denies   Family history of dementia?  Patient  Mother had AD Dementia   Brother LBD     Labs : 12/06/2021, lipid panel normal, A1c 7.9, total bilirubin 1.4, CBC with MCV 94.2, H&H 12.4-35.9 respectively, platelets 228        PREVIOUS MEDICATIONS: Donepezil  CURRENT MEDICATIONS:  Outpatient Encounter Medications as of 09/23/2022  Medication Sig   amLODipine (NORVASC) 10 MG tablet TAKE 1 TABLET ONCE DAILY.   citalopram (CELEXA) 40 MG tablet Take 40 mg by mouth daily.    ferrous sulfate 325 (65 FE) MG EC tablet Take 325 mg by mouth 3 (three) times a week.   hydrochlorothiazide (HYDRODIURIL) 25 MG tablet TAKE 1 TABLET ONCE DAILY.   JANUVIA 100 MG tablet Take 100 mg by mouth daily.   LANTUS SOLOSTAR 100 UNIT/ML injection Inject 64 Units into the skin every morning.   memantine (NAMENDA) 10 MG tablet TAKE ONE TABLET BY MOUTH EVERY MORNING and TAKE ONE TABLET BY MOUTH EVERYDAY AT BEDTIME   metFORMIN (GLUCOPHAGE-XR) 500 MG 24 hr tablet Take 500 mg by mouth daily.   rivastigmine (EXELON) 1.5 MG capsule TAKE ONE CAPSULE BY MOUTH TWICE DAILY   spironolactone (ALDACTONE) 25 MG tablet Take 25 mg by mouth daily.   vitamin B-12 (CYANOCOBALAMIN) 500 MCG tablet Take 1 tablet (500 mcg total) by mouth every other day.   zolpidem (AMBIEN) 10 MG tablet Take 5 mg by mouth at bedtime. For sleep   aspirin 81 MG tablet Take 1 tablet (81 mg total) by mouth daily.  (Patient not taking: Reported on 08/12/2022)   atorvastatin (LIPITOR) 10 MG tablet Take 10 mg by mouth daily. (Patient not taking: Reported on 08/12/2022)   BENICAR 20 MG tablet Take 20 mg by mouth daily.  (Patient not taking: Reported on 08/12/2022)   calcium-vitamin D (OSCAL WITH D) 500-200 MG-UNIT per tablet Take 1 tablet by mouth daily. Vitamin D 3  daily (Patient not taking: Reported on 08/12/2022)   famotidine (PEPCID) 20 MG tablet Take  20 mg by mouth 2 (two) times daily.   No facility-administered encounter medications on file as of 09/23/2022.       09/23/2022    4:00 PM  MMSE - Mini Mental State Exam  Orientation to time 4  Orientation to Place 4  Registration 3  Attention/ Calculation 5  Recall 0  Language- name 2 objects 2  Language- repeat 1  Language- follow 3 step command 3  Language- read & follow direction 1  Write a sentence 0  Copy design 0  Total score 23      12/15/2021   10:00 AM  Montreal Cognitive Assessment   Visuospatial/ Executive (0/5) 1  Naming (0/3) 2  Attention: Read list of digits (0/2) 1  Attention: Read list of letters (0/1) 1  Attention: Serial 7 subtraction starting at 100 (0/3) 3  Language: Repeat phrase (0/2) 2  Language : Fluency (0/1) 0  Abstraction (0/2) 1  Delayed Recall (0/5) 1  Orientation (0/6) 3  Total 15  Adjusted Score (based on education) 15    Objective:     PHYSICAL EXAMINATION:    VITALS:   Vitals:   09/23/22 1303  BP: (!) 163/71  Pulse: 64  Resp: 20  SpO2: 99%  Weight: 178 lb (80.7 kg)  Height: 6\' 2"  (1.88 m)    GEN:  The patient appears stated age and is in NAD. HEENT:  Normocephalic, atraumatic.   Neurological examination:  General: NAD, well-groomed, appears stated age. Orientation: The patient is alert. Oriented to person, place and year, not to date Cranial nerves: There is good facial symmetry.The speech is fluent and clear. No aphasia or dysarthria. Fund of knowledge is appropriate. Recent and remote  memory are impaired. Attention and concentration are reduced.  Able to name objects and repeat phrases.  Hearing is intact to conversational tone.  Sensation: Sensation is intact to light touch throughout Motor: Strength is at least antigravity x4. DTR's 2/4 in UE/LE     Movement examination: Tone: There is normal tone in the UE/LE Abnormal movements: Very mild intention tremors, not at rest.  No myoclonus.  No asterixis.   Coordination:  There is no decremation with RAM's. Normal finger to nose  Gait and Station: The patient has some difficulty arising out of a deep-seated chair without the use of the hands. The patient's stride length is short and wider based. Gait is cautious. Needs a walker   Thank you for allowing [place] the opportunity to participate in the care of this nice patient. Please do not hesitate to contact us for any questions or concerns.   Total time spent on today's visit was 31 minutes dedicated to this patient today, preparing to see patient, examining the patient, ordering tests and/or medications and counseling the patient, documenting clinical information in the EHR or other health record, independently interpreting results and communicating results to the patient/family, discussing treatment and goals, answering patient's questions and coordinating care.  Cc:  Virgie Dad, MD  Sharene Butters 09/23/2022 4:37 PM

## 2022-10-07 ENCOUNTER — Non-Acute Institutional Stay: Payer: PPO | Admitting: Internal Medicine

## 2022-10-07 ENCOUNTER — Encounter: Payer: Self-pay | Admitting: Internal Medicine

## 2022-10-07 VITALS — BP 152/68 | HR 64 | Temp 97.8°F | Resp 17 | Wt 179.0 lb

## 2022-10-07 DIAGNOSIS — E113293 Type 2 diabetes mellitus with mild nonproliferative diabetic retinopathy without macular edema, bilateral: Secondary | ICD-10-CM

## 2022-10-07 DIAGNOSIS — F02A0 Dementia in other diseases classified elsewhere, mild, without behavioral disturbance, psychotic disturbance, mood disturbance, and anxiety: Secondary | ICD-10-CM

## 2022-10-07 DIAGNOSIS — I10 Essential (primary) hypertension: Secondary | ICD-10-CM | POA: Diagnosis not present

## 2022-10-07 DIAGNOSIS — E785 Hyperlipidemia, unspecified: Secondary | ICD-10-CM

## 2022-10-07 DIAGNOSIS — R32 Unspecified urinary incontinence: Secondary | ICD-10-CM

## 2022-10-07 DIAGNOSIS — Z794 Long term (current) use of insulin: Secondary | ICD-10-CM

## 2022-10-07 DIAGNOSIS — G301 Alzheimer's disease with late onset: Secondary | ICD-10-CM

## 2022-10-07 DIAGNOSIS — I2510 Atherosclerotic heart disease of native coronary artery without angina pectoris: Secondary | ICD-10-CM

## 2022-10-07 DIAGNOSIS — E113299 Type 2 diabetes mellitus with mild nonproliferative diabetic retinopathy without macular edema, unspecified eye: Secondary | ICD-10-CM

## 2022-10-07 DIAGNOSIS — R2681 Unsteadiness on feet: Secondary | ICD-10-CM

## 2022-10-07 NOTE — Progress Notes (Signed)
Location:  [place] of Service:  Clinic (12)  Provider:   Code Status: *** Goals of Care:     10/07/2022   10:32 AM  Advanced Directives  Does Patient Have a Medical Advance Directive? Yes  Type of Paramedic of [place];Living will  Copy of [place] in Chart? No - copy requested     Chief Complaint  Patient presents with  . Medical Management of Chronic Issues    Patient is here for a 8 week follow up  . Immunizations    Pneumonia, shingle , tdap and covid  . Quality Metric Gaps    Discussed need for a foot exam and diabetic kidney evaulation    HPI: Patient is a 87 y.o. Mullen seen today for medical management of chronic diseases.   Lives in [place] with his wife Daughter in town who is Primary Caregiver Has CMA come 7 hour a day to help with Medications Meals and Showers   Patient has diagnosis of  Diabetes Type 2  On Insulin Injects himself Does not check his CBGS Did not bring any readings for me His A1C looks good Retinopathy due to Diabetes Follows with Dr Manuella Ghazi and Lamarr Lulas Alzhemers Dementia MRI shows Advanced Atrophy Last MMSE 23/30 per Neurology  On Namenda and Exelon. Needs help now with Med management, and ADLS Both Urinary and Bowel Incontinence Repeats Himself  CAD S/P Stent placement No Active symptoms HLD,Gait Instability Using Walker now Peripheral Neuropathy, Anemia, OSA falied CPAP  Per her caregiver today who is Family friend he had few falls but she did  not know how Mostly when he forgets to use his walker. No Injuries  No other issues      Past Medical History:  Diagnosis Date  . CAD (coronary artery disease)    Bare meta stent RCA  . Carotid stenosis    a. Carotid US 3/17: bilateral ICA 1-39% >> repeat 2 years  . Coronary atherosclerosis of native coronary artery 08/24/2008   S/P PCI  . Dementia 12/15/2021   concerns for Alzheimer's disease  . Essential  hypertension, benign 08/24/2008  . Fatigue 08/08/2010  . History of nuclear stress test    Myoview 12/16: EF 66%, very mild inferior ischemia; Low Risk  . Mixed hyperlipidemia 08/24/2008  . Myocardial infarction 10/06/2007  . Nonproliferative diabetic retinopathy 08/08/2011  . Nuclear sclerosis of both eyes 12/01/2017  . Peripheral neuropathy   . Pulmonary nodule, right 09/13/2007   Right upper lobe        . Sinoatrial node dysfunction 08/24/2008   Chronotropic incompetence  . Snoring 09/27/2009  . Type II diabetes mellitus 08/08/2011    Past Surgical History:  Procedure Laterality Date  . LEFT HEART CATHETERIZATION WITH CORONARY ANGIOGRAM N/A 03/04/2012   Procedure: LEFT HEART CATHETERIZATION WITH CORONARY ANGIOGRAM;  Surgeon: Burnell Blanks, MD;  Location: [facility] CATH LAB;  Service: Cardiovascular;  Laterality: N/A;  . TONSILLECTOMY      Allergies  Allergen Reactions  . Ace Inhibitors   . Alpha Lipoic Acid [Lipoic Acid]     Upset Stomach  . Invokana [Canagliflozin]   . Heparin Hives  . Isosorbide Mononitrate [Isosorbide Dinitrate Er] Hives  . Penicillins Hives  . Sulfonamide Derivatives Hives    Outpatient Encounter Medications as of 10/07/2022  Medication Sig  . amLODipine (NORVASC) 10 MG tablet TAKE 1 TABLET ONCE DAILY.  Marland Kitchen aspirin 81 MG tablet Take 1 tablet (81 mg total)  by mouth daily.  . citalopram (CELEXA) 40 MG tablet Take 40 mg by mouth daily.   . famotidine (PEPCID) 20 MG tablet Take 20 mg by mouth 2 (two) times daily.  . ferrous sulfate 325 (65 FE) MG EC tablet Take 325 mg by mouth 3 (three) times a week.  . hydrochlorothiazide (HYDRODIURIL) 25 MG tablet TAKE 1 TABLET ONCE DAILY.  Marland Kitchen JANUVIA 100 MG tablet Take 100 mg by mouth daily.  Marland Kitchen LANTUS SOLOSTAR 100 UNIT/ML injection Inject Donald Units into the skin every morning.  . memantine (NAMENDA) 10 MG tablet TAKE ONE TABLET BY MOUTH EVERY MORNING and TAKE ONE TABLET BY MOUTH EVERYDAY AT BEDTIME  . metFORMIN  (GLUCOPHAGE-XR) 500 MG 24 hr tablet Take 500 mg by mouth daily.  . rivastigmine (EXELON) 1.5 MG capsule TAKE ONE CAPSULE BY MOUTH TWICE DAILY  . spironolactone (ALDACTONE) 25 MG tablet Take 25 mg by mouth daily.  . vitamin B-12 (CYANOCOBALAMIN) 500 MCG tablet Take 1 tablet (500 mcg total) by mouth every other day.  . zolpidem (AMBIEN) 10 MG tablet Take 5 mg by mouth at bedtime. For sleep  . atorvastatin (LIPITOR) 10 MG tablet Take 10 mg by mouth daily. (Patient not taking: Reported on 08/12/2022)  . BENICAR 20 MG tablet Take 20 mg by mouth daily.  (Patient not taking: Reported on 10/07/2022)  . calcium-vitamin D (OSCAL WITH D) 500-200 MG-UNIT per tablet Take 1 tablet by mouth daily. Vitamin D 3  daily (Patient not taking: Reported on 08/12/2022)   No facility-administered encounter medications on file as of 10/07/2022.    Review of Systems:  Review of Systems  Unable to perform ROS: Dementia    Health Maintenance  Topic Date Due  . FOOT EXAM  Never done  . Diabetic kidney evaluation - Urine ACR  Never done  . DTaP/Tdap/Td (3 - Td or Tdap) 03/18/2021  . COVID-19 Vaccine (5 - 2023-24 season) 07/02/2022  . Zoster Vaccines- Shingrix (1 of 2) 01/06/2023 (Originally 08/22/1954)  . Medicare Annual Wellness (AWV)  12/07/2022  . INFLUENZA VACCINE  02/05/2023  . HEMOGLOBIN A1C  02/17/2023  . OPHTHALMOLOGY EXAM  07/30/2023  . Diabetic kidney evaluation - eGFR measurement  08/20/2023  . Pneumonia Vaccine 55+ Years old  Completed  . HPV VACCINES  Aged Out    Physical Exam: There were no vitals filed for this visit. There is no height or weight on file to calculate BMI. Physical Exam Vitals reviewed.  Constitutional:      Appearance: Normal appearance.  HENT:     Head: Normocephalic.     Nose: Nose normal.     Mouth/Throat:     Mouth: Mucous membranes are moist.     Pharynx: Oropharynx is clear.  Eyes:     Pupils: Pupils are equal, round, and reactive to light.  Cardiovascular:     Rate and  Rhythm: Normal rate and regular rhythm.     Pulses: Normal pulses.     Heart sounds: No murmur heard. Pulmonary:     Effort: Pulmonary effort is normal. No respiratory distress.     Breath sounds: Normal breath sounds. No rales.  Abdominal:     General: Abdomen is flat. Bowel sounds are normal.     Palpations: Abdomen is soft.  Musculoskeletal:        General: No swelling.     Cervical back: Neck supple.  Skin:    General: Skin is warm.  Neurological:     General: No focal deficit  present.     Mental Status: He is alert.  Psychiatric:        Mood and Affect: Mood normal.        Thought Content: Thought content normal.    Labs reviewed: Basic Metabolic Panel: Recent Labs    12/13/21 1447 08/19/22 0000  NA  --  138  K  --  4.5  CL  --  101  BUN  --  27*  CREATININE  --  1.3  CALCIUM  --  9.1  TSH 1.18 2.58   Liver Function Tests: Recent Labs    08/19/22 0000  AST 27  ALT 22  ALBUMIN 4.0   No results for input(s): "LIPASE", "AMYLASE" in the last 8760 hours. No results for input(s): "AMMONIA" in the last 8760 hours. CBC: Recent Labs    08/19/22 0000  WBC 6.0  HGB 12.5*  HCT 37*  PLT 261   Lipid Panel: Recent Labs    08/19/22 0000  CHOL 136  HDL 37  LDLCALC 80  TRIG 96   Lab Results  Component Value Date   HGBA1C 7.7 08/19/2022    Procedures since last visit: No results found.  Assessment/Plan 1. 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, with long-term current use of insulin,  A1C Good Range   2. Mild late onset Alzheimer's dementia without behavioral disturbance, psychotic disturbance, mood disturbance, or anxiety ***  3. Essential hypertension, benign ***  4. Coronary artery disease involving native coronary artery of native heart without angina pectoris ***  5. Hyperlipidemia, unspecified hyperlipidemia type ***  6. Nonproliferative diabetic retinopathy ***  7. Urinary incontinence,  unspecified type ***  8. Gait instability ***    Labs/tests ordered:  * No order type specified * Next appt:  Visit date not found

## 2022-10-08 ENCOUNTER — Encounter: Payer: Self-pay | Admitting: Internal Medicine

## 2022-10-08 DIAGNOSIS — I129 Hypertensive chronic kidney disease with stage 1 through stage 4 chronic kidney disease, or unspecified chronic kidney disease: Secondary | ICD-10-CM | POA: Insufficient documentation

## 2022-10-08 DIAGNOSIS — R269 Unspecified abnormalities of gait and mobility: Secondary | ICD-10-CM | POA: Insufficient documentation

## 2022-10-08 DIAGNOSIS — F325 Major depressive disorder, single episode, in full remission: Secondary | ICD-10-CM | POA: Insufficient documentation

## 2022-10-08 DIAGNOSIS — R413 Other amnesia: Secondary | ICD-10-CM | POA: Insufficient documentation

## 2022-10-08 DIAGNOSIS — N183 Chronic kidney disease, stage 3 unspecified: Secondary | ICD-10-CM | POA: Insufficient documentation

## 2022-10-08 DIAGNOSIS — E1140 Type 2 diabetes mellitus with diabetic neuropathy, unspecified: Secondary | ICD-10-CM | POA: Insufficient documentation

## 2022-10-08 DIAGNOSIS — J309 Allergic rhinitis, unspecified: Secondary | ICD-10-CM | POA: Insufficient documentation

## 2022-10-08 DIAGNOSIS — E1121 Type 2 diabetes mellitus with diabetic nephropathy: Secondary | ICD-10-CM | POA: Insufficient documentation

## 2022-10-08 DIAGNOSIS — G4733 Obstructive sleep apnea (adult) (pediatric): Secondary | ICD-10-CM | POA: Insufficient documentation

## 2022-10-08 DIAGNOSIS — N1831 Chronic kidney disease, stage 3a: Secondary | ICD-10-CM | POA: Insufficient documentation

## 2022-10-21 ENCOUNTER — Other Ambulatory Visit: Payer: Self-pay | Admitting: Physician Assistant

## 2022-11-18 ENCOUNTER — Encounter: Payer: Self-pay | Admitting: Internal Medicine

## 2022-11-18 ENCOUNTER — Other Ambulatory Visit: Payer: Self-pay | Admitting: Orthopedic Surgery

## 2022-11-18 DIAGNOSIS — F02818 Dementia in other diseases classified elsewhere, unspecified severity, with other behavioral disturbance: Secondary | ICD-10-CM

## 2022-11-18 DIAGNOSIS — G301 Alzheimer's disease with late onset: Secondary | ICD-10-CM

## 2022-11-18 MED ORDER — HYDROXYZINE PAMOATE 25 MG PO CAPS: 25.0000 mg | ORAL_CAPSULE | Freq: Two times a day (BID) | ORAL | 0 refills | Status: AC | PRN

## 2023-01-14 ENCOUNTER — Emergency Department (HOSPITAL_COMMUNITY): Payer: PPO

## 2023-01-14 ENCOUNTER — Other Ambulatory Visit: Payer: Self-pay

## 2023-01-14 ENCOUNTER — Emergency Department (HOSPITAL_COMMUNITY)
Admission: EM | Admit: 2023-01-14 | Discharge: 2023-01-14 | Disposition: A | Discharge status: Home / Self Care | Payer: PPO | Attending: Emergency Medicine | Admitting: Emergency Medicine

## 2023-01-14 DIAGNOSIS — Z7984 Long term (current) use of oral hypoglycemic drugs: Secondary | ICD-10-CM | POA: Diagnosis not present

## 2023-01-14 DIAGNOSIS — R7989 Other specified abnormal findings of blood chemistry: Secondary | ICD-10-CM | POA: Diagnosis not present

## 2023-01-14 DIAGNOSIS — Z79899 Other long term (current) drug therapy: Secondary | ICD-10-CM | POA: Diagnosis not present

## 2023-01-14 DIAGNOSIS — W010XXA Fall on same level from slipping, tripping and stumbling without subsequent striking against object, initial encounter: Secondary | ICD-10-CM | POA: Diagnosis not present

## 2023-01-14 DIAGNOSIS — M546 Pain in thoracic spine: Secondary | ICD-10-CM | POA: Diagnosis not present

## 2023-01-14 DIAGNOSIS — F0390 Unspecified dementia without behavioral disturbance: Secondary | ICD-10-CM | POA: Diagnosis not present

## 2023-01-14 DIAGNOSIS — I2510 Atherosclerotic heart disease of native coronary artery without angina pectoris: Secondary | ICD-10-CM | POA: Insufficient documentation

## 2023-01-14 DIAGNOSIS — M542 Cervicalgia: Secondary | ICD-10-CM | POA: Insufficient documentation

## 2023-01-14 DIAGNOSIS — E1165 Type 2 diabetes mellitus with hyperglycemia: Secondary | ICD-10-CM | POA: Diagnosis not present

## 2023-01-14 DIAGNOSIS — Z7982 Long term (current) use of aspirin: Secondary | ICD-10-CM | POA: Insufficient documentation

## 2023-01-14 DIAGNOSIS — W19XXXA Unspecified fall, initial encounter: Secondary | ICD-10-CM

## 2023-01-14 DIAGNOSIS — I10 Essential (primary) hypertension: Secondary | ICD-10-CM | POA: Insufficient documentation

## 2023-01-14 LAB — CBG MONITORING, ED: Glucose-Capillary: 102 mg/dL — ABNORMAL HIGH (ref 70–99)

## 2023-01-14 LAB — CBC WITH DIFFERENTIAL/PLATELET
Abs Immature Granulocytes: 0.04 10*3/uL (ref 0.00–0.07)
Basophils Absolute: 0.1 10*3/uL (ref 0.0–0.1)
Basophils Relative: 1 %
Eosinophils Absolute: 0.1 10*3/uL (ref 0.0–0.5)
Eosinophils Relative: 2 %
HCT: 35.9 % — ABNORMAL LOW (ref 39.0–52.0)
Hemoglobin: 12.1 g/dL — ABNORMAL LOW (ref 13.0–17.0)
Immature Granulocytes: 1 %
Lymphocytes Relative: 9 %
Lymphs Abs: 0.8 10*3/uL (ref 0.7–4.0)
MCH: 31.9 pg (ref 26.0–34.0)
MCHC: 33.7 g/dL (ref 30.0–36.0)
MCV: 94.7 fL (ref 80.0–100.0)
Monocytes Absolute: 0.8 10*3/uL (ref 0.1–1.0)
Monocytes Relative: 9 %
Neutro Abs: 6.7 10*3/uL (ref 1.7–7.7)
Neutrophils Relative %: 78 %
Platelets: 217 10*3/uL (ref 150–400)
RBC: 3.79 MIL/uL — ABNORMAL LOW (ref 4.22–5.81)
RDW: 12.1 % (ref 11.5–15.5)
WBC: 8.5 10*3/uL (ref 4.0–10.5)
nRBC: 0 % (ref 0.0–0.2)

## 2023-01-14 LAB — URINALYSIS, ROUTINE W REFLEX MICROSCOPIC
Bacteria, UA: NONE SEEN
Bilirubin Urine: NEGATIVE
Glucose, UA: >=500 mg/dL — AB
Hgb urine dipstick: NEGATIVE
Ketones, ur: NEGATIVE mg/dL
Leukocytes,Ua: NEGATIVE
Nitrite: NEGATIVE
Protein, ur: 100 mg/dL — AB
Specific Gravity, Urine: 1.012 (ref 1.005–1.030)
pH: 5 (ref 5.0–8.0)

## 2023-01-14 LAB — BASIC METABOLIC PANEL
Anion gap: 10 (ref 5–15)
BUN: 30 mg/dL — ABNORMAL HIGH (ref 8–23)
CO2: 23 mmol/L (ref 22–32)
Calcium: 9.1 mg/dL (ref 8.9–10.3)
Chloride: 107 mmol/L (ref 98–111)
Creatinine, Ser: 1.61 mg/dL — ABNORMAL HIGH (ref 0.61–1.24)
GFR, Estimated: 42 mL/min — ABNORMAL LOW (ref >60)
Glucose, Bld: 93 mg/dL (ref 70–99)
Potassium: 4.3 mmol/L (ref 3.5–5.1)
Sodium: 140 mmol/L (ref 135–145)

## 2023-01-14 LAB — TROPONIN I (HIGH SENSITIVITY)
Troponin I (High Sensitivity): 26 ng/L — ABNORMAL HIGH (ref <18)
Troponin I (High Sensitivity): 25 ng/L — ABNORMAL HIGH (ref <18)

## 2023-01-14 NOTE — ED Triage Notes (Addendum)
Pt arrives to ED c/o cervical spine/neck pain post mechanical fall. Pt reports head strike, no LOC, no thinners. Pt from [facility] facility.

## 2023-01-14 NOTE — Discharge Instructions (Signed)
Your testing is negative for serious traumatic injury.  Keep yourself hydrated and follow-up with your doctor.  You should follow-up for recheck of your kidney function later this week.  Return to the ED with difficulty breathing, chest pain, unilateral weakness, numbness or tingling or other concerns.

## 2023-01-14 NOTE — ED Provider Notes (Signed)
[place] EMERGENCY DEPARTMENT AT [facility] Provider Note   CSN: [id number] Arrival date & time: 01/14/23  0327     History  Chief Complaint  Patient presents with   Fall   Neck Pain    [name] is a 87 y.o. male.  Patient with history of CAD, carotid stenosis, hypertension, dementia, diabetes presenting with fall.  States he was going to the bathroom and lost his balance on a "slick floor" and landed on his head and upper back.  No loss of consciousness.  Complains of pain to his posterior neck and upper back.  No blood thinner use.  No focal weakness, numbness or tingling.  No chest pain or shortness of breath.  Denies pain to his pelvis or hips.  Complains of pain to his posterior neck and upper back.  No difficulty breathing or difficulty swallowing.  No cough or fever.  No chest pain or shortness of breath.  No preceding dizziness or lightheadedness.  The history is provided by the patient and the EMS personnel.  Fall Pertinent negatives include no abdominal pain, no headaches and no shortness of breath.  Neck Pain Associated symptoms: no fever, no headaches and no weakness        Home Medications Prior to Admission medications   Medication Sig Start Date End Date Taking? Authorizing Provider  amLODipine (NORVASC) 10 MG tablet TAKE 1 TABLET ONCE DAILY. 08/03/17   Kathleene Hazel, MD  aspirin 81 MG tablet Take 1 tablet (81 mg total) by mouth daily. 09/15/12   Kathleene Hazel, MD  atorvastatin (LIPITOR) 10 MG tablet Take 10 mg by mouth daily. 08/04/22   [provider]  citalopram (CELEXA) 20 MG tablet Take 20 mg by mouth daily. 02/03/12   [provider]  famotidine (PEPCID) 20 MG tablet Take 20 mg by mouth 2 (two) times daily. 02/11/20   [provider]  ferrous sulfate 325 (65 FE) MG EC tablet Take 325 mg by mouth 3 (three) times a week.    [provider]  hydrochlorothiazide (HYDRODIURIL) 25 MG tablet  TAKE 1 TABLET ONCE DAILY. 07/21/17   Kathleene Hazel, MD  hydrOXYzine (VISTARIL) 25 MG capsule Take 1 capsule (25 mg total) by mouth 2 (two) times daily as needed (increased agitation/ sundowning). 11/18/22   Fargo, Amy E, NP  JANUVIA 100 MG tablet Take 100 mg by mouth daily. 02/11/20   [provider]  LANTUS SOLOSTAR 100 UNIT/ML injection Inject 64 Units into the skin every morning. 12/10/11   [provider]  latanoprost (XALATAN) 0.005 % ophthalmic solution Place 1 drop into both eyes at bedtime.    [provider]  memantine (NAMENDA) 10 MG tablet TAKE ONE TABLET BY MOUTH EVERY MORNING and TAKE ONE TABLET BY MOUTH EVERYDAY AT BEDTIME 10/21/22   Gwynneth Munson, Sung Amabile, PA-C  metformin (FORTAMET) 500 MG (OSM) 24 hr tablet Take 500 mg by mouth 2 (two) times daily with a meal.    [provider]  rivastigmine (EXELON) 1.5 MG capsule TAKE ONE CAPSULE BY MOUTH TWICE DAILY 10/21/22   Marcos Eke, PA-C  spironolactone (ALDACTONE) 25 MG tablet Take 25 mg by mouth daily. 02/11/20   [provider]  vitamin B-12 (CYANOCOBALAMIN) 500 MCG tablet Take 1 tablet (500 mcg total) by mouth every other day. Patient taking differently: Take 1,000 mcg by mouth every other day. 01/02/22   Marcos Eke, PA-C  zolpidem (AMBIEN) 5 MG tablet Take 5 mg  by mouth at bedtime. For sleep 01/26/12   [provider]      Allergies    Ace inhibitors, Alpha lipoic acid [lipoic acid], Invokana [canagliflozin], Heparin, Isosorbide mononitrate [isosorbide dinitrate er], Penicillins, and Sulfonamide derivatives    Review of Systems   Review of Systems  Constitutional:  Negative for activity change, appetite change, chills and fever.  HENT:  Negative for congestion.   Respiratory:  Negative for cough, chest tightness and shortness of breath.   Gastrointestinal:  Negative for abdominal pain, nausea and vomiting.  Genitourinary:  Negative for dysuria and hematuria.   Musculoskeletal:  Positive for neck pain. Negative for arthralgias and myalgias.  Skin:  Negative for wound.  Neurological:  Negative for dizziness, weakness and headaches.   all other systems are negative except as noted in the HPI and PMH.    Physical Exam Updated Vital Signs BP (!) 167/63 (BP Location: Right Arm)   Pulse (!) 54   Temp 97.9 F (36.6 C) (Oral)   Resp 16   Ht 6\' 2"  (1.88 m)   Wt 81.6 kg   SpO2 96%   BMI 23.11 kg/m  Physical Exam Vitals and nursing note reviewed.  Constitutional:      General: He is not in acute distress.    Appearance: Normal appearance. He is well-developed and normal weight. He is not ill-appearing.  HENT:     Head: Normocephalic and atraumatic.     Mouth/Throat:     Pharynx: No oropharyngeal exudate.  Eyes:     Conjunctiva/sclera: Conjunctivae normal.     Pupils: Pupils are equal, round, and reactive to light.  Neck:     Comments: C-collar in place, no step-offs. Cardiovascular:     Rate and Rhythm: Normal rate and regular rhythm.     Heart sounds: Normal heart sounds. No murmur heard. Pulmonary:     Effort: Pulmonary effort is normal. No respiratory distress.     Breath sounds: Normal breath sounds.  Chest:     Chest wall: No tenderness.  Abdominal:     Palpations: Abdomen is soft.     Tenderness: There is no abdominal tenderness. There is no guarding or rebound.  Musculoskeletal:        General: Tenderness present. Normal range of motion.     Cervical back: Normal range of motion and neck supple.     Comments: Tenderness to upper thoracic spine.  No step-offs.  No lumbar spine tenderness.  Pelvis stable, full range of motion hips without pain bilaterally  Skin:    General: Skin is warm.  Neurological:     General: No focal deficit present.     Mental Status: He is alert and oriented to person, place, and time. Mental status is at baseline.     Cranial Nerves: No cranial nerve deficit.     Motor: No abnormal muscle tone.      Coordination: Coordination normal.     Comments: No ataxia on finger to nose bilaterally. No pronator drift. 5/5 strength throughout. CN 2-12 intact.Equal grip strength. Sensation intact.   Psychiatric:        Behavior: Behavior normal.     ED Results / Procedures / Treatments   Labs (all labs ordered are listed, but only abnormal results are displayed) Labs Reviewed  URINALYSIS, ROUTINE W REFLEX MICROSCOPIC - Abnormal; Notable for the following components:      Result Value   Glucose, UA >=500 (*)    Protein, ur 100 (*)  All other components within normal limits  CBC WITH DIFFERENTIAL/PLATELET - Abnormal; Notable for the following components:   RBC 3.79 (*)    Hemoglobin 12.1 (*)    HCT 35.9 (*)    All other components within normal limits  BASIC METABOLIC PANEL - Abnormal; Notable for the following components:   BUN 30 (*)    Creatinine, Ser 1.61 (*)    GFR, Estimated 42 (*)    All other components within normal limits  CBG MONITORING, ED - Abnormal; Notable for the following components:   Glucose-Capillary 102 (*)    All other components within normal limits  TROPONIN I (HIGH SENSITIVITY) - Abnormal; Notable for the following components:   Troponin I (High Sensitivity) 26 (*)    All other components within normal limits  TROPONIN I (HIGH SENSITIVITY) - Abnormal; Notable for the following components:   Troponin I (High Sensitivity) 25 (*)    All other components within normal limits    EKG EKG Interpretation Date/Time:  Wednesday January 14 2023 03:39:16 EDT Ventricular Rate:  50 PR Interval:  198 QRS Duration:  126 QT Interval:  449 QTC Calculation: 410 R Axis:   -66  Text Interpretation: Sinus or ectopic atrial rhythm Supraventricular bigeminy Nonspecific IVCD with LAD Left ventricular hypertrophy Anterior infarct, old Nonspecific T abnormalities, lateral leads No significant change was found Confirmed by Glynn Octave [phone]) on 01/14/2023 3:46:54  AM  Radiology CT Thoracic Spine Wo Contrast  Result Date: 01/14/2023 CLINICAL DATA:  87 year old male status post fall striking head. EXAM: CT THORACIC SPINE WITHOUT CONTRAST TECHNIQUE: Multidetector CT images of the thoracic were obtained using the standard protocol without intravenous contrast. RADIATION DOSE REDUCTION: This exam was performed according to the departmental dose-optimization program which includes automated exposure control, adjustment of the mA and/or kV according to patient size and/or use of iterative reconstruction technique. COMPARISON:  Cervical spine CT today reported separately. Previous chest CT 09/07/2009. FINDINGS: Limited cervical spine imaging: Reported separately today. Thoracic spine segmentation:  Normal. Alignment: Mildly exaggerated upper thoracic kyphosis, and mild midthoracic dextroconvex scoliosis, have not significantly changed from 2011. Vertebrae: Stable thoracic vertebral height since 2011. No thoracic vertebral fracture identified. Visible upper lumbar levels also appear grossly intact. Visible posterior ribs also appear intact. No acute osseous abnormality identified. Paraspinal and other soft tissues: Calcified coronary artery atherosclerosis and/or stents. Calcified aortic atherosclerosis. No pericardial or pleural effusion. No evidence of mediastinal mass or lymphadenopathy. Punctate cholelithiasis series 3, image 162. Mild renal vascular calcifications. Lower lung volumes with symmetric mild atelectasis and respiratory motion artifact. Thoracic paraspinal soft tissues are within normal limits. Disc levels: Chronic thoracic spine degeneration, especially thoracic disc and endplate degeneration from T6-T7 (vacuum disc) through T9-T10 which has progressed since 2011. However, mild if any associated thoracic spinal stenosis. IMPRESSION: 1. No acute traumatic injury identified in the Thoracic Spine. 2. Chronic thoracic spine degeneration with progression since 2011.  Electronically Signed   By: Odessa Fleming M.D.   On: 01/14/2023 04:38   CT Cervical Spine Wo Contrast  Result Date: 01/14/2023 CLINICAL DATA:  87 year old male status post fall striking head. EXAM: CT CERVICAL SPINE WITHOUT CONTRAST TECHNIQUE: Multidetector CT imaging of the cervical spine was performed without intravenous contrast. Multiplanar CT image reconstructions were also generated. RADIATION DOSE REDUCTION: This exam was performed according to the departmental dose-optimization program which includes automated exposure control, adjustment of the mA and/or kV according to patient size and/or use of iterative reconstruction technique. COMPARISON:  Head CT today.  Brain MRI 12/28/2021. FINDINGS: Alignment: Phys ir cervical lordosis. Mild degenerative appearing anterolisthesis of C7 on T1, with associated facet arthropathy. Skull base and vertebrae: Bone mineralization is within normal limits for age. Visualized skull base is intact. No atlanto-occipital dissociation. C1 and C2 appear intact and aligned. No acute osseous abnormality identified. Soft tissues and spinal canal: No prevertebral fluid or swelling. No visible canal hematoma. Negative visible noncontrast neck soft tissues except for calcified carotid atherosclerosis. Disc levels: Widespread advanced cervical disc and endplate degeneration. Mild multilevel cervical spinal stenosis suspected, perhaps most pronounced at C6-C7. Upper chest: Grossly intact visible upper thoracic levels with similar disc and endplate degeneration. Mild apical lung scarring. Thoracic spine is detailed separately. IMPRESSION: 1. No acute traumatic injury identified in the cervical spine. 2. Advanced cervical spine degeneration with multilevel degenerative spinal stenosis suspected. Electronically Signed   By: Odessa Fleming M.D.   On: 01/14/2023 04:32   CT Head Wo Contrast  Result Date: 01/14/2023 CLINICAL DATA:  87 year old male status post fall striking head. EXAM: CT HEAD  WITHOUT CONTRAST TECHNIQUE: Contiguous axial images were obtained from the base of the skull through the vertex without intravenous contrast. RADIATION DOSE REDUCTION: This exam was performed according to the departmental dose-optimization program which includes automated exposure control, adjustment of the mA and/or kV according to patient size and/or use of iterative reconstruction technique. COMPARISON:  Brain MRI 12/28/2021.  Head CT 04/25/2005. FINDINGS: Brain: Stable cerebral volume from the MRI last year. No midline shift, mass effect, or evidence of intracranial mass lesion. Stable ventricle size and configuration. No acute intracranial hemorrhage identified. Patchy and confluent bilateral cerebral white matter hypodensity appears stable 2 MRI findings last year. No cortically based acute infarct identified. Vascular: No suspicious intracranial vascular hyperdensity. Calcified atherosclerosis at the skull base. Skull: No skull fracture identified. Sinuses/Orbits: Chronic sphenoid sinusitis appears stable from the MRI last year, periosteal thickening associated. Left mastoid and left middle ear opacification are increased since that time. Right tympanic cavity, mastoids, and remaining paranasal sinuses are well aerated. Other: No discrete orbit or scalp soft tissue injury identified. IMPRESSION: 1.  No acute traumatic injury identified. 2. Chronic sphenoid sinusitis but increased left mastoid and middle ear opacification since last year. Consider otitis media with mastoid effusion in the appropriate clinical setting. 3. Chronic white matter disease.  No acute intracranial abnormality. Electronically Signed   By: Odessa Fleming M.D.   On: 01/14/2023 04:29    Procedures Procedures    Medications Ordered in ED Medications - No data to display  ED Course/ Medical Decision Making/ A&P                             Medical Decision Making Amount and/or Complexity of Data Reviewed Independent Historian:  EMS Labs: ordered. Decision-making details documented in ED Course. Radiology: ordered and independent interpretation performed. Decision-making details documented in ED Course. ECG/medicine tests: ordered and independent interpretation performed. Decision-making details documented in ED Course.   Fall with head and neck injury.  No loss of consciousness.  No blood thinner use.  5/5 strength throughout.  GCS is 15, ABCs are intact.  Complains of neck pain and upper back pain only.  No focal neurological deficits.  EKG is sinus rhythm with LVH and no acute ST changes.  Denies chest pain.  Workup is remarkable for negative traumatic imaging of head, C-spine and thoracic spine.  Results reviewed and interpreted by me.  CT  head does show some chronic sinus changes.  No C-spine fracture identified.  Patient's pain has resolved.  C-collar is removed.  He is able to ambulate with assistance which is his baseline.  Denies dizziness or lightheadedness.  Labs show mild AKI of creatinine 1.6.  Troponin minimally elevated but flat.  Low suspicion for ACS.  Patient feels improved.  Denies neck pain, chest pain, dizziness or lightheadedness.  Discussed supportive care at home Tylenol as needed for pain follow-up with PCP.  Return precautions discussed.       Final Clinical Impression(s) / ED Diagnoses Final diagnoses:  Fall, initial encounter    Rx / DC Orders ED Discharge Orders     None         Tavares Levinson, Jeannett Senior, MD 01/14/23 [phone]

## 2023-01-19 ENCOUNTER — Other Ambulatory Visit: Payer: Self-pay | Admitting: Internal Medicine

## 2023-01-19 ENCOUNTER — Encounter: Payer: Self-pay | Admitting: Internal Medicine

## 2023-01-19 ENCOUNTER — Non-Acute Institutional Stay (SKILLED_NURSING_FACILITY): Payer: PPO | Admitting: Internal Medicine

## 2023-01-19 DIAGNOSIS — E113293 Type 2 diabetes mellitus with mild nonproliferative diabetic retinopathy without macular edema, bilateral: Secondary | ICD-10-CM | POA: Diagnosis not present

## 2023-01-19 DIAGNOSIS — I10 Essential (primary) hypertension: Secondary | ICD-10-CM | POA: Diagnosis not present

## 2023-01-19 DIAGNOSIS — G309 Alzheimer's disease, unspecified: Secondary | ICD-10-CM | POA: Diagnosis not present

## 2023-01-19 DIAGNOSIS — R2681 Unsteadiness on feet: Secondary | ICD-10-CM

## 2023-01-19 DIAGNOSIS — E785 Hyperlipidemia, unspecified: Secondary | ICD-10-CM

## 2023-01-19 DIAGNOSIS — I2510 Atherosclerotic heart disease of native coronary artery without angina pectoris: Secondary | ICD-10-CM | POA: Diagnosis not present

## 2023-01-19 DIAGNOSIS — Z794 Long term (current) use of insulin: Secondary | ICD-10-CM

## 2023-01-19 DIAGNOSIS — F02818 Dementia in other diseases classified elsewhere, unspecified severity, with other behavioral disturbance: Secondary | ICD-10-CM

## 2023-01-19 MED ORDER — ZOLPIDEM TARTRATE 5 MG PO TABS: 5.0000 mg | ORAL_TABLET | Freq: Every day | ORAL | 0 refills | Status: DC

## 2023-01-19 NOTE — Progress Notes (Signed)
Provider:  Einar Crow, MD Location:   [facility] Room Number: [phone] Place of Service:  SNF ([phone])  PCP: Mahlon Gammon, MD Patient Care Team: Mahlon Gammon, MD as PCP - General (Internal Medicine) Kathleene Hazel, MD as PCP - Cardiology (Cardiology) Drema Dallas, DO as Consulting Physician (Neurology)  Extended Emergency Contact Information Primary Emergency Contact: [facility] Address: [address]          [place], [place] [address] [place] Home Phone: [phone] Mobile Phone: [phone] Relation: Daughter Secondary Emergency Contact: [facility] Phone: [phone] Relation: Spouse Preferred language: English Interpreter needed? No  Code Status: DNR Goals of Care: Advanced Directive information    01/19/2023   10:40 AM  Advanced Directives  Does Patient Have a Medical Advance Directive? Yes  Type of Advance Directive Out of facility DNR (pink MOST or yellow form)  Does patient want to make changes to medical advance directive? No - Patient declined      Chief Complaint  Patient presents with   New Admit To SNF    Admission to SNF   Immunizations    Shingrix vaccine, Tdap vaccine and COVID booster due.   Quality Metric Gaps    Diabetic kidney evaluation and Medicare annual wellness due    HPI: Patient is a 87 y.o. male seen today for admission to Memory unit  Patient was living with his wife with 24/7 caregivers.   Daughter in town who is Primary Caregiver Patient had 2 episodes f wandering [facility].  It was decided to move him to memory unit.   Patient has diagnosis of  Diabetes Type 2  Was injecting himself at home.  Was not checking his CBGs. Per nurse this morning his CBG was 67.  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  Both Urinary and Bowel  Incontinence  CAD S/P Stent placement No Active symptoms HLD,Gait Instability  Peripheral Neuropathy, Anemia, OSA falied CPAP   Now admitted in Memory unit No issues today per nurses Wife constantly comes and visits Past Medical History:  Diagnosis Date   CAD (coronary artery disease)    Bare meta stent RCA   Carotid stenosis    a. Carotid US 3/17: bilateral ICA 1-39% >> repeat 2 years   Coronary atherosclerosis of native coronary artery 08/24/2008   S/P PCI   Dementia 12/15/2021   concerns for Alzheimer's disease   Essential hypertension, benign 08/24/2008   Fatigue 08/08/2010   History of nuclear stress test    Myoview 12/16: EF 66%, very mild inferior ischemia; Low Risk   Mixed hyperlipidemia 08/24/2008   Myocardial infarction 10/06/2007   Nonproliferative diabetic retinopathy 08/08/2011   Nuclear sclerosis of both eyes 12/01/2017   Peripheral neuropathy    Pulmonary nodule, right 09/13/2007   Right upper lobe         Sinoatrial node dysfunction 08/24/2008   Chronotropic incompetence   Snoring 09/27/2009   Type II diabetes mellitus 08/08/2011   Past Surgical History:  Procedure Laterality Date   LEFT HEART CATHETERIZATION WITH CORONARY ANGIOGRAM N/A 03/04/2012   Procedure: LEFT HEART CATHETERIZATION WITH CORONARY ANGIOGRAM;  Surgeon: Kathleene Hazel, MD;  Location: [facility] CATH LAB;  Service: Cardiovascular;  Laterality: N/A;   TONSILLECTOMY      reports that he quit smoking about 50 years ago. His smoking use included cigarettes. He  started smoking about 60 years ago. He has a 5 pack-year smoking history. He has never used smokeless tobacco. He reports that he does not currently use alcohol. He reports that he does not use drugs. Social History   Socioeconomic History   Marital status: Married    Spouse name: Thurston Hole   Number of children: 3   Years of education: 15   Highest education level: Some college, no degree  Occupational History   Occupation: Retired     Comment: IT trainer  Tobacco Use   Smoking status: Former    Current packs/day: 0.00    Average packs/day: 0.5 packs/day for 10.0 years (5.0 ttl pk-yrs)    Types: Cigarettes    Start date: 02/04/1962    Quit date: 02/05/1972    Years since quitting: 50.9   Smokeless tobacco: Never  Vaping Use   Vaping status: Never Used  Substance and Sexual Activity   Alcohol use: Not Currently    Comment: rare   Drug use: No   Sexual activity: Not on file  Other Topics Concern   Not on file  Social History Narrative   Married, lives with wife in a one story home. [facility])  Pt is right handed. He drinks hot tea 5x week.   Social Determinants of Health   Financial Resource Strain: Not on file  Food Insecurity: Not on file  Transportation Needs: Not on file  Physical Activity: Not on file  Stress: Not on file  Social Connections: Unknown (11/19/2021)   [facility]   Social Network    Social Network: Not on file  Intimate Partner Violence: Unknown (10/11/2021)   Received from [facility]   HITS    Physically Hurt: Not on file    Insult or Talk Down To: Not on file    Threaten Physical Harm: Not on file    Scream or Curse: Not on file    Functional Status Survey:    Family History  Problem Relation Age of Onset   Dementia Mother        memory loss   Heart failure Mother    Stroke Mother    Heart attack Father 36   Dementia Brother        suspected Lewy body    Health Maintenance  Topic Date Due   Diabetic kidney evaluation - Urine ACR  Never done   Zoster Vaccines- Shingrix (1 of 2) 08/22/1954   DTaP/Tdap/Td (3 - Td or Tdap) 03/18/2021   COVID-19 Vaccine (6 - 2023-24 season) 07/02/2022   Medicare Annual Wellness (AWV)  12/07/2022   INFLUENZA VACCINE  02/05/2023   HEMOGLOBIN A1C  02/17/2023   OPHTHALMOLOGY EXAM  07/30/2023   FOOT EXAM  10/07/2023   Diabetic kidney evaluation - eGFR measurement  01/14/2024   Pneumonia Vaccine 57+ Years old  Completed   HPV  VACCINES  Aged Out    Allergies  Allergen Reactions   Ace Inhibitors    Alpha Lipoic Acid [Lipoic Acid]     Upset Stomach   Invokana [Canagliflozin]    Heparin Hives   Isosorbide Mononitrate [Isosorbide Dinitrate Er] Hives   Penicillins Hives   Sulfonamide Derivatives Hives    Allergies as of 01/19/2023       Reactions   Ace Inhibitors    Alpha Lipoic Acid [lipoic Acid]    Upset Stomach   Invokana [canagliflozin]    Heparin Hives   Isosorbide Mononitrate [isosorbide Dinitrate Er] Hives   Penicillins Hives  Sulfonamide Derivatives Hives        Medication List        Accurate as of January 19, 2023 10:54 AM. If you have any questions, ask your nurse or doctor.          amLODipine 10 MG tablet Commonly known as: NORVASC TAKE 1 TABLET ONCE DAILY.   aspirin 81 MG tablet Take 1 tablet (81 mg total) by mouth daily.   atorvastatin 10 MG tablet Commonly known as: LIPITOR Take 10 mg by mouth daily.   citalopram 20 MG tablet Commonly known as: CELEXA Take 20 mg by mouth daily.   famotidine 20 MG tablet Commonly known as: PEPCID Take 20 mg by mouth 2 (two) times daily.   ferrous sulfate 325 (65 FE) MG EC tablet Take 325 mg by mouth 3 (three) times a week.   hydrochlorothiazide 25 MG tablet Commonly known as: HYDRODIURIL TAKE 1 TABLET ONCE DAILY.   hydrOXYzine 25 MG capsule Commonly known as: VISTARIL Take 1 capsule (25 mg total) by mouth 2 (two) times daily as needed (increased agitation/ sundowning).   Lantus SoloStar 100 UNIT/ML Solostar Pen Generic drug: insulin glargine Inject 45 Units into the skin every morning.   latanoprost 0.005 % ophthalmic solution Commonly known as: XALATAN Place 1 drop into both eyes at bedtime.   memantine 10 MG tablet Commonly known as: NAMENDA TAKE ONE TABLET BY MOUTH EVERY MORNING and TAKE ONE TABLET BY MOUTH EVERYDAY AT BEDTIME   metformin 500 MG (OSM) 24 hr tablet Commonly known as: FORTAMET Take 500 mg by mouth  2 (two) times daily with a meal.   rivastigmine 1.5 MG capsule Commonly known as: EXELON TAKE ONE CAPSULE BY MOUTH TWICE DAILY   sitaGLIPtin 50 MG tablet Commonly known as: JANUVIA Take 50 mg by mouth daily.   spironolactone 25 MG tablet Commonly known as: ALDACTONE Take 25 mg by mouth daily.   Vitamin B 12 500 MCG Tabs Take 2 tablets by mouth every other day. What changed: Another medication with the same name was removed. Continue taking this medication, and follow the directions you see here. Changed by: Prabhav Faulkenberry L Eara Burruel   zolpidem 5 MG tablet Commonly known as: AMBIEN Take 5 mg by mouth at bedtime. For sleep        Review of Systems  Unable to perform ROS: Dementia    Vitals:   01/19/23 1036  BP: 119/86  Pulse: 72  Resp: 18  Temp: (!) 97.3 F (36.3 C)  SpO2: 93%  Weight: 182 lb 9.6 oz (82.8 kg)  Height: 6\' 2"  (1.88 m)   Body mass index is 23.44 kg/m. Physical Exam Vitals reviewed.  Constitutional:      Appearance: Normal appearance.  HENT:     Head: Normocephalic.     Nose: Nose normal.     Mouth/Throat:     Mouth: Mucous membranes are moist.     Pharynx: Oropharynx is clear.  Eyes:     Pupils: Pupils are equal, round, and reactive to light.  Cardiovascular:     Rate and Rhythm: Normal rate and regular rhythm.     Pulses: Normal pulses.     Heart sounds: Murmur heard.  Pulmonary:     Effort: Pulmonary effort is normal. No respiratory distress.     Breath sounds: Normal breath sounds. No rales.  Abdominal:     General: Abdomen is flat. Bowel sounds are normal.     Palpations: Abdomen is soft.  Musculoskeletal:  General: Swelling present.     Cervical back: Neck supple.  Skin:    General: Skin is warm.  Neurological:     General: No focal deficit present.     Mental Status: He is alert.     Comments: Has mild aphasia Pleasantly confused  Psychiatric:        Mood and Affect: Mood normal.        Thought Content: Thought content normal.      Labs reviewed: Basic Metabolic Panel: Recent Labs    08/19/22 0000 01/14/23 0410  NA 138 140  K 4.5 4.3  CL 101 107  CO2  --  23  GLUCOSE  --  93  BUN 27* 30*  CREATININE 1.3 1.61*  CALCIUM 9.1 9.1   Liver Function Tests: Recent Labs    08/19/22 0000  AST 27  ALT 22  ALBUMIN 4.0   No results for input(s): "LIPASE", "AMYLASE" in the last 8760 hours. No results for input(s): "AMMONIA" in the last 8760 hours. CBC: Recent Labs    08/19/22 0000 01/14/23 0410  WBC 6.0 8.5  NEUTROABS  --  6.7  HGB 12.5* 12.1*  HCT 37* 35.9*  MCV  --  94.7  PLT 261 217   Cardiac Enzymes: No results for input(s): "CKTOTAL", "CKMB", "CKMBINDEX", "TROPONINI" in the last 8760 hours. BNP: Invalid input(s): "POCBNP" Lab Results  Component Value Date   HGBA1C 7.7 08/19/2022   Lab Results  Component Value Date   TSH 2.58 08/19/2022   Lab Results  Component Value Date   VITAMINB12 >1504 (H) 12/13/2021   No results found for: "FOLATE" Lab Results  Component Value Date   IRON 54 01/13/2011   TIBC 310 01/13/2011   FERRITIN 45 01/13/2011    Imaging and Procedures obtained prior to SNF admission: CT Thoracic Spine Wo Contrast  Result Date: 01/14/2023 CLINICAL DATA:  87 year old male status post fall striking head. EXAM: CT THORACIC SPINE WITHOUT CONTRAST TECHNIQUE: Multidetector CT images of the thoracic were obtained using the standard protocol without intravenous contrast. RADIATION DOSE REDUCTION: This exam was performed according to the departmental dose-optimization program which includes automated exposure control, adjustment of the mA and/or kV according to patient size and/or use of iterative reconstruction technique. COMPARISON:  Cervical spine CT today reported separately. Previous chest CT 09/07/2009. FINDINGS: Limited cervical spine imaging: Reported separately today. Thoracic spine segmentation:  Normal. Alignment: Mildly exaggerated upper thoracic kyphosis, and mild  midthoracic dextroconvex scoliosis, have not significantly changed from 2011. Vertebrae: Stable thoracic vertebral height since 2011. No thoracic vertebral fracture identified. Visible upper lumbar levels also appear grossly intact. Visible posterior ribs also appear intact. No acute osseous abnormality identified. Paraspinal and other soft tissues: Calcified coronary artery atherosclerosis and/or stents. Calcified aortic atherosclerosis. No pericardial or pleural effusion. No evidence of mediastinal mass or lymphadenopathy. Punctate cholelithiasis series 3, image 162. Mild renal vascular calcifications. Lower lung volumes with symmetric mild atelectasis and respiratory motion artifact. Thoracic paraspinal soft tissues are within normal limits. Disc levels: Chronic thoracic spine degeneration, especially thoracic disc and endplate degeneration from T6-T7 (vacuum disc) through T9-T10 which has progressed since 2011. However, mild if any associated thoracic spinal stenosis. IMPRESSION: 1. No acute traumatic injury identified in the Thoracic Spine. 2. Chronic thoracic spine degeneration with progression since 2011. Electronically Signed   By: Odessa Fleming M.D.   On: 01/14/2023 04:38   CT Cervical Spine Wo Contrast  Result Date: 01/14/2023 CLINICAL DATA:  87 year old male status post fall striking  head. EXAM: CT CERVICAL SPINE WITHOUT CONTRAST TECHNIQUE: Multidetector CT imaging of the cervical spine was performed without intravenous contrast. Multiplanar CT image reconstructions were also generated. RADIATION DOSE REDUCTION: This exam was performed according to the departmental dose-optimization program which includes automated exposure control, adjustment of the mA and/or kV according to patient size and/or use of iterative reconstruction technique. COMPARISON:  Head CT today.  Brain MRI 12/28/2021. FINDINGS: Alignment: Phys ir cervical lordosis. Mild degenerative appearing anterolisthesis of C7 on T1, with associated  facet arthropathy. Skull base and vertebrae: Bone mineralization is within normal limits for age. Visualized skull base is intact. No atlanto-occipital dissociation. C1 and C2 appear intact and aligned. No acute osseous abnormality identified. Soft tissues and spinal canal: No prevertebral fluid or swelling. No visible canal hematoma. Negative visible noncontrast neck soft tissues except for calcified carotid atherosclerosis. Disc levels: Widespread advanced cervical disc and endplate degeneration. Mild multilevel cervical spinal stenosis suspected, perhaps most pronounced at C6-C7. Upper chest: Grossly intact visible upper thoracic levels with similar disc and endplate degeneration. Mild apical lung scarring. Thoracic spine is detailed separately. IMPRESSION: 1. No acute traumatic injury identified in the cervical spine. 2. Advanced cervical spine degeneration with multilevel degenerative spinal stenosis suspected. Electronically Signed   By: Odessa Fleming M.D.   On: 01/14/2023 04:32   CT Head Wo Contrast  Result Date: 01/14/2023 CLINICAL DATA:  87 year old male status post fall striking head. EXAM: CT HEAD WITHOUT CONTRAST TECHNIQUE: Contiguous axial images were obtained from the base of the skull through the vertex without intravenous contrast. RADIATION DOSE REDUCTION: This exam was performed according to the departmental dose-optimization program which includes automated exposure control, adjustment of the mA and/or kV according to patient size and/or use of iterative reconstruction technique. COMPARISON:  Brain MRI 12/28/2021.  Head CT 04/25/2005. FINDINGS: Brain: Stable cerebral volume from the MRI last year. No midline shift, mass effect, or evidence of intracranial mass lesion. Stable ventricle size and configuration. No acute intracranial hemorrhage identified. Patchy and confluent bilateral cerebral white matter hypodensity appears stable 2 MRI findings last year. No cortically based acute infarct  identified. Vascular: No suspicious intracranial vascular hyperdensity. Calcified atherosclerosis at the skull base. Skull: No skull fracture identified. Sinuses/Orbits: Chronic sphenoid sinusitis appears stable from the MRI last year, periosteal thickening associated. Left mastoid and left middle ear opacification are increased since that time. Right tympanic cavity, mastoids, and remaining paranasal sinuses are well aerated. Other: No discrete orbit or scalp soft tissue injury identified. IMPRESSION: 1.  No acute traumatic injury identified. 2. Chronic sphenoid sinusitis but increased left mastoid and middle ear opacification since last year. Consider otitis media with mastoid effusion in the appropriate clinical setting. 3. Chronic white matter disease.  No acute intracranial abnormality. Electronically Signed   By: Odessa Fleming M.D.   On: 01/14/2023 04:29    Assessment/Plan 1. 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, with long-term current use of insulin, macular edema presence unspecified (HCC) Check CBG QAC and HS for 2 weeks To reval his BS Will change his Lantus to 45 units from 65 units Also Change Januvia to 50 mg He is also n Metformin Nurses to let me know if CBGS more then 200  2. Alzheimer's dementia with behavioral disturbance (HCC) On Namenda and Exelon Also on Vistaril Needs SNF level care  3. Essential hypertension, benign On Norvasc and hydrochlorothiazide   4. Coronary artery disease involving native coronary artery of native heart without angina pectoris  On aspirin and Statin  5. Hyperlipidemia, unspecified hyperlipidemia type Statin LDL 80  6. Gait instability Walker 7 CKD Repeat Labs 8 Depression On Celexa 9 LE edema On hydrochlorothiazide and Aldactone Not sure was compliant at home Will watch here 10 Anemia HGB stable on iron  Family/ staff Communication:   Labs/tests ordered: BMP in 1 week

## 2023-01-26 ENCOUNTER — Encounter: Payer: Self-pay | Admitting: Internal Medicine

## 2023-01-26 ENCOUNTER — Non-Acute Institutional Stay: Payer: Self-pay | Admitting: Internal Medicine

## 2023-01-26 DIAGNOSIS — G309 Alzheimer's disease, unspecified: Secondary | ICD-10-CM

## 2023-01-26 DIAGNOSIS — E113293 Type 2 diabetes mellitus with mild nonproliferative diabetic retinopathy without macular edema, bilateral: Secondary | ICD-10-CM

## 2023-01-26 DIAGNOSIS — I10 Essential (primary) hypertension: Secondary | ICD-10-CM

## 2023-01-26 DIAGNOSIS — F02818 Dementia in other diseases classified elsewhere, unspecified severity, with other behavioral disturbance: Secondary | ICD-10-CM

## 2023-01-26 DIAGNOSIS — I2510 Atherosclerotic heart disease of native coronary artery without angina pectoris: Secondary | ICD-10-CM | POA: Diagnosis not present

## 2023-01-26 DIAGNOSIS — Z794 Long term (current) use of insulin: Secondary | ICD-10-CM

## 2023-01-26 NOTE — Progress Notes (Signed)
Location: [facility] illustr[facility] of Service:  SNF ([id number])  Provider:   Code Status: DNR Goals of Care:     01/19/2023   10:40 AM  Advanced Directives  Does Patient Have a Medical Advance Directive? Yes  Type of Advance Directive Out of facility DNR (pink MOST or yellow form)  Does patient want to make changes to medical advance directive? No - Patient declined     Chief Complaint  Patient presents with   Acute Visit    HPI: Patient is a 87 y.o. male seen today for an acute visit for Low CBG and Behaviors  Patient was living with his wife with 24/7 caregivers.   Daughter in town who is Primary Caregiver Patient had 2 episodes f wandering in [facility].  It was decided to move him to memory unit.   Patient has diagnosis of  Diabetes Type 2  Was injecting himself at home.  Was not checking his CBGs.This morning his CBG was 53 He was sleepy Repeat after breakfast was 124 Alzheimer Dementia MRI shows Advanced Atrophy Last MMSE 23/30 per Neurology  On Namenda and Exelon.  Having behavior issues at night Packing his stuff trying to leave    Other issues Urinary and Bowel Incontinence  CAD S/P Stent placement No Active symptoms HLD,Gait Instability   Peripheral Neuropathy, Anemia, OSA falied CPAP    Past Medical History:  Diagnosis Date   CAD (coronary artery disease)    Bare meta stent RCA   Carotid stenosis    a. Carotid US 3/17: bilateral ICA 1-39% >> repeat 2 years   Coronary atherosclerosis of native coronary artery 08/24/2008   S/P PCI   Dementia 12/15/2021   concerns for Alzheimer's disease   Essential hypertension, benign 08/24/2008   Fatigue 08/08/2010   History of nuclear stress test    Myoview 12/16: EF 66%, very mild inferior ischemia; Low Risk   Mixed hyperlipidemia 08/24/2008   Myocardial infarction 10/06/2007   Nonproliferative diabetic retinopathy 08/08/2011   Nuclear sclerosis of both eyes 12/01/2017   Peripheral neuropathy     Pulmonary nodule, right 09/13/2007   Right upper lobe         Sinoatrial node dysfunction 08/24/2008   Chronotropic incompetence   Snoring 09/27/2009   Type II diabetes mellitus 08/08/2011    Past Surgical History:  Procedure Laterality Date   LEFT HEART CATHETERIZATION WITH CORONARY ANGIOGRAM N/A 03/04/2012   Procedure: LEFT HEART CATHETERIZATION WITH CORONARY ANGIOGRAM;  Surgeon: Kathleene Hazel, MD;  Location: [facility] CATH LAB;  Service: Cardiovascular;  Laterality: N/A;   TONSILLECTOMY      Allergies  Allergen Reactions   Ace Inhibitors    Alpha Lipoic Acid [Lipoic Acid]     Upset Stomach   Invokana [Canagliflozin]    Heparin Hives   Isosorbide Mononitrate [Isosorbide Dinitrate Er] Hives   Penicillins Hives   Sulfonamide Derivatives Hives    Outpatient Encounter Medications as of 01/26/2023  Medication Sig   amLODipine (NORVASC) 10 MG tablet TAKE 1 TABLET ONCE DAILY.   aspirin 81 MG tablet Take 1 tablet (81 mg total) by mouth daily.   atorvastatin (LIPITOR) 10 MG tablet Take 10 mg by mouth daily.   citalopram (CELEXA) 20 MG tablet Take 20 mg by mouth daily.   Cyanocobalamin (VITAMIN B 12) 500 MCG TABS Take 2 tablets by mouth every other day.   famotidine (PEPCID) 20 MG tablet Take 20 mg by mouth 2 (two) times daily.   ferrous sulfate  325 (65 FE) MG EC tablet Take 325 mg by mouth 3 (three) times a week.   hydrochlorothiazide (HYDRODIURIL) 25 MG tablet TAKE 1 TABLET ONCE DAILY.   hydrOXYzine (VISTARIL) 25 MG capsule Take 1 capsule (25 mg total) by mouth 2 (two) times daily as needed (increased agitation/ sundowning).   LANTUS SOLOSTAR 100 UNIT/ML injection Inject 45 Units into the skin every morning.   latanoprost (XALATAN) 0.005 % ophthalmic solution Place 1 drop into both eyes at bedtime.   memantine (NAMENDA) 10 MG tablet TAKE ONE TABLET BY MOUTH EVERY MORNING and TAKE ONE TABLET BY MOUTH EVERYDAY AT BEDTIME   metformin (FORTAMET) 500 MG (OSM) 24 hr tablet Take 500  mg by mouth 2 (two) times daily with a meal.   rivastigmine (EXELON) 1.5 MG capsule TAKE ONE CAPSULE BY MOUTH TWICE DAILY   sitaGLIPtin (JANUVIA) 50 MG tablet Take 50 mg by mouth daily.   spironolactone (ALDACTONE) 25 MG tablet Take 25 mg by mouth daily.   zolpidem (AMBIEN) 5 MG tablet Take 1 tablet (5 mg total) by mouth at bedtime. For sleep   No facility-administered encounter medications on file as of 01/26/2023.    Review of Systems:  Review of Systems  Unable to perform ROS: Dementia    Health Maintenance  Topic Date Due   Diabetic kidney evaluation - Urine ACR  Never done   Zoster Vaccines- Shingrix (1 of 2) 08/22/1954   DTaP/Tdap/Td (3 - Td or Tdap) 03/18/2021   COVID-19 Vaccine (6 - 2023-24 season) 07/02/2022   Medicare Annual Wellness (AWV)  12/07/2022   INFLUENZA VACCINE  02/05/2023   HEMOGLOBIN A1C  02/17/2023   OPHTHALMOLOGY EXAM  07/30/2023   FOOT EXAM  10/07/2023   Diabetic kidney evaluation - eGFR measurement  01/14/2024   Pneumonia Vaccine 73+ Years old  Completed   HPV VACCINES  Aged Out    Physical Exam: Vitals:   01/26/23 1557  BP: (!) 146/78  Pulse: 64  Resp: 18  Temp: (!) 97.3 F (36.3 C)  Weight: 182 lb 9.6 oz (82.8 kg)   Body mass index is 23.44 kg/m. Physical Exam Vitals reviewed.  Constitutional:      Appearance: Normal appearance.  HENT:     Head: Normocephalic.     Nose: Nose normal.     Mouth/Throat:     Mouth: Mucous membranes are moist.     Pharynx: Oropharynx is clear.  Eyes:     Pupils: Pupils are equal, round, and reactive to light.  Cardiovascular:     Rate and Rhythm: Normal rate and regular rhythm.     Pulses: Normal pulses.     Heart sounds: No murmur heard. Pulmonary:     Effort: Pulmonary effort is normal. No respiratory distress.     Breath sounds: Normal breath sounds. No rales.  Abdominal:     General: Abdomen is flat. Bowel sounds are normal.     Palpations: Abdomen is soft.  Musculoskeletal:        General: No  swelling.     Cervical back: Neck supple.  Skin:    General: Skin is warm.  Neurological:     General: No focal deficit present.     Mental Status: He is alert.  Psychiatric:        Mood and Affect: Mood normal.        Thought Content: Thought content normal.     Labs reviewed: Basic Metabolic Panel: Recent Labs    08/19/22 0000 01/14/23 0410  NA 138 140  K 4.5 4.3  CL 101 107  CO2  --  23  GLUCOSE  --  93  BUN 27* 30*  CREATININE 1.3 1.61*  CALCIUM 9.1 9.1  TSH 2.58  --    Liver Function Tests: Recent Labs    08/19/22 0000  AST 27  ALT 22  ALBUMIN 4.0   No results for input(s): "LIPASE", "AMYLASE" in the last 8760 hours. No results for input(s): "AMMONIA" in the last 8760 hours. CBC: Recent Labs    08/19/22 0000 01/14/23 0410  WBC 6.0 8.5  NEUTROABS  --  6.7  HGB 12.5* 12.1*  HCT 37* 35.9*  MCV  --  94.7  PLT 261 217   Lipid Panel: Recent Labs    08/19/22 0000  CHOL 136  HDL 37  LDLCALC 80  TRIG 96   Lab Results  Component Value Date   HGBA1C 7.7 08/19/2022    Procedures since last visit: CT Thoracic Spine Wo Contrast  Result Date: 01/14/2023 CLINICAL DATA:  87 year old male status post fall striking head. EXAM: CT THORACIC SPINE WITHOUT CONTRAST TECHNIQUE: Multidetector CT images of the thoracic were obtained using the standard protocol without intravenous contrast. RADIATION DOSE REDUCTION: This exam was performed according to the departmental dose-optimization program which includes automated exposure control, adjustment of the mA and/or kV according to patient size and/or use of iterative reconstruction technique. COMPARISON:  Cervical spine CT today reported separately. Previous chest CT 09/07/2009. FINDINGS: Limited cervical spine imaging: Reported separately today. Thoracic spine segmentation:  Normal. Alignment: Mildly exaggerated upper thoracic kyphosis, and mild midthoracic dextroconvex scoliosis, have not significantly changed from 2011.  Vertebrae: Stable thoracic vertebral height since 2011. No thoracic vertebral fracture identified. Visible upper lumbar levels also appear grossly intact. Visible posterior ribs also appear intact. No acute osseous abnormality identified. Paraspinal and other soft tissues: Calcified coronary artery atherosclerosis and/or stents. Calcified aortic atherosclerosis. No pericardial or pleural effusion. No evidence of mediastinal mass or lymphadenopathy. Punctate cholelithiasis series 3, image 162. Mild renal vascular calcifications. Lower lung volumes with symmetric mild atelectasis and respiratory motion artifact. Thoracic paraspinal soft tissues are within normal limits. Disc levels: Chronic thoracic spine degeneration, especially thoracic disc and endplate degeneration from T6-T7 (vacuum disc) through T9-T10 which has progressed since 2011. However, mild if any associated thoracic spinal stenosis. IMPRESSION: 1. No acute traumatic injury identified in the Thoracic Spine. 2. Chronic thoracic spine degeneration with progression since 2011. Electronically Signed   By: Odessa Fleming M.D.   On: 01/14/2023 04:38   CT Cervical Spine Wo Contrast  Result Date: 01/14/2023 CLINICAL DATA:  87 year old male status post fall striking head. EXAM: CT CERVICAL SPINE WITHOUT CONTRAST TECHNIQUE: Multidetector CT imaging of the cervical spine was performed without intravenous contrast. Multiplanar CT image reconstructions were also generated. RADIATION DOSE REDUCTION: This exam was performed according to the departmental dose-optimization program which includes automated exposure control, adjustment of the mA and/or kV according to patient size and/or use of iterative reconstruction technique. COMPARISON:  Head CT today.  Brain MRI 12/28/2021. FINDINGS: Alignment: Phys ir cervical lordosis. Mild degenerative appearing anterolisthesis of C7 on T1, with associated facet arthropathy. Skull base and vertebrae: Bone mineralization is within  normal limits for age. Visualized skull base is intact. No atlanto-occipital dissociation. C1 and C2 appear intact and aligned. No acute osseous abnormality identified. Soft tissues and spinal canal: No prevertebral fluid or swelling. No visible canal hematoma. Negative visible noncontrast neck soft tissues except for calcified carotid  atherosclerosis. Disc levels: Widespread advanced cervical disc and endplate degeneration. Mild multilevel cervical spinal stenosis suspected, perhaps most pronounced at C6-C7. Upper chest: Grossly intact visible upper thoracic levels with similar disc and endplate degeneration. Mild apical lung scarring. Thoracic spine is detailed separately. IMPRESSION: 1. No acute traumatic injury identified in the cervical spine. 2. Advanced cervical spine degeneration with multilevel degenerative spinal stenosis suspected. Electronically Signed   By: Odessa Fleming M.D.   On: 01/14/2023 04:32   CT Head Wo Contrast  Result Date: 01/14/2023 CLINICAL DATA:  87 year old male status post fall striking head. EXAM: CT HEAD WITHOUT CONTRAST TECHNIQUE: Contiguous axial images were obtained from the base of the skull through the vertex without intravenous contrast. RADIATION DOSE REDUCTION: This exam was performed according to the departmental dose-optimization program which includes automated exposure control, adjustment of the mA and/or kV according to patient size and/or use of iterative reconstruction technique. COMPARISON:  Brain MRI 12/28/2021.  Head CT 04/25/2005. FINDINGS: Brain: Stable cerebral volume from the MRI last year. No midline shift, mass effect, or evidence of intracranial mass lesion. Stable ventricle size and configuration. No acute intracranial hemorrhage identified. Patchy and confluent bilateral cerebral white matter hypodensity appears stable 2 MRI findings last year. No cortically based acute infarct identified. Vascular: No suspicious intracranial vascular hyperdensity. Calcified  atherosclerosis at the skull base. Skull: No skull fracture identified. Sinuses/Orbits: Chronic sphenoid sinusitis appears stable from the MRI last year, periosteal thickening associated. Left mastoid and left middle ear opacification are increased since that time. Right tympanic cavity, mastoids, and remaining paranasal sinuses are well aerated. Other: No discrete orbit or scalp soft tissue injury identified. IMPRESSION: 1.  No acute traumatic injury identified. 2. Chronic sphenoid sinusitis but increased left mastoid and middle ear opacification since last year. Consider otitis media with mastoid effusion in the appropriate clinical setting. 3. Chronic white matter disease.  No acute intracranial abnormality. Electronically Signed   By: Odessa Fleming M.D.   On: 01/14/2023 04:29    Assessment/Plan 1. 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, with long-term current use of insulin, macular edema presence unspecified (HCC) Change Insulin to 40 units Discontinue Januvia Continue to monitor his CBGS  2. Alzheimer's dementia with behavioral disturbance (HCC) Start on Depkote 125 mg Sprinkle at night On Aricept and Namenda Also on Vistaril pRn    Labs/tests ordered:  CBC,CMP in 6 weeks Next appt:  02/09/2023

## 2023-02-02 ENCOUNTER — Non-Acute Institutional Stay (SKILLED_NURSING_FACILITY): Payer: PPO | Admitting: Internal Medicine

## 2023-02-02 ENCOUNTER — Encounter: Payer: Self-pay | Admitting: Internal Medicine

## 2023-02-02 DIAGNOSIS — F02818 Dementia in other diseases classified elsewhere, unspecified severity, with other behavioral disturbance: Secondary | ICD-10-CM | POA: Diagnosis not present

## 2023-02-02 DIAGNOSIS — E113293 Type 2 diabetes mellitus with mild nonproliferative diabetic retinopathy without macular edema, bilateral: Secondary | ICD-10-CM

## 2023-02-02 DIAGNOSIS — Z794 Long term (current) use of insulin: Secondary | ICD-10-CM

## 2023-02-02 DIAGNOSIS — G309 Alzheimer's disease, unspecified: Secondary | ICD-10-CM | POA: Diagnosis not present

## 2023-02-02 NOTE — Progress Notes (Unsigned)
Location:  [facility] Room Number: 309A Place of Service:  SNF [phone]) Providersnf Mahlon Gammon, MD  Patient Care Team: Mahlon Gammon, MD as PCP - General (Internal Medicine) Kathleene Hazel, MD as PCP - Cardiology (Cardiology) Drema Dallas, DO as Consulting Physician (Neurology)  Extended Emergency Contact Information Primary Emergency Contact: [facility] Address: [address]          [place], [place] [address] [place] of [place] Home Phone: [phone] Mobile Phone: [phone] Relation: Daughter Secondary Emergency Contact: [facility] Phone: [phone] Relation: Spouse Preferred language: English Interpreter needed? No  Code Status:  DNR Goals of care: Advanced Directive information    02/02/2023    2:46 PM  Advanced Directives  Does Patient Have a Medical Advance Directive? Yes  Type of Advance Directive Out of facility DNR (pink MOST or yellow form)  Does patient want to make changes to medical advance directive? No - Patient declined     Chief Complaint  Patient presents with   Acute Visit    Patient is being seen for a acute visit     HPI:  Pt is a 87 y.o. male seen today for an acute visit for Behaviors   Recent admission to Memory unit with his Wife  It seems he is not sleeping well at night and then gets up late in the morning His wife went to his room in themorning and he got upset and was pushin g her away Seems like he also paces at nigth Was calm thsi morning Morning Sugars still low 85 this morning   Diabetes Type 2  Alzheimer Dementia MRI shows Advanced Atrophy Last MMSE 23/30 per Neurology  On Namenda and Exelon.   Urinary and Bowel Incontinence  CAD S/P Stent placement No Active symptoms HLD,Gait Instability  Peripheral Neuropathy, Anemia, OSA falied CPAP          Past Medical History:  Diagnosis Date   CAD (coronary artery disease)    Bare meta stent RCA    Carotid stenosis    a. Carotid US 3/17: bilateral ICA 1-39% >> repeat 2 years   Coronary atherosclerosis of native coronary artery 08/24/2008   S/P PCI   Dementia 12/15/2021   concerns for Alzheimer's disease   Essential hypertension, benign 08/24/2008   Fatigue 08/08/2010   History of nuclear stress test    Myoview 12/16: EF 66%, very mild inferior ischemia; Low Risk   Mixed hyperlipidemia 08/24/2008   Myocardial infarction 10/06/2007   Nonproliferative diabetic retinopathy 08/08/2011   Nuclear sclerosis of both eyes 12/01/2017   Peripheral neuropathy    Pulmonary nodule, right 09/13/2007   Right upper lobe         Sinoatrial node dysfunction 08/24/2008   Chronotropic incompetence   Snoring 09/27/2009   Type II diabetes mellitus 08/08/2011   Past Surgical History:  Procedure Laterality Date   LEFT HEART CATHETERIZATION WITH CORONARY ANGIOGRAM N/A 03/04/2012   Procedure: LEFT HEART CATHETERIZATION WITH CORONARY ANGIOGRAM;  Surgeon: Kathleene Hazel, MD;  Location: [facility] CATH LAB;  Service: Cardiovascular;  Laterality: N/A;   TONSILLECTOMY      Allergies  Allergen Reactions   Ace Inhibitors    Alpha Lipoic Acid [Lipoic Acid]     Upset Stomach   Invokana [Canagliflozin]    Heparin Hives   Isosorbide Mononitrate [Isosorbide Dinitrate Er] Hives   Penicillins Hives   Sulfonamide Derivatives Hives    Outpatient Encounter Medications as of 02/02/2023  Medication Sig  amLODipine (NORVASC) 10 MG tablet TAKE 1 TABLET ONCE DAILY.   aspirin 81 MG tablet Take 1 tablet (81 mg total) by mouth daily.   atorvastatin (LIPITOR) 10 MG tablet Take 10 mg by mouth daily.   citalopram (CELEXA) 20 MG tablet Take 20 mg by mouth daily.   Cyanocobalamin (VITAMIN B 12) 500 MCG TABS Take 2 tablets by mouth every other day.   divalproex (DEPAKOTE) 125 MG DR tablet Take 125 mg by mouth 3 (three) times daily.   famotidine (PEPCID) 20 MG tablet Take 20 mg by mouth 2 (two) times daily.   ferrous  sulfate 325 (65 FE) MG EC tablet Take 325 mg by mouth 3 (three) times a week.   hydrochlorothiazide (HYDRODIURIL) 25 MG tablet TAKE 1 TABLET ONCE DAILY.   hydrOXYzine (VISTARIL) 25 MG capsule Take 1 capsule (25 mg total) by mouth 2 (two) times daily as needed (increased agitation/ sundowning).   insulin aspart (NOVOLOG) 100 UNIT/ML injection Inject 5 Units into the skin 3 (three) times daily before meals. Only if CBGS more then 200   LANTUS SOLOSTAR 100 UNIT/ML injection Inject 40 Units into the skin every morning.   latanoprost (XALATAN) 0.005 % ophthalmic solution Place 1 drop into both eyes at bedtime.   memantine (NAMENDA) 10 MG tablet TAKE ONE TABLET BY MOUTH EVERY MORNING and TAKE ONE TABLET BY MOUTH EVERYDAY AT BEDTIME   metformin (FORTAMET) 500 MG (OSM) 24 hr tablet Take 500 mg by mouth 2 (two) times daily with a meal.   rivastigmine (EXELON) 1.5 MG capsule TAKE ONE CAPSULE BY MOUTH TWICE DAILY   spironolactone (ALDACTONE) 25 MG tablet Take 25 mg by mouth daily.   zolpidem (AMBIEN) 5 MG tablet Take 1 tablet (5 mg total) by mouth at bedtime. For sleep   No facility-administered encounter medications on file as of 02/02/2023.    Review of Systems  Unable to perform ROS: Dementia    Immunization History  Administered Date(s) Administered   Covid-19, Mrna,Vaccine(Spikevax)75yrs and older 05/07/2022   Fluad Quad(high Dose 65+) 04/08/2022   Influenza Split 05/01/2010, 03/11/2011, 04/06/2012, 04/21/2013, 05/03/2021   Influenza Whole 03/07/2009   Influenza, High Dose Seasonal PF 03/29/2014, 04/02/2015, 03/31/2016, 04/08/2017, 04/16/2018   Influenza-Unspecified 04/16/2019, 03/29/2020   Moderna Sars-Covid-2 Vaccination 07/19/2019, 08/17/2019, 06/01/2020, 12/09/2021   Pneumococcal Conjugate-13 03/29/2014   Pneumococcal Polysaccharide-23 10/31/2003, 09/07/2006, 03/25/2013   Td (Adult) 10/30/1993   Tdap 07/07/2010, 03/19/2011   Zoster, Live 10/30/2005, 10/21/2016, 12/24/2016   Zoster,  Unspecified 10/24/2016   Pertinent  Health Maintenance Due  Topic Date Due   INFLUENZA VACCINE  02/05/2023   HEMOGLOBIN A1C  02/17/2023   OPHTHALMOLOGY EXAM  07/30/2023   FOOT EXAM  10/07/2023      12/13/2021    1:30 PM 03/25/2022    2:29 PM 08/12/2022    2:44 PM 09/23/2022    1:07 PM 10/07/2022   10:32 AM  Fall Risk  Falls in the past year?  0 1 0 1  Was there an injury with Fall? 1 0 0 0 0  Fall Risk Category Calculator  0 2 0 1  Fall Risk Category (Retired)  Low     (RETIRED) Patient Fall Risk Level High fall risk Low fall risk     Patient at Risk for Falls Due to   History of fall(s);Impaired mobility;Impaired balance/gait  History of fall(s)  Fall risk Follow up    Falls evaluation completed Falls evaluation completed   Functional Status Survey:    Vitals:  02/02/23 1437  BP: 128/72  Pulse: 60  Resp: 16  Temp: (!) 97 F (36.1 C)  TempSrc: Temporal  SpO2: 99%  Weight: 177 lb 12.8 oz (80.6 kg)  Height: 6\' 2"  (1.88 m)   Body mass index is 22.83 kg/m. Physical Exam Vitals reviewed.  Constitutional:      Appearance: Normal appearance.  HENT:     Head: Normocephalic.     Nose: Nose normal.     Mouth/Throat:     Mouth: Mucous membranes are moist.     Pharynx: Oropharynx is clear.  Eyes:     Pupils: Pupils are equal, round, and reactive to light.  Cardiovascular:     Rate and Rhythm: Normal rate and regular rhythm.     Pulses: Normal pulses.     Heart sounds: No murmur heard. Pulmonary:     Effort: Pulmonary effort is normal. No respiratory distress.     Breath sounds: Normal breath sounds. No rales.  Abdominal:     General: Abdomen is flat. Bowel sounds are normal.     Palpations: Abdomen is soft.  Musculoskeletal:        General: Swelling present.     Cervical back: Neck supple.  Skin:    General: Skin is warm.  Neurological:     General: No focal deficit present.     Mental Status: He is alert.  Psychiatric:        Mood and Affect: Mood normal.         Thought Content: Thought content normal.     Labs reviewed: Recent Labs    08/19/22 0000 01/14/23 0410  NA 138 140  K 4.5 4.3  CL 101 107  CO2  --  23  GLUCOSE  --  93  BUN 27* 30*  CREATININE 1.3 1.61*  CALCIUM 9.1 9.1   Recent Labs    08/19/22 0000  AST 27  ALT 22  ALBUMIN 4.0   Recent Labs    08/19/22 0000 01/14/23 0410  WBC 6.0 8.5  NEUTROABS  --  6.7  HGB 12.5* 12.1*  HCT 37* 35.9*  MCV  --  94.7  PLT 261 217   Lab Results  Component Value Date   TSH 2.58 08/19/2022   Lab Results  Component Value Date   HGBA1C 7.7 08/19/2022   Lab Results  Component Value Date   CHOL 136 08/19/2022   HDL 37 08/19/2022   LDLCALC 80 08/19/2022   TRIG 96 08/19/2022   CHOLHDL 3.0 09/04/2007    Significant Diagnostic Results in last 30 days:  CT Thoracic Spine Wo Contrast  Result Date: 01/14/2023 CLINICAL DATA:  87 year old male status post fall striking head. EXAM: CT THORACIC SPINE WITHOUT CONTRAST TECHNIQUE: Multidetector CT images of the thoracic were obtained using the standard protocol without intravenous contrast. RADIATION DOSE REDUCTION: This exam was performed according to the departmental dose-optimization program which includes automated exposure control, adjustment of the mA and/or kV according to patient size and/or use of iterative reconstruction technique. COMPARISON:  Cervical spine CT today reported separately. Previous chest CT 09/07/2009. FINDINGS: Limited cervical spine imaging: Reported separately today. Thoracic spine segmentation:  Normal. Alignment: Mildly exaggerated upper thoracic kyphosis, and mild midthoracic dextroconvex scoliosis, have not significantly changed from 2011. Vertebrae: Stable thoracic vertebral height since 2011. No thoracic vertebral fracture identified. Visible upper lumbar levels also appear grossly intact. Visible posterior ribs also appear intact. No acute osseous abnormality identified. Paraspinal and other soft tissues:  Calcified coronary artery atherosclerosis  and/or stents. Calcified aortic atherosclerosis. No pericardial or pleural effusion. No evidence of mediastinal mass or lymphadenopathy. Punctate cholelithiasis series 3, image 162. Mild renal vascular calcifications. Lower lung volumes with symmetric mild atelectasis and respiratory motion artifact. Thoracic paraspinal soft tissues are within normal limits. Disc levels: Chronic thoracic spine degeneration, especially thoracic disc and endplate degeneration from T6-T7 (vacuum disc) through T9-T10 which has progressed since 2011. However, mild if any associated thoracic spinal stenosis. IMPRESSION: 1. No acute traumatic injury identified in the Thoracic Spine. 2. Chronic thoracic spine degeneration with progression since 2011. Electronically Signed   By: Odessa Fleming M.D.   On: 01/14/2023 04:38   CT Cervical Spine Wo Contrast  Result Date: 01/14/2023 CLINICAL DATA:  87 year old male status post fall striking head. EXAM: CT CERVICAL SPINE WITHOUT CONTRAST TECHNIQUE: Multidetector CT imaging of the cervical spine was performed without intravenous contrast. Multiplanar CT image reconstructions were also generated. RADIATION DOSE REDUCTION: This exam was performed according to the departmental dose-optimization program which includes automated exposure control, adjustment of the mA and/or kV according to patient size and/or use of iterative reconstruction technique. COMPARISON:  Head CT today.  Brain MRI 12/28/2021. FINDINGS: Alignment: Phys ir cervical lordosis. Mild degenerative appearing anterolisthesis of C7 on T1, with associated facet arthropathy. Skull base and vertebrae: Bone mineralization is within normal limits for age. Visualized skull base is intact. No atlanto-occipital dissociation. C1 and C2 appear intact and aligned. No acute osseous abnormality identified. Soft tissues and spinal canal: No prevertebral fluid or swelling. No visible canal hematoma. Negative  visible noncontrast neck soft tissues except for calcified carotid atherosclerosis. Disc levels: Widespread advanced cervical disc and endplate degeneration. Mild multilevel cervical spinal stenosis suspected, perhaps most pronounced at C6-C7. Upper chest: Grossly intact visible upper thoracic levels with similar disc and endplate degeneration. Mild apical lung scarring. Thoracic spine is detailed separately. IMPRESSION: 1. No acute traumatic injury identified in the cervical spine. 2. Advanced cervical spine degeneration with multilevel degenerative spinal stenosis suspected. Electronically Signed   By: Odessa Fleming M.D.   On: 01/14/2023 04:32   CT Head Wo Contrast  Result Date: 01/14/2023 CLINICAL DATA:  87 year old male status post fall striking head. EXAM: CT HEAD WITHOUT CONTRAST TECHNIQUE: Contiguous axial images were obtained from the base of the skull through the vertex without intravenous contrast. RADIATION DOSE REDUCTION: This exam was performed according to the departmental dose-optimization program which includes automated exposure control, adjustment of the mA and/or kV according to patient size and/or use of iterative reconstruction technique. COMPARISON:  Brain MRI 12/28/2021.  Head CT 04/25/2005. FINDINGS: Brain: Stable cerebral volume from the MRI last year. No midline shift, mass effect, or evidence of intracranial mass lesion. Stable ventricle size and configuration. No acute intracranial hemorrhage identified. Patchy and confluent bilateral cerebral white matter hypodensity appears stable 2 MRI findings last year. No cortically based acute infarct identified. Vascular: No suspicious intracranial vascular hyperdensity. Calcified atherosclerosis at the skull base. Skull: No skull fracture identified. Sinuses/Orbits: Chronic sphenoid sinusitis appears stable from the MRI last year, periosteal thickening associated. Left mastoid and left middle ear opacification are increased since that time. Right  tympanic cavity, mastoids, and remaining paranasal sinuses are well aerated. Other: No discrete orbit or scalp soft tissue injury identified. IMPRESSION: 1.  No acute traumatic injury identified. 2. Chronic sphenoid sinusitis but increased left mastoid and middle ear opacification since last year. Consider otitis media with mastoid effusion in the appropriate clinical setting. 3. Chronic white matter disease.  No  acute intracranial abnormality. Electronically Signed   By: Odessa Fleming M.D.   On: 01/14/2023 04:29    Assessment/Plan 1. Alzheimer's dementia with behavioral disturbance (HCC) Start on Depakote 250 mg at bedtime On vistaril PRN Also on Exelon and Namenda  2. 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, with long-term current use of insulin, macular edema presence unspecified (HCC) Change Insulin to 38 units Novolog coverage with 5 units with lunch if CBG more then 200  Essential hypertension, benign On Norvasc and hydrochlorothiazide      Coronary artery disease involving native coronary artery of native heart without angina pectoris On aspirin and Statin   Hyperlipidemia, unspecified hyperlipidemia type Statin LDL 80 02/24   Gait instability Walker CKD Repeat BMP pending Depression On Celexa  LE edema On hydrochlorothiazide and Aldactone Not sure was compliant at home Will watch here Anemia HGB stable on iron    Family/ staff Communication:   Labs/tests ordered:  A1C and BMp tomorrow

## 2023-02-09 ENCOUNTER — Encounter: Payer: PPO | Admitting: Adult Health

## 2023-02-09 ENCOUNTER — Non-Acute Institutional Stay (SKILLED_NURSING_FACILITY): Payer: PPO | Admitting: Internal Medicine

## 2023-02-09 ENCOUNTER — Encounter: Payer: Self-pay | Admitting: Internal Medicine

## 2023-02-09 DIAGNOSIS — I10 Essential (primary) hypertension: Secondary | ICD-10-CM

## 2023-02-09 DIAGNOSIS — G309 Alzheimer's disease, unspecified: Secondary | ICD-10-CM

## 2023-02-09 DIAGNOSIS — F02818 Dementia in other diseases classified elsewhere, unspecified severity, with other behavioral disturbance: Secondary | ICD-10-CM

## 2023-02-09 DIAGNOSIS — F5101 Primary insomnia: Secondary | ICD-10-CM | POA: Diagnosis not present

## 2023-02-09 DIAGNOSIS — Z794 Long term (current) use of insulin: Secondary | ICD-10-CM

## 2023-02-09 DIAGNOSIS — E113293 Type 2 diabetes mellitus with mild nonproliferative diabetic retinopathy without macular edema, bilateral: Secondary | ICD-10-CM

## 2023-02-09 NOTE — Progress Notes (Unsigned)
Location:  [facility] Room Number: NO/309/A Place of Service:  SNF [phone]) Provider:  Mahlon Gammon, MD  Patient Care Team: Mahlon Gammon, MD as PCP - General (Internal Medicine) Kathleene Hazel, MD as PCP - Cardiology (Cardiology) Drema Dallas, DO as Consulting Physician (Neurology)  Extended Emergency Contact Information Primary Emergency Contact: [facility] Address: [address]          [place], [place] [address] [place] Home Phone: [phone] Mobile Phone: [phone] Relation: Daughter Secondary Emergency Contact: [facility] Phone: [phone] Relation: Spouse Preferred language: English Interpreter needed? No  Code Status:  DNR Goals of care: Advanced Directive information    02/02/2023    2:46 PM  Advanced Directives  Does Patient Have a Medical Advance Directive? Yes  Type of Advance Directive Out of facility DNR (pink MOST or yellow form)  Does patient want to make changes to medical advance directive? No - Patient declined     Chief Complaint  Patient presents with   Acute Visit   Quality Metric Gaps    Patient is due for AWV, updated vaccines, and Urine ACR, discuss need for shingrix vaccine    HPI:  Pt is a 87 y.o. male seen today for an acute visit for behaviors  Recent admission to Memory unit with his Wife   Stays up at night Romans around in the unit Looks for his wife Walks with his walker Continues to be fall risk Behaviors are better Not that agitated anymore  CBGS in the morning are now better No Hypoglycemia But they are going up at night to 300  Other  history  Diabetes Type 2  Alzheimer Dementia MRI shows Advanced Atrophy Last MMSE 23/30 per Neurology  On Namenda and Exelon.   Urinary and Bowel Incontinence  CAD S/P Stent placement No Active symptoms HLD,Gait Instability  Peripheral Neuropathy, Anemia, OSA falied CPAP Past Medical History:   Diagnosis Date   CAD (coronary artery disease)    Bare meta stent RCA   Carotid stenosis    a. Carotid US 3/17: bilateral ICA 1-39% >> repeat 2 years   Coronary atherosclerosis of native coronary artery 08/24/2008   S/P PCI   Dementia 12/15/2021   concerns for Alzheimer's disease   Essential hypertension, benign 08/24/2008   Fatigue 08/08/2010   History of nuclear stress test    Myoview 12/16: EF 66%, very mild inferior ischemia; Low Risk   Mixed hyperlipidemia 08/24/2008   Myocardial infarction 10/06/2007   Nonproliferative diabetic retinopathy 08/08/2011   Nuclear sclerosis of both eyes 12/01/2017   Peripheral neuropathy    Pulmonary nodule, right 09/13/2007   Right upper lobe         Sinoatrial node dysfunction 08/24/2008   Chronotropic incompetence   Snoring 09/27/2009   Type II diabetes mellitus 08/08/2011   Past Surgical History:  Procedure Laterality Date   LEFT HEART CATHETERIZATION WITH CORONARY ANGIOGRAM N/A 03/04/2012   Procedure: LEFT HEART CATHETERIZATION WITH CORONARY ANGIOGRAM;  Surgeon: Kathleene Hazel, MD;  Location: [facility] CATH LAB;  Service: Cardiovascular;  Laterality: N/A;   TONSILLECTOMY      Allergies  Allergen Reactions   Ace Inhibitors    Alpha Lipoic Acid [Lipoic Acid]     Upset Stomach   Invokana [Canagliflozin]    Heparin Hives   Isosorbide Mononitrate [Isosorbide Dinitrate Er] Hives   Penicillins Hives   Sulfonamide Derivatives Hives    Outpatient Encounter Medications as of 02/09/2023  Medication Sig  amLODipine (NORVASC) 10 MG tablet TAKE 1 TABLET ONCE DAILY.   aspirin 81 MG tablet Take 1 tablet (81 mg total) by mouth daily.   atorvastatin (LIPITOR) 10 MG tablet Take 10 mg by mouth daily.   citalopram (CELEXA) 20 MG tablet Take 20 mg by mouth daily.   Cyanocobalamin (VITAMIN B 12) 500 MCG TABS Take 2 tablets by mouth every other day.   divalproex (DEPAKOTE ER) 250 MG 24 hr tablet Take 250 mg by mouth at bedtime.   famotidine  (PEPCID) 20 MG tablet Take 20 mg by mouth 2 (two) times daily.   ferrous sulfate 325 (65 FE) MG EC tablet Take 325 mg by mouth 3 (three) times a week.   hydrochlorothiazide (HYDRODIURIL) 25 MG tablet TAKE 1 TABLET ONCE DAILY.   hydrOXYzine (VISTARIL) 25 MG capsule Take 1 capsule (25 mg total) by mouth 2 (two) times daily as needed (increased agitation/ sundowning).   insulin aspart (NOVOLOG) 100 UNIT/ML injection Inject 5 Units into the skin once. With lunch Only if CBGS more then 200   LANTUS SOLOSTAR 100 UNIT/ML injection Inject 38 Units into the skin every morning.   latanoprost (XALATAN) 0.005 % ophthalmic solution Place 1 drop into both eyes at bedtime.   memantine (NAMENDA) 10 MG tablet TAKE ONE TABLET BY MOUTH EVERY MORNING and TAKE ONE TABLET BY MOUTH EVERYDAY AT BEDTIME   metformin (FORTAMET) 500 MG (OSM) 24 hr tablet Take 500 mg by mouth 2 (two) times daily with a meal.   rivastigmine (EXELON) 1.5 MG capsule TAKE ONE CAPSULE BY MOUTH TWICE DAILY   spironolactone (ALDACTONE) 25 MG tablet Take 25 mg by mouth daily.   zolpidem (AMBIEN) 5 MG tablet Take 1 tablet (5 mg total) by mouth at bedtime. For sleep   No facility-administered encounter medications on file as of 02/09/2023.    Review of Systems  Unable to perform ROS: Dementia    Immunization History  Administered Date(s) Administered   Covid-19, Mrna,Vaccine(Spikevax)61yrs and older 05/07/2022   Fluad Quad(high Dose 65+) 04/08/2022   Influenza Split 05/01/2010, 03/11/2011, 04/06/2012, 04/21/2013, 05/03/2021   Influenza Whole 03/07/2009   Influenza, High Dose Seasonal PF 03/29/2014, 04/02/2015, 03/31/2016, 04/08/2017, 04/16/2018   Influenza-Unspecified 04/16/2019, 03/29/2020   Moderna Sars-Covid-2 Vaccination 07/19/2019, 08/17/2019, 06/01/2020, 12/09/2021   Pneumococcal Conjugate-13 03/29/2014   Pneumococcal Polysaccharide-23 10/31/2003, 09/07/2006, 03/25/2013   Td (Adult) 10/30/1993   Tdap 07/07/2010, 03/19/2011   Zoster,  Live 10/30/2005, 10/21/2016, 12/24/2016   Zoster, Unspecified 10/24/2016   Pertinent  Health Maintenance Due  Topic Date Due   INFLUENZA VACCINE  02/05/2023   HEMOGLOBIN A1C  02/17/2023   OPHTHALMOLOGY EXAM  07/30/2023   FOOT EXAM  10/07/2023      12/13/2021    1:30 PM 03/25/2022    2:29 PM 08/12/2022    2:44 PM 09/23/2022    1:07 PM 10/07/2022   10:32 AM  Fall Risk  Falls in the past year?  0 1 0 1  Was there an injury with Fall? 1 0 0 0 0  Fall Risk Category Calculator  0 2 0 1  Fall Risk Category (Retired)  Low     (RETIRED) Patient Fall Risk Level High fall risk Low fall risk     Patient at Risk for Falls Due to   History of fall(s);Impaired mobility;Impaired balance/gait  History of fall(s)  Fall risk Follow up    Falls evaluation completed Falls evaluation completed   Functional Status Survey:    Vitals:   02/09/23 1048  02/09/23 1049  BP: (!) 151/81 (!) 151/81  Pulse:  (!) 55  Resp:  20  Temp:  (!) 96.3 F (35.7 C)  SpO2:  99%  Weight:  176 lb (79.8 kg)  Height:  6\' 2"  (1.88 m)   Body mass index is 22.6 kg/m. Physical Exam Vitals reviewed.  Constitutional:      Appearance: Normal appearance.  HENT:     Head: Normocephalic.     Nose: Nose normal.     Mouth/Throat:     Mouth: Mucous membranes are moist.     Pharynx: Oropharynx is clear.  Eyes:     Pupils: Pupils are equal, round, and reactive to light.  Cardiovascular:     Rate and Rhythm: Normal rate and regular rhythm.     Pulses: Normal pulses.     Heart sounds: No murmur heard. Pulmonary:     Effort: Pulmonary effort is normal. No respiratory distress.     Breath sounds: Normal breath sounds. No rales.  Abdominal:     General: Abdomen is flat. Bowel sounds are normal.     Palpations: Abdomen is soft.  Musculoskeletal:        General: No swelling.     Cervical back: Neck supple.  Skin:    General: Skin is warm.  Neurological:     General: No focal deficit present.     Mental Status: He is alert.      Comments: Walks with his walker  Psychiatric:        Mood and Affect: Mood normal.        Thought Content: Thought content normal.     Labs reviewed: Recent Labs    08/19/22 0000 01/14/23 0410  NA 138 140  K 4.5 4.3  CL 101 107  CO2  --  23  GLUCOSE  --  93  BUN 27* 30*  CREATININE 1.3 1.61*  CALCIUM 9.1 9.1   Recent Labs    08/19/22 0000  AST 27  ALT 22  ALBUMIN 4.0   Recent Labs    08/19/22 0000 01/14/23 0410  WBC 6.0 8.5  NEUTROABS  --  6.7  HGB 12.5* 12.1*  HCT 37* 35.9*  MCV  --  94.7  PLT 261 217   Lab Results  Component Value Date   TSH 2.58 08/19/2022   Lab Results  Component Value Date   HGBA1C 7.7 08/19/2022   Lab Results  Component Value Date   CHOL 136 08/19/2022   HDL 37 08/19/2022   LDLCALC 80 08/19/2022   TRIG 96 08/19/2022   CHOLHDL 3.0 09/04/2007    Significant Diagnostic Results in last 30 days:  CT Thoracic Spine Wo Contrast  Result Date: 01/14/2023 CLINICAL DATA:  87 year old male status post fall striking head. EXAM: CT THORACIC SPINE WITHOUT CONTRAST TECHNIQUE: Multidetector CT images of the thoracic were obtained using the standard protocol without intravenous contrast. RADIATION DOSE REDUCTION: This exam was performed according to the departmental dose-optimization program which includes automated exposure control, adjustment of the mA and/or kV according to patient size and/or use of iterative reconstruction technique. COMPARISON:  Cervical spine CT today reported separately. Previous chest CT 09/07/2009. FINDINGS: Limited cervical spine imaging: Reported separately today. Thoracic spine segmentation:  Normal. Alignment: Mildly exaggerated upper thoracic kyphosis, and mild midthoracic dextroconvex scoliosis, have not significantly changed from 2011. Vertebrae: Stable thoracic vertebral height since 2011. No thoracic vertebral fracture identified. Visible upper lumbar levels also appear grossly intact. Visible posterior ribs also  appear intact.  No acute osseous abnormality identified. Paraspinal and other soft tissues: Calcified coronary artery atherosclerosis and/or stents. Calcified aortic atherosclerosis. No pericardial or pleural effusion. No evidence of mediastinal mass or lymphadenopathy. Punctate cholelithiasis series 3, image 162. Mild renal vascular calcifications. Lower lung volumes with symmetric mild atelectasis and respiratory motion artifact. Thoracic paraspinal soft tissues are within normal limits. Disc levels: Chronic thoracic spine degeneration, especially thoracic disc and endplate degeneration from T6-T7 (vacuum disc) through T9-T10 which has progressed since 2011. However, mild if any associated thoracic spinal stenosis. IMPRESSION: 1. No acute traumatic injury identified in the Thoracic Spine. 2. Chronic thoracic spine degeneration with progression since 2011. Electronically Signed   By: Odessa Fleming M.D.   On: 01/14/2023 04:38   CT Cervical Spine Wo Contrast  Result Date: 01/14/2023 CLINICAL DATA:  87 year old male status post fall striking head. EXAM: CT CERVICAL SPINE WITHOUT CONTRAST TECHNIQUE: Multidetector CT imaging of the cervical spine was performed without intravenous contrast. Multiplanar CT image reconstructions were also generated. RADIATION DOSE REDUCTION: This exam was performed according to the departmental dose-optimization program which includes automated exposure control, adjustment of the mA and/or kV according to patient size and/or use of iterative reconstruction technique. COMPARISON:  Head CT today.  Brain MRI 12/28/2021. FINDINGS: Alignment: Phys ir cervical lordosis. Mild degenerative appearing anterolisthesis of C7 on T1, with associated facet arthropathy. Skull base and vertebrae: Bone mineralization is within normal limits for age. Visualized skull base is intact. No atlanto-occipital dissociation. C1 and C2 appear intact and aligned. No acute osseous abnormality identified. Soft tissues and  spinal canal: No prevertebral fluid or swelling. No visible canal hematoma. Negative visible noncontrast neck soft tissues except for calcified carotid atherosclerosis. Disc levels: Widespread advanced cervical disc and endplate degeneration. Mild multilevel cervical spinal stenosis suspected, perhaps most pronounced at C6-C7. Upper chest: Grossly intact visible upper thoracic levels with similar disc and endplate degeneration. Mild apical lung scarring. Thoracic spine is detailed separately. IMPRESSION: 1. No acute traumatic injury identified in the cervical spine. 2. Advanced cervical spine degeneration with multilevel degenerative spinal stenosis suspected. Electronically Signed   By: Odessa Fleming M.D.   On: 01/14/2023 04:32   CT Head Wo Contrast  Result Date: 01/14/2023 CLINICAL DATA:  87 year old male status post fall striking head. EXAM: CT HEAD WITHOUT CONTRAST TECHNIQUE: Contiguous axial images were obtained from the base of the skull through the vertex without intravenous contrast. RADIATION DOSE REDUCTION: This exam was performed according to the departmental dose-optimization program which includes automated exposure control, adjustment of the mA and/or kV according to patient size and/or use of iterative reconstruction technique. COMPARISON:  Brain MRI 12/28/2021.  Head CT 04/25/2005. FINDINGS: Brain: Stable cerebral volume from the MRI last year. No midline shift, mass effect, or evidence of intracranial mass lesion. Stable ventricle size and configuration. No acute intracranial hemorrhage identified. Patchy and confluent bilateral cerebral white matter hypodensity appears stable 2 MRI findings last year. No cortically based acute infarct identified. Vascular: No suspicious intracranial vascular hyperdensity. Calcified atherosclerosis at the skull base. Skull: No skull fracture identified. Sinuses/Orbits: Chronic sphenoid sinusitis appears stable from the MRI last year, periosteal thickening associated.  Left mastoid and left middle ear opacification are increased since that time. Right tympanic cavity, mastoids, and remaining paranasal sinuses are well aerated. Other: No discrete orbit or scalp soft tissue injury identified. IMPRESSION: 1.  No acute traumatic injury identified. 2. Chronic sphenoid sinusitis but increased left mastoid and middle ear opacification since last year. Consider otitis media with  mastoid effusion in the appropriate clinical setting. 3. Chronic white matter disease.  No acute intracranial abnormality. Electronically Signed   By: Odessa Fleming M.D.   On: 01/14/2023 04:29    Assessment/Plan 1. Primary insomnia Discontinue Ambien Start on Remeron 7.5 mg QHS  2. Alzheimer's dementia with behavioral disturbance (HCC) On Depakote Also schedule vistaril 25 mg at night   3. 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, with long-term current use of insulin, macular edema presence unspecified (HCC) Novolog Insulin 8units with Lunch and Dinner Morning CBGs are good now 90-120  Essential hypertension, benign On Norvasc and hydrochlorothiazide      Coronary artery disease involving native coronary artery of native heart without angina pectoris On aspirin and Statin   Hyperlipidemia, unspecified hyperlipidemia type Statin LDL 80 02/24   Gait instability Walker CKD Repeat BMP pending Depression On Celexa  LE edema On hydrochlorothiazide and Aldactone Not sure was compliant at home Will watch here Anemia HGB stable on iron    Family/ staff Communication:   Labs/tests ordered:  CBC,CMP,A1C pending from 09/03

## 2023-02-23 ENCOUNTER — Non-Acute Institutional Stay (SKILLED_NURSING_FACILITY): Payer: PPO | Admitting: Internal Medicine

## 2023-02-23 ENCOUNTER — Encounter: Payer: Self-pay | Admitting: Internal Medicine

## 2023-02-23 DIAGNOSIS — F5101 Primary insomnia: Secondary | ICD-10-CM

## 2023-02-23 DIAGNOSIS — F02818 Dementia in other diseases classified elsewhere, unspecified severity, with other behavioral disturbance: Secondary | ICD-10-CM | POA: Diagnosis not present

## 2023-02-23 DIAGNOSIS — G309 Alzheimer's disease, unspecified: Secondary | ICD-10-CM | POA: Diagnosis not present

## 2023-02-23 DIAGNOSIS — E113293 Type 2 diabetes mellitus with mild nonproliferative diabetic retinopathy without macular edema, bilateral: Secondary | ICD-10-CM | POA: Diagnosis not present

## 2023-02-23 DIAGNOSIS — Z794 Long term (current) use of insulin: Secondary | ICD-10-CM

## 2023-02-23 NOTE — Progress Notes (Unsigned)
Location:  [facility] Room Number: 309A Place of Service:  SNF [phone]) Provider:  Mahlon Gammon, MD   Mahlon Gammon, MD  Patient Care Team: Mahlon Gammon, MD as PCP - General (Internal Medicine) Kathleene Hazel, MD as PCP - Cardiology (Cardiology) Drema Dallas, DO as Consulting Physician (Neurology)  Extended Emergency Contact Information Primary Emergency Contact: [facility] Address: [address]          [place], [place] [address] Darden [place] Home Phone: [phone] Mobile Phone: [phone] Relation: Daughter Secondary Emergency Contact: [facility] Phone: [phone] Relation: Spouse Preferred language: English Interpreter needed? No  Code Status:  DNR  Goals of care: Advanced Directive information    02/23/2023    9:40 AM  Advanced Directives  Does Patient Have a Medical Advance Directive? Yes  Type of Advance Directive Out of facility DNR (pink MOST or yellow form)  Does patient want to make changes to medical advance directive? No - Patient declined     Chief Complaint  Patient presents with   Acute Visit    Patient is being seen for some sleep issues    HPI:  Pt is a 87 y.o. male seen today for an acute visit for Insomnia and Behaviors at night Recent admission to Memory unit with his Wife  in [place]  He continues to stay up at night and gets upset Looks for his wife who sleeps in other room Takes naps during daytime Does not get agitated but did have exit seeking few days ago  CBGS are better in the morning but elevated in the afternoon and evening Sometimes more then 300 He was napping the whole day today Pleasantly confused  Other  history   Diabetes Type 2  Alzheimer Dementia MRI shows Advanced Atrophy Last MMSE 23/30 per Neurology  On Namenda and Exelon.   Urinary and Bowel Incontinence  CAD S/P Stent placement No Active symptoms HLD,Gait Instability  Peripheral  Neuropathy, Anemia, OSA falied CPAP  Past Medical History:  Diagnosis Date   CAD (coronary artery disease)    Bare meta stent RCA   Carotid stenosis    a. Carotid US 3/17: bilateral ICA 1-39% >> repeat 2 years   Coronary atherosclerosis of native coronary artery 08/24/2008   S/P PCI   Dementia 12/15/2021   concerns for Alzheimer's disease   Essential hypertension, benign 08/24/2008   Fatigue 08/08/2010   History of nuclear stress test    Myoview 12/16: EF 66%, very mild inferior ischemia; Low Risk   Mixed hyperlipidemia 08/24/2008   Myocardial infarction 10/06/2007   Nonproliferative diabetic retinopathy 08/08/2011   Nuclear sclerosis of both eyes 12/01/2017   Peripheral neuropathy    Pulmonary nodule, right 09/13/2007   Right upper lobe         Sinoatrial node dysfunction 08/24/2008   Chronotropic incompetence   Snoring 09/27/2009   Type II diabetes mellitus 08/08/2011   Past Surgical History:  Procedure Laterality Date   LEFT HEART CATHETERIZATION WITH CORONARY ANGIOGRAM N/A 03/04/2012   Procedure: LEFT HEART CATHETERIZATION WITH CORONARY ANGIOGRAM;  Surgeon: Kathleene Hazel, MD;  Location: [facility] CATH LAB;  Service: Cardiovascular;  Laterality: N/A;   TONSILLECTOMY      Allergies  Allergen Reactions   Ace Inhibitors    Alpha Lipoic Acid [Lipoic Acid]     Upset Stomach   Invokana [Canagliflozin]    Heparin Hives   Isosorbide Mononitrate [Isosorbide Dinitrate Er] Hives   Penicillins Hives  Sulfonamide Derivatives Hives    Outpatient Encounter Medications as of 02/23/2023  Medication Sig   amLODipine (NORVASC) 10 MG tablet TAKE 1 TABLET ONCE DAILY.   aspirin 81 MG tablet Take 1 tablet (81 mg total) by mouth daily.   atorvastatin (LIPITOR) 10 MG tablet Take 10 mg by mouth daily.   citalopram (CELEXA) 20 MG tablet Take 20 mg by mouth daily.   Cyanocobalamin (VITAMIN B 12) 500 MCG TABS Take 2 tablets by mouth every other day.   divalproex (DEPAKOTE ER) 250 MG 24  hr tablet Take 250 mg by mouth at bedtime.   famotidine (PEPCID) 20 MG tablet Take 20 mg by mouth 2 (two) times daily.   ferrous sulfate 325 (65 FE) MG EC tablet Take 325 mg by mouth 3 (three) times a week.   hydrochlorothiazide (HYDRODIURIL) 25 MG tablet TAKE 1 TABLET ONCE DAILY.   hydrOXYzine (VISTARIL) 25 MG capsule Take 1 capsule (25 mg total) by mouth 2 (two) times daily as needed (increased agitation/ sundowning).   insulin aspart (NOVOLOG) 100 UNIT/ML injection Inject 8 Units into the skin once. With lunch and Dinner Only if CBGS more then 200   LANTUS SOLOSTAR 100 UNIT/ML injection Inject 38 Units into the skin every morning.   latanoprost (XALATAN) 0.005 % ophthalmic solution Place 1 drop into both eyes at bedtime.   memantine (NAMENDA) 10 MG tablet TAKE ONE TABLET BY MOUTH EVERY MORNING and TAKE ONE TABLET BY MOUTH EVERYDAY AT BEDTIME   metformin (FORTAMET) 500 MG (OSM) 24 hr tablet Take 500 mg by mouth 2 (two) times daily with a meal.   mirtazapine (REMERON) 15 MG tablet Take 15 mg by mouth at bedtime. Take 7.5mg  oral at bedtime   rivastigmine (EXELON) 1.5 MG capsule TAKE ONE CAPSULE BY MOUTH TWICE DAILY   spironolactone (ALDACTONE) 25 MG tablet Take 25 mg by mouth daily.   No facility-administered encounter medications on file as of 02/23/2023.    Review of Systems  Unable to perform ROS: Dementia    Immunization History  Administered Date(s) Administered   Covid-19, Mrna,Vaccine(Spikevax)11yrs and older 05/07/2022   Fluad Quad(high Dose 65+) 04/08/2022   Influenza Split 05/01/2010, 03/11/2011, 04/06/2012, 04/21/2013, 05/03/2021   Influenza Whole 03/07/2009   Influenza, High Dose Seasonal PF 03/29/2014, 04/02/2015, 03/31/2016, 04/08/2017, 04/16/2018   Influenza-Unspecified 04/16/2019, 03/29/2020   Moderna Sars-Covid-2 Vaccination 07/19/2019, 08/17/2019, 06/01/2020, 12/09/2021   Pneumococcal Conjugate-13 03/29/2014   Pneumococcal Polysaccharide-23 10/31/2003, 09/07/2006,  03/25/2013   Td (Adult) 10/30/1993   Tdap 07/07/2010, 03/19/2011   Zoster, Live 10/30/2005, 10/21/2016, 12/24/2016   Zoster, Unspecified 10/24/2016   Pertinent  Health Maintenance Due  Topic Date Due   INFLUENZA VACCINE  02/05/2023   HEMOGLOBIN A1C  02/17/2023   OPHTHALMOLOGY EXAM  07/30/2023   FOOT EXAM  10/07/2023      12/13/2021    1:30 PM 03/25/2022    2:29 PM 08/12/2022    2:44 PM 09/23/2022    1:07 PM 10/07/2022   10:32 AM  Fall Risk  Falls in the past year?  0 1 0 1  Was there an injury with Fall? 1 0 0 0 0  Fall Risk Category Calculator  0 2 0 1  Fall Risk Category (Retired)  Low     (RETIRED) Patient Fall Risk Level High fall risk Low fall risk     Patient at Risk for Falls Due to   History of fall(s);Impaired mobility;Impaired balance/gait  History of fall(s)  Fall risk Follow up  Falls evaluation completed Falls evaluation completed   Functional Status Survey:    Vitals:   02/23/23 0934  BP: (!) 154/84  Pulse: 60  Resp: 18  Temp: 97.6 F (36.4 C)  TempSrc: Temporal  SpO2: 100%  Weight: 176 lb (79.8 kg)  Height: 6\' 2"  (1.88 m)   Body mass index is 22.6 kg/m. Physical Exam Vitals reviewed.  Constitutional:      Appearance: Normal appearance.  HENT:     Head: Normocephalic.     Nose: Nose normal.     Mouth/Throat:     Mouth: Mucous membranes are moist.     Pharynx: Oropharynx is clear.  Eyes:     Pupils: Pupils are equal, round, and reactive to light.  Cardiovascular:     Rate and Rhythm: Normal rate. Rhythm irregular.     Pulses: Normal pulses.     Heart sounds: No murmur heard. Pulmonary:     Effort: Pulmonary effort is normal. No respiratory distress.     Breath sounds: Normal breath sounds. No rales.  Abdominal:     General: Abdomen is flat. Bowel sounds are normal.     Palpations: Abdomen is soft.  Musculoskeletal:        General: No swelling.     Cervical back: Neck supple.  Skin:    General: Skin is warm.  Neurological:     General:  No focal deficit present.     Mental Status: He is alert.  Psychiatric:        Mood and Affect: Mood normal.        Thought Content: Thought content normal.     Labs reviewed: Recent Labs    08/19/22 0000 01/14/23 0410  NA 138 140  K 4.5 4.3  CL 101 107  CO2  --  23  GLUCOSE  --  93  BUN 27* 30*  CREATININE 1.3 1.61*  CALCIUM 9.1 9.1   Recent Labs    08/19/22 0000  AST 27  ALT 22  ALBUMIN 4.0   Recent Labs    08/19/22 0000 01/14/23 0410  WBC 6.0 8.5  NEUTROABS  --  6.7  HGB 12.5* 12.1*  HCT 37* 35.9*  MCV  --  94.7  PLT 261 217   Lab Results  Component Value Date   TSH 2.58 08/19/2022   Lab Results  Component Value Date   HGBA1C 7.7 08/19/2022   Lab Results  Component Value Date   CHOL 136 08/19/2022   HDL 37 08/19/2022   LDLCALC 80 08/19/2022   TRIG 96 08/19/2022   CHOLHDL 3.0 09/04/2007    Significant Diagnostic Results in last 30 days:  No results found.  Assessment/Plan 1. Alzheimer's dementia with behavioral disturbance (HCC) Continues to have issues at night Will Change his Depakote to 500 mg At night Repeat Labs tomorrow  2. Primary insomnia Change Remeron to 15 mg Continue Vistaril Taken off Ambien  3. 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, with long-term current use of insulin, macular edema presence unspecified (HCC) Change Novolog bolus with Meals to 10 units Continue Lantus    Family/ staff Communication:   Labs/tests ordered:  CBC,CMP,A1C in AM

## 2023-02-24 LAB — HEPATIC FUNCTION PANEL
ALT: 25 U/L (ref 10–40)
AST: 32 (ref 14–40)
Alkaline Phosphatase: 104 (ref 25–125)
Bilirubin, Total: 0.7

## 2023-02-24 LAB — CBC AND DIFFERENTIAL
HCT: 36 — AB (ref 41–53)
Hemoglobin: 11.9 — AB (ref 13.5–17.5)
Platelets: 294 10*3/uL (ref 150–400)
WBC: 5.7

## 2023-02-24 LAB — BASIC METABOLIC PANEL WITH GFR
BUN: 33 — AB (ref 4–21)
CO2: 24 — AB (ref 13–22)
Chloride: 106 (ref 99–108)
Creatinine: 1.4 — AB (ref 0.6–1.3)
Glucose: 65
Potassium: 4.5 meq/L (ref 3.5–5.1)
Sodium: 142 (ref 137–147)

## 2023-02-24 LAB — COMPREHENSIVE METABOLIC PANEL WITH GFR
Albumin: 3.9 (ref 3.5–5.0)
Calcium: 9 (ref 8.7–10.7)
Globulin: 2.2
eGFR: 51

## 2023-02-24 LAB — CBC: RBC: 3.79 — AB (ref 3.87–5.11)

## 2023-02-24 LAB — HEMOGLOBIN A1C: Hemoglobin A1C: 7.7

## 2023-02-26 ENCOUNTER — Non-Acute Institutional Stay: Payer: PPO | Admitting: Adult Health

## 2023-02-26 ENCOUNTER — Encounter: Payer: Self-pay | Admitting: Adult Health

## 2023-02-26 DIAGNOSIS — G253 Myoclonus: Secondary | ICD-10-CM

## 2023-02-26 DIAGNOSIS — H5711 Ocular pain, right eye: Secondary | ICD-10-CM

## 2023-02-26 NOTE — Progress Notes (Signed)
Location:  [facility] illustr[facility] of Service:  ALF (13) Provider:   Peggye Ley, ANP [facility] [phone]   Mahlon Gammon, MD  Patient Care Team: Mahlon Gammon, MD as PCP - General (Internal Medicine) Kathleene Hazel, MD as PCP - Cardiology (Cardiology) Drema Dallas, DO as Consulting Physician (Neurology)  Extended Emergency Contact Information Primary Emergency Contact: [facility] Address: [address]          [place], [place] [address] Darden Amber of [place] Home Phone: [phone] Mobile Phone: [phone] Relation: Daughter Secondary Emergency Contact: [facility] Phone: [phone] Relation: Spouse Preferred language: English Interpreter needed? No  Code Status:  DNR Goals of care: Advanced Directive information    02/23/2023    9:40 AM  Advanced Directives  Does Patient Have a Medical Advance Directive? Yes  Type of Advance Directive Out of facility DNR (pink MOST or yellow form)  Does patient want to make changes to medical advance directive? No - Patient declined     Chief Complaint  Patient presents with   Acute Visit    Tremor, eye pain    HPI:  Pt is a 87 y.o. male seen today for an acute visit for tremors and eye pain on the right.   PMH: dementia, MI, HTN, CAD, carotid stenosis, sleep apnea, type 2 DM, neuropathy, CKD, HLD, depression.   Noted hx of retinopathy and nuclear sclerosis. Sees Dr Charlotte Sanes. Dr Margaretmary Eddy name is also on the chart.  Reports right eye pain this am "like a hot poker" in his right eye. No drainage or redness. No fever. No change in vision. No hx of headaches reported.   Also the nurse reports coarse tremor and jerking of both arms this morning that comes and goes. Temp 97.6 CBG 133. He is alert and answering questions, no acute distress. NO focal weakness  On 8/19 Depakote and Remeron were increased due to issues related to behavior and sleep. Nurse reports there  were no complaints of insomnia last evening.    Past Medical History:  Diagnosis Date   CAD (coronary artery disease)    Bare meta stent RCA   Carotid stenosis    a. Carotid US 3/17: bilateral ICA 1-39% >> repeat 2 years   Coronary atherosclerosis of native coronary artery 08/24/2008   S/P PCI   Dementia 12/15/2021   concerns for Alzheimer's disease   Essential hypertension, benign 08/24/2008   Fatigue 08/08/2010   History of nuclear stress test    Myoview 12/16: EF 66%, very mild inferior ischemia; Low Risk   Mixed hyperlipidemia 08/24/2008   Myocardial infarction 10/06/2007   Nonproliferative diabetic retinopathy 08/08/2011   Nuclear sclerosis of both eyes 12/01/2017   Peripheral neuropathy    Pulmonary nodule, right 09/13/2007   Right upper lobe         Sinoatrial node dysfunction 08/24/2008   Chronotropic incompetence   Snoring 09/27/2009   Type II diabetes mellitus 08/08/2011   Past Surgical History:  Procedure Laterality Date   LEFT HEART CATHETERIZATION WITH CORONARY ANGIOGRAM N/A 03/04/2012   Procedure: LEFT HEART CATHETERIZATION WITH CORONARY ANGIOGRAM;  Surgeon: Kathleene Hazel, MD;  Location: [facility] CATH LAB;  Service: Cardiovascular;  Laterality: N/A;   TONSILLECTOMY      Allergies  Allergen Reactions   Ace Inhibitors    Alpha Lipoic Acid [Lipoic Acid]     Upset Stomach   Invokana [Canagliflozin]    Heparin Hives   Isosorbide Mononitrate [Isosorbide Dinitrate Er]  Hives   Penicillins Hives   Sulfonamide Derivatives Hives    Outpatient Encounter Medications as of 02/26/2023  Medication Sig   amLODipine (NORVASC) 10 MG tablet TAKE 1 TABLET ONCE DAILY.   aspirin 81 MG tablet Take 1 tablet (81 mg total) by mouth daily.   atorvastatin (LIPITOR) 10 MG tablet Take 10 mg by mouth daily.   citalopram (CELEXA) 20 MG tablet Take 20 mg by mouth daily.   Cyanocobalamin (VITAMIN B 12) 500 MCG TABS Take 2 tablets by mouth every other day.   divalproex (DEPAKOTE ER)  250 MG 24 hr tablet Take 500 mg by mouth at bedtime.   famotidine (PEPCID) 20 MG tablet Take 20 mg by mouth 2 (two) times daily.   ferrous sulfate 325 (65 FE) MG EC tablet Take 325 mg by mouth 3 (three) times a week.   hydrochlorothiazide (HYDRODIURIL) 25 MG tablet TAKE 1 TABLET ONCE DAILY.   hydrOXYzine (VISTARIL) 25 MG capsule Take 1 capsule (25 mg total) by mouth 2 (two) times daily as needed (increased agitation/ sundowning).   hydrOXYzine (VISTARIL) 25 MG capsule Take 25 mg by mouth at bedtime.   insulin aspart (NOVOLOG) 100 UNIT/ML injection Inject 10 Units into the skin once. With lunch and Dinner Only if CBGS more then 200   LANTUS SOLOSTAR 100 UNIT/ML injection Inject 38 Units into the skin every morning.   latanoprost (XALATAN) 0.005 % ophthalmic solution Place 1 drop into both eyes at bedtime.   memantine (NAMENDA) 10 MG tablet TAKE ONE TABLET BY MOUTH EVERY MORNING and TAKE ONE TABLET BY MOUTH EVERYDAY AT BEDTIME   metformin (FORTAMET) 500 MG (OSM) 24 hr tablet Take 500 mg by mouth 2 (two) times daily with a meal.   mirtazapine (REMERON) 15 MG tablet Take 15 mg by mouth at bedtime.   rivastigmine (EXELON) 1.5 MG capsule TAKE ONE CAPSULE BY MOUTH TWICE DAILY   spironolactone (ALDACTONE) 25 MG tablet Take 25 mg by mouth daily.   No facility-administered encounter medications on file as of 02/26/2023.    Review of Systems  Constitutional:  Negative for activity change, appetite change, chills, diaphoresis, fatigue, fever and unexpected weight change.  HENT:  Negative for congestion.   Eyes:  Positive for pain. Negative for photophobia, discharge, redness, itching and visual disturbance.  Respiratory:  Negative for cough, shortness of breath, wheezing and stridor.   Cardiovascular:  Negative for chest pain, palpitations and leg swelling.  Gastrointestinal:  Negative for abdominal distention, abdominal pain, constipation and diarrhea.  Genitourinary:  Negative for difficulty urinating  and dysuria.  Musculoskeletal:  Positive for gait problem. Negative for arthralgias, back pain, joint swelling and myalgias.  Neurological:  Positive for tremors. Negative for dizziness, seizures, syncope, facial asymmetry, speech difficulty, weakness, numbness and headaches.  Hematological:  Negative for adenopathy. Does not bruise/bleed easily.  Psychiatric/Behavioral:  Positive for agitation, behavioral problems and confusion. Negative for hallucinations. The patient is not nervous/anxious.     Immunization History  Administered Date(s) Administered   Covid-19, Mrna,Vaccine(Spikevax)39yrs and older 05/07/2022   Fluad Quad(high Dose 65+) 04/08/2022   Influenza Split 05/01/2010, 03/11/2011, 04/06/2012, 04/21/2013, 05/03/2021   Influenza Whole 03/07/2009   Influenza, High Dose Seasonal PF 03/29/2014, 04/02/2015, 03/31/2016, 04/08/2017, 04/16/2018   Influenza-Unspecified 04/16/2019, 03/29/2020   Moderna Sars-Covid-2 Vaccination 07/19/2019, 08/17/2019, 06/01/2020, 12/09/2021   Pneumococcal Conjugate-13 03/29/2014   Pneumococcal Polysaccharide-23 10/31/2003, 09/07/2006, 03/25/2013   Td (Adult) 10/30/1993   Tdap 07/07/2010, 03/19/2011   Zoster, Live 10/30/2005, 10/21/2016, 12/24/2016   Zoster, Unspecified  10/24/2016   Pertinent  Health Maintenance Due  Topic Date Due   INFLUENZA VACCINE  02/05/2023   OPHTHALMOLOGY EXAM  07/30/2023   HEMOGLOBIN A1C  08/27/2023   FOOT EXAM  10/07/2023      12/13/2021    1:30 PM 03/25/2022    2:29 PM 08/12/2022    2:44 PM 09/23/2022    1:07 PM 10/07/2022   10:32 AM  Fall Risk  Falls in the past year?  0 1 0 1  Was there an injury with Fall? 1 0 0 0 0  Fall Risk Category Calculator  0 2 0 1  Fall Risk Category (Retired)  Low     (RETIRED) Patient Fall Risk Level High fall risk Low fall risk     Patient at Risk for Falls Due to   History of fall(s);Impaired mobility;Impaired balance/gait  History of fall(s)  Fall risk Follow up    Falls evaluation  completed Falls evaluation completed   Functional Status Survey:    Vitals:   02/26/23 1116  BP: (!) 166/76  Pulse: 61  Resp: 20  Temp: 97.6 F (36.4 C)  SpO2: 96%   There is no height or weight on file to calculate BMI. Physical Exam Vitals and nursing note reviewed.  Constitutional:      General: He is not in acute distress.    Appearance: He is not diaphoretic.  HENT:     Head: Normocephalic and atraumatic.     Nose: Nose normal.     Comments: Clear drainage out of right nare.     Mouth/Throat:     Mouth: Mucous membranes are moist.     Pharynx: Oropharynx is clear.  Eyes:     General: Lids are normal. Vision grossly intact.        Right eye: No discharge.        Left eye: No discharge.     Extraocular Movements: Extraocular movements intact.     Right eye: No nystagmus.     Left eye: No nystagmus.     Conjunctiva/sclera: Conjunctivae normal.     Pupils: Pupils are equal, round, and reactive to light.  Neck:     Thyroid: No thyromegaly.     Vascular: No JVD.     Trachea: No tracheal deviation.  Cardiovascular:     Rate and Rhythm: Normal rate and regular rhythm.     Heart sounds: No murmur heard. Pulmonary:     Effort: Pulmonary effort is normal. No respiratory distress.     Breath sounds: Normal breath sounds. No wheezing.  Abdominal:     General: Bowel sounds are normal. There is no distension.     Palpations: Abdomen is soft.     Tenderness: There is no abdominal tenderness.  Musculoskeletal:     Cervical back: Neck supple. No rigidity or tenderness.     Right lower leg: No edema.     Left lower leg: No edema.  Lymphadenopathy:     Cervical: No cervical adenopathy.  Skin:    General: Skin is warm and dry.  Neurological:     General: No focal deficit present.     Mental Status: He is alert. Mental status is at baseline.     Cranial Nerves: No cranial nerve deficit.     Motor: No weakness.     Comments: Strength 4/5 to BUE and BLE Coarse  intermittent jerking of BUE     Labs reviewed: Recent Labs    08/19/22 0000 01/14/23 0410  02/24/23 0000  NA 138 140 142  K 4.5 4.3 4.5  CL 101 107 106  CO2  --  23 24*  GLUCOSE  --  93  --   BUN 27* 30* 33*  CREATININE 1.3 1.61* 1.4*  CALCIUM 9.1 9.1 9.0   Recent Labs    08/19/22 0000 02/24/23 0000  AST 27 32  ALT 22 25  ALKPHOS  --  104  ALBUMIN 4.0 3.9   Recent Labs    08/19/22 0000 01/14/23 0410 02/24/23 0000  WBC 6.0 8.5 5.7  NEUTROABS  --  6.7  --   HGB 12.5* 12.1* 11.9*  HCT 37* 35.9* 36*  MCV  --  94.7  --   PLT 261 217 294   Lab Results  Component Value Date   TSH 2.58 08/19/2022   Lab Results  Component Value Date   HGBA1C 7.7 02/24/2023   Lab Results  Component Value Date   CHOL 136 08/19/2022   HDL 37 08/19/2022   LDLCALC 80 08/19/2022   TRIG 96 08/19/2022   CHOLHDL 3.0 09/04/2007    Significant Diagnostic Results in last 30 days:  No results found.  Assessment/Plan  1. Pain of right eye No sign of infection or acute vision change No focal deficit on exam.  Does have slight clear drainage out of right nare ? Cluster headache (nurse gave tylenol) Will need urgent ophthalmology referral for further exam  2. Myoclonic jerking ? If this is due to medication Will hold Remeron and monitor for improvement.    Family/ staff Communication: discussed plan with Dr Chales Abrahams and Hilda Lias his daughter   Labs/tests ordered:  Had labs done on 8/20 which were reviewed and WNL   Total time :  time greater than 50% of total time spent doing pt counseling and coordination of care

## 2023-03-31 ENCOUNTER — Encounter: Payer: Self-pay | Admitting: Physician Assistant

## 2023-03-31 ENCOUNTER — Ambulatory Visit: Payer: PPO | Admitting: Physician Assistant

## 2023-03-31 VITALS — BP 151/77 | HR 82 | Resp 20 | Ht 74.0 in

## 2023-03-31 DIAGNOSIS — F0280 Dementia in other diseases classified elsewhere without behavioral disturbance: Secondary | ICD-10-CM | POA: Diagnosis not present

## 2023-03-31 DIAGNOSIS — G301 Alzheimer's disease with late onset: Secondary | ICD-10-CM | POA: Diagnosis not present

## 2023-03-31 NOTE — Patient Instructions (Signed)
It was a pleasure to see you today at our office.   Recommendations:  Follow up in 6 months   Memantine 10 mg  twice daily.Side effects were discussed   Continue rivastigmine 1.5 mg2 times a day    Whom to call:  Memory  decline, memory medications: Call our office [phone]   For psychiatric meds, mood meds: Please have your primary care physician manage these medications.   Counseling regarding caregiver distress, including caregiver depression, anxiety and issues regarding community resources, adult day care programs, adult living facilities, or memory care questions:   Feel free to contact Misty Lisabeth Register, Social Worker at [phone]   For assessment of decision of mental capacity and competency:  Call Dr. Erick Blinks, geriatric psychiatrist at [phone]  For guidance in geriatric dementia issues please call [facility] [phone]  For guidance regarding [facility] and if placement were needed at the facility, contact Sidney Ace, Social Worker tel: [phone]  If you have any severe symptoms of a stroke, or other severe issues such as confusion,severe chills or fever, etc call 911 or go to the ER as you may need to be evaluated further   Feel free to visit Facebook page " [email or web address]" for tips of how to care for people with memory problems.       RECOMMENDATIONS FOR ALL PATIENTS WITH MEMORY PROBLEMS: 1. Continue to exercise (Recommend 30 minutes of walking everyday, or 3 hours every week) 2. Increase social interactions - continue going to [place] and enjoy social gatherings with friends and family 3. Eat healthy, avoid fried foods and eat more fruits and vegetables 4. Maintain adequate blood pressure, blood sugar, and blood cholesterol level. Reducing the risk of stroke and cardiovascular disease also helps promoting better memory. 5. Avoid stressful situations. Live a simple life and avoid aggravations. Organize your time  and prepare for the next day in anticipation. 6. Sleep well, avoid any interruptions of sleep and avoid any distractions in the bedroom that may interfere with adequate sleep quality 7. Avoid sugar, avoid sweets as there is a strong link between excessive sugar intake, diabetes, and cognitive impairment We discussed the Mediterranean diet, which has been shown to help patients reduce the risk of progressive memory disorders and reduces cardiovascular risk. This includes eating fish, eat fruits and green leafy vegetables, nuts like almonds and hazelnuts, walnuts, and also use olive oil. Avoid fast foods and fried foods as much as possible. Avoid sweets and sugar as sugar use has been linked to worsening of memory function.  There is always a concern of gradual progression of memory problems. If this is the case, then we may need to adjust level of care according to patient needs. Support, both to the patient and caregiver, should then be put into place.    The Alzheimer[employer] Association is here all day, every day for people facing Alzheimer's disease through our free 24/7 Helpline: [phone]. The Helpline provides reliable information and support to all those who need assistance, such as individuals living with memory loss, Alzheimer's or other dementia, caregivers, health care professionals and the public.  Our highly trained and knowledgeable staff can help you with: Understanding memory loss, dementia and Alzheimer's  Medications and other treatment options  General information about aging and brain health  Skills to provide quality care and to find the best care from professionals  Legal, financial and living-arrangement decisions Our Helpline also features: Confidential care consultation provided by master's level clinicians who  can help with decision-making support, crisis assistance and education on issues families face every day  Help in a caller's preferred language using our translation  service that features more than 200 languages and dialects  Referrals to local community programs, services and ongoing support     FALL PRECAUTIONS: Be cautious when walking. Scan the area for obstacles that may increase the risk of trips and falls. When getting up in the mornings, sit up at the edge of the bed for a few minutes before getting out of bed. Consider elevating the bed at the head end to avoid drop of blood pressure when getting up. Walk always in a well-lit room (use night lights in the walls). Avoid area rugs or power cords from appliances in the middle of the walkways. Use a walker or a cane if necessary and consider physical therapy for balance exercise. Get your eyesight checked regularly.  FINANCIAL OVERSIGHT: Supervision, especially oversight when making financial decisions or transactions is also recommended.  HOME SAFETY: Consider the safety of the kitchen when operating appliances like stoves, microwave oven, and blender. Consider having supervision and share cooking responsibilities until no longer able to participate in those. Accidents with firearms and other hazards in the house should be identified and addressed as well.   ABILITY TO BE LEFT ALONE: If patient is unable to contact 911 operator, consider using LifeLine, or when the need is there, arrange for someone to stay with patients. Smoking is a fire hazard, consider supervision or cessation. Risk of wandering should be assessed by caregiver and if detected at any point, supervision and safe proof recommendations should be instituted.  MEDICATION SUPERVISION: Inability to self-administer medication needs to be constantly addressed. Implement a mechanism to ensure safe administration of the medications.   DRIVING: Regarding driving, in patients with progressive memory problems, driving will be impaired. We advise to have someone else do the driving if trouble finding directions or if minor accidents are reported.  Independent driving assessment is available to determine safety of driving.   If you are interested in the driving assessment, you can contact the following:  [employer] in [place] [phone]  [facility] [phone]  [facility] [phone] or [phone]      Mediterranean Diet A Mediterranean diet refers to food and lifestyle choices that are based on the traditions of countries located on the [employer]. This way of eating has been shown to help prevent certain conditions and improve outcomes for people who have chronic diseases, like kidney disease and heart disease. What are tips for following this plan? Lifestyle  Cook and eat meals together with your family, when possible. Drink enough fluid to keep your urine clear or pale yellow. Be physically active every day. This includes: Aerobic exercise like running or swimming. Leisure activities like gardening, walking, or housework. Get 7-8 hours of sleep each night. If recommended by your health care provider, drink red wine in moderation. This means 1 glass a day for nonpregnant women and 2 glasses a day for men. A glass of wine equals 5 oz (150 mL). Reading food labels  Check the serving size of packaged foods. For foods such as rice and pasta, the serving size refers to the amount of cooked product, not dry. Check the total fat in packaged foods. Avoid foods that have saturated fat or trans fats. Check the ingredients list for added sugars, such as corn syrup. Shopping  At [employer],  buy most of your food from the areas near the walls of the store. This includes: Fresh fruits and vegetables (produce). Grains, beans, nuts, and seeds. Some of these may be available in unpackaged forms or large amounts (in bulk). Fresh seafood. Poultry and eggs. Low-fat dairy products. Buy whole ingredients instead of prepackaged foods. Buy fresh  fruits and vegetables in-season from local farmers markets. Buy frozen fruits and vegetables in resealable bags. If you do not have access to quality fresh seafood, buy precooked frozen shrimp or canned fish, such as tuna, salmon, or sardines. Buy small amounts of raw or cooked vegetables, salads, or olives from the deli or salad bar at your store. Stock your pantry so you always have certain foods on hand, such as olive oil, canned tuna, canned tomatoes, rice, pasta, and beans. Cooking  Cook foods with extra-virgin olive oil instead of using butter or other vegetable oils. Have meat as a side dish, and have vegetables or grains as your main dish. This means having meat in small portions or adding small amounts of meat to foods like pasta or stew. Use beans or vegetables instead of meat in common dishes like chili or lasagna. Experiment with different cooking methods. Try roasting or broiling vegetables instead of steaming or sauteing them. Add frozen vegetables to soups, stews, pasta, or rice. Add nuts or seeds for added healthy fat at each meal. You can add these to yogurt, salads, or vegetable dishes. Marinate fish or vegetables using olive oil, lemon juice, garlic, and fresh herbs. Meal planning  Plan to eat 1 vegetarian meal one day each week. Try to work up to 2 vegetarian meals, if possible. Eat seafood 2 or more times a week. Have healthy snacks readily available, such as: Vegetable sticks with hummus. Greek yogurt. Fruit and nut trail mix. Eat balanced meals throughout the week. This includes: Fruit: 2-3 servings a day Vegetables: 4-5 servings a day Low-fat dairy: 2 servings a day Fish, poultry, or lean meat: 1 serving a day Beans and legumes: 2 or more servings a week Nuts and seeds: 1-2 servings a day Whole grains: 6-8 servings a day Extra-virgin olive oil: 3-4 servings a day Limit red meat and sweets to only a few servings a month What are my food choices? Mediterranean  diet Recommended Grains: Whole-grain pasta. Brown rice. Bulgar wheat. Polenta. Couscous. Whole-wheat bread. Orpah Cobb. Vegetables: Artichokes. Beets. Broccoli. Cabbage. Carrots. Eggplant. Green beans. Chard. Kale. Spinach. Onions. Leeks. Peas. Squash. Tomatoes. Peppers. Radishes. Fruits: Apples. Apricots. Avocado. Berries. Bananas. Cherries. Dates. Figs. Grapes. Lemons. Melon. Oranges. Peaches. Plums. Pomegranate. Meats and other protein foods: Beans. Almonds. Sunflower seeds. Pine nuts. Peanuts. Cod. Salmon. Scallops. Shrimp. Tuna. Tilapia. Clams. Oysters. Eggs. Dairy: Low-fat milk. Cheese. Greek yogurt. Beverages: Water. Red wine. Herbal tea. Fats and oils: Extra virgin olive oil. Avocado oil. Grape seed oil. Sweets and desserts: [place] yogurt with honey. Baked apples. Poached pears. Trail mix. Seasoning and other foods: Basil. Cilantro. Coriander. Cumin. Mint. Parsley. Sage. Rosemary. Tarragon. Garlic. Oregano. Thyme. Pepper. Balsalmic vinegar. Tahini. Hummus. Tomato sauce. Olives. Mushrooms. Limit these Grains: Prepackaged pasta or rice dishes. Prepackaged cereal with added sugar. Vegetables: Deep fried potatoes (french fries). Fruits: Fruit canned in syrup. Meats and other protein foods: Beef. Pork. Lamb. Poultry with skin. Hot dogs. Tomasa Blase. Dairy: Ice cream. Sour cream. Whole milk. Beverages: Juice. Sugar-sweetened soft drinks. Beer. Liquor and spirits. Fats and oils: Butter. Canola oil. Vegetable oil. Beef fat (tallow). Lard. Sweets and desserts: Cookies. Cakes. Pies. Candy. Seasoning  and other foods: Mayonnaise. Premade sauces and marinades. The items listed may not be a complete list. Talk with your dietitian about what dietary choices are right for you. Summary The Mediterranean diet includes both food and lifestyle choices. Eat a variety of fresh fruits and vegetables, beans, nuts, seeds, and whole grains. Limit the amount of red meat and sweets that you eat. Talk with your  health care provider about whether it is safe for you to drink red wine in moderation. This means 1 glass a day for nonpregnant women and 2 glasses a day for men. A glass of wine equals 5 oz (150 mL). This information is not intended to replace advice given to you by your health care provider. Make sure you discuss any questions you have with your health care provider. Document Released: 02/14/2016 Document Revised: 03/18/2016 Document Reviewed: 02/14/2016 Elsevier Interactive Patient Education  2017 [employer].   We have sent a referral to [facility] for your MRI and they will call you directly to schedule your appointment. They are located at [address] [facility]. If you need to contact them directly please call [phone].  Your provider has requested that you have labwork completed today. Please go to [facility] ([id number]) on the second floor of this building before leaving the office today. You do not need to check in. If you are not called within 15 minutes please check with the front desk.

## 2023-03-31 NOTE — Progress Notes (Signed)
Assessment/Plan:   Dementia likely due to Alzheimer's disease    [name] is a very pleasant 87 y.o. RH male with a history of hypertension, hyperlipidemia, DM with peripheral neuropathy and diabetic retinopathy, bilateral mild ICA stenosis, CHF, history of sick sinus syndrome, anxiety, B12 deficiency, anemia of chronic disease, hypothyroidism, history of Gilbert's syndrome, stage III CKD, ITP, OSA with failed CPAP in September 2006 with a diagnosis of dementia due to Alzheimer's disease seen today in follow up for memory loss. Patient is currently on memantine 10 mg twice daily and rivastigmine 1.5 mg twice daily, unable to  increase it due to diarrhea.  Memory may be worse, asking the same questions multiple times including "why am I here ", where do I leave ", do I have a wife ", etc.  Now resides on memory care, and his daughter reports that he is enjoying the facility very much.    Follow up in 6  months. Continue memantine 10 mg twice daily and rivastigmine 1.5 mg twice daily, side effects discussed.  Recommend good control of her cardiovascular risk factors Continue to control mood as per PCP     Subjective:    This patient is accompanied in the office by his daughter who supplements the history.  Previous records as well as any outside records available were reviewed prior to todays visit. Patient was last seen on 09/23/2022   Any changes in memory since last visit? "Maybe a little worse".  Moved to memory care at [facility], he loves it there".  Sometimes he forgets the topic of conversation or names of his grandchildren. Today he did not remember his daughter's name.  He has not been doing any brain games.  Likes to read.  He likes to enjoy the activities that memory care offers to him. Repeats oneself?  Endorsed more frequently than prior. Disoriented when walking into a room?  Sometimes he does not recognize his house or that he is in [place]. He refers to his house  as his childhood home.   Leaving objects in unusual places?  May misplace things more often. Wandering behavior?  He is in memory care facility, "if he does not have a chance to wander ". Any personality changes since last visit? "He is doing great now".  Any worsening depression?:  Denies.   Hallucinations or paranoia?  Denies.    Seizures? denies    Any sleep changes?  Sleeps a lot during the day because he wakes up at night, and rearranges clothing in drawers. Denies vivid dreams, REM behavior or sleepwalking   Sleep apnea?   Denies.   Any hygiene concerns?  He does not want to shower, the staff helps . Independent of bathing and dressing?  Staff has to assist him. Does the patient needs help with medications? Staff  is in charge Who is in charge of the finances?  Daughter is in charge     Any changes in appetite?   It is good  Patient have trouble swallowing? Denies.   Does the patient cook?  No  Any headaches?   denies   Chronic back pain  denies   Ambulates with difficulty?  Uses a wheelchair, "has a lot of trouble walking ".  Recent falls or head injuries?  Recent fall when he got up to go to the bathroom  but did not get hurt  Unilateral weakness, numbness or tingling? denies   Any tremors?  Very mild intention tremors, not at rest.  Any anosmia?  Denies   Any incontinence of urine?  Endorsed, wears diapers  Any bowel dysfunction?  May have stool incontinence.    Patient lives with wife at [facility]  Does the patient drive? No longer drives   MRI of the brain, personally reviewed was remarkable for advanced atrophy and findings chronic small vessel disease.  Neuropsych evaluation 06/10/22  Briefly, results suggested a profound impairment surrounding expressive language (i.e., sentence repetition, confrontation naming, phonemic and semantic fluency). Additional impairments were exhibited across processing speed, executive functioning, and both encoding (i.e.,  learning) and delayed retrieval aspects of memory. A further weakness was noted across attention/concentration while performance variability was exhibited across recognition aspects of verbal memory. Concerns surrounding underlying Alzheimer's disease are reasonable. However, he did not exhibit a fully classic Alzheimer's disease pattern across testing. He did not benefit from repeated exposure to information across learning trials and was fully amnestic (i.e., 0% retention) across list and figure-based memory tasks. However, he retained 100% of information across a story-based task (albeit, his raw score was 2) and, more importantly, performed well across list and story recognition tasks. While this raises concerns for rapid forgetting, there is not current evidence to suggest a consistently profound storage impairment. This could suggest that this illness remains in earlier stages than perhaps what might have been anticipated by his medical team. The vascular contribution is less clear as there was no mention of microvascular ischemia severity by the neuroradiologist who read [name] recent MRI. While there is no stroke history, he does appear to exhibit relatively advanced ischemia by my personal review. While this could be a contributing factor, it would appear secondary to the presence of a neurodegenerative illness.       Initial visit for memory 12/13/21  How long did patient have memory difficulties?   His daughter and his wife noted memory changes dating back to 5-6 years ago, attributed to "age", but they report worsening over the last 6 months " he does not remember that he has grandchildren"-daughter says. STM worse than his LTM though.  Patient lives with: [place] [place].    repeats oneself? Endorsed, especially with appointments Disoriented when walking into a room? Daughter reports that at times he says "I want to go home, referring to his childhood home" Leaving objects in unusual  places?  Patient denies but daughter states that " things get lost all the time , such as the wallet on the bed sheets"  Ambulates  with difficulty?   Patient has a history of diabetic neuropathy, and has been evaluated in the past confirming the diagnosis.  He is using a cane to ambulate, he admits to not having been walking frequently or being as active. PT was recommended but patient declines. He does not participate often in any activities at [place] because "it is too far and I need the car to get there" Recent falls?  " fell out of bed a few weeks ago when I was turning, hurt my pride " Any head injuries?  Patient denies   History of seizures?   Patient denies   Wandering behavior?  Patient denies  Patient drives?   He still drives but in one incident, didn't know how to get home. "He has decreased reaction time"-wife says. "He is afraid to lose his license"   Any mood changes such irritability agitation?  Very angry because he lost his car key and cannot drive.  Any history of depression?: Had temporarily depression in  the distant past, but he denies any current symptoms  Hallucinations?  Patient denies   Paranoia?  Patient denies   Patient reports that he sleeps well without vivid dreams, REM behavior or sleepwalking but his wife reports that at 2 am, he gets up and starts to go though different drawers   History of sleep apnea?  Patient denies   Any hygiene concerns?  Patient denies   Independent of bathing and dressing?  Endorsed  Does the patient needs help with medications?  Wife is in charge Who is in charge of the finances?  Daughter  Any changes in appetite?  Patient denies , lost some weight,because "the cafeteria is too far and I stay at home, eat what I have there"   Patient have trouble swallowing? Patient denies   Does the patient cook?  Patient denies   Any kitchen accidents such as leaving the stove on? Patient denies   Any headaches?  Patient denies   The double vision?  Patient denies   Any focal numbness or tingling?  Patient denies   Chronic back pain Patient has chronic pain and has refused PT in the past  Unilateral weakness?  Patient denies   Any tremors?  Patient denies   Any history of anosmia?  Patient denies   Any incontinence of urine?  Endorsed, uses diapers  Any bowel dysfunction?  Chronic diarrhea History of heavy alcohol intake?  Patient denies   History of heavy tobacco use?  Patient denies   Family history of dementia?  Patient  Mother had AD Dementia   Brother LBD     Labs : 12/06/2021, lipid panel normal, A1c 7.9, total bilirubin 1.4, CBC with MCV 94.2, H&H 12.4-35.9 respectively, platelets 228    PREVIOUS MEDICATIONS:   CURRENT MEDICATIONS:  Outpatient Encounter Medications as of 03/31/2023  Medication Sig   amLODipine (NORVASC) 10 MG tablet TAKE 1 TABLET ONCE DAILY.   aspirin 81 MG tablet Take 1 tablet (81 mg total) by mouth daily.   atorvastatin (LIPITOR) 10 MG tablet Take 10 mg by mouth daily.   citalopram (CELEXA) 20 MG tablet Take 20 mg by mouth daily.   Cyanocobalamin (VITAMIN B 12) 500 MCG TABS Take 2 tablets by mouth every other day.   divalproex (DEPAKOTE ER) 250 MG 24 hr tablet Take 500 mg by mouth at bedtime.   famotidine (PEPCID) 20 MG tablet Take 20 mg by mouth 2 (two) times daily.   ferrous sulfate 325 (65 FE) MG EC tablet Take 325 mg by mouth 3 (three) times a week.   hydrochlorothiazide (HYDRODIURIL) 25 MG tablet TAKE 1 TABLET ONCE DAILY.   hydrOXYzine (VISTARIL) 25 MG capsule Take 1 capsule (25 mg total) by mouth 2 (two) times daily as needed (increased agitation/ sundowning).   hydrOXYzine (VISTARIL) 25 MG capsule Take 25 mg by mouth at bedtime.   insulin aspart (NOVOLOG) 100 UNIT/ML injection Inject 10 Units into the skin once. With lunch and Dinner Only if CBGS more then 200   LANTUS SOLOSTAR 100 UNIT/ML injection Inject 38 Units into the skin every morning.   latanoprost (XALATAN) 0.005 % ophthalmic solution  Place 1 drop into both eyes at bedtime.   memantine (NAMENDA) 10 MG tablet TAKE ONE TABLET BY MOUTH EVERY MORNING and TAKE ONE TABLET BY MOUTH EVERYDAY AT BEDTIME   metformin (FORTAMET) 500 MG (OSM) 24 hr tablet Take 500 mg by mouth 2 (two) times daily with a meal.   rivastigmine (EXELON) 1.5 MG capsule TAKE ONE  CAPSULE BY MOUTH TWICE DAILY   spironolactone (ALDACTONE) 25 MG tablet Take 25 mg by mouth daily.   No facility-administered encounter medications on file as of 03/31/2023.       09/23/2022    4:00 PM  MMSE - Mini Mental State Exam  Orientation to time 4  Orientation to Place 4  Registration 3  Attention/ Calculation 5  Recall 0  Language- name 2 objects 2  Language- repeat 1  Language- follow 3 step command 3  Language- read & follow direction 1  Write a sentence 0  Copy design 0  Total score 23      12/15/2021   10:00 AM  Montreal Cognitive Assessment   Visuospatial/ Executive (0/5) 1  Naming (0/3) 2  Attention: Read list of digits (0/2) 1  Attention: Read list of letters (0/1) 1  Attention: Serial 7 subtraction starting at 100 (0/3) 3  Language: Repeat phrase (0/2) 2  Language : Fluency (0/1) 0  Abstraction (0/2) 1  Delayed Recall (0/5) 1  Orientation (0/6) 3  Total 15  Adjusted Score (based on education) 15    Objective:     PHYSICAL EXAMINATION:    VITALS:   Vitals:   03/31/23 1302  BP: (!) 151/77  Pulse: 82  Resp: 20  SpO2: 99%  Height: 6\' 2"  (1.88 m)    GEN:  The patient appears stated age and is in NAD. HEENT:  Normocephalic, atraumatic.   Neurological examination:  General: NAD, well-groomed, appears stated age. Orientation: The patient is alert.  Not oriented to person, place or date Cranial nerves: There is good facial symmetry.The speech is fluent and clear. No aphasia or dysarthria. Fund of knowledge is reduced.  Recent and remote memory are impaired. Attention and concentration are reduced.  Able to name objects and unable to repeat  phrases.  Hearing is intact to conversational tone. Sensation: Sensation is intact to light touch throughout Motor: Strength is at least antigravity x4. DTR's 2/4 in UE/LE     Movement examination: Tone: There is normal tone in the UE/LE Abnormal movements: Very mild intention tremors.  No myoclonus.  No asterixis.   Coordination:  There is mild decremation with RAM's.  Abnormal finger to nose  Gait and Station: The patient has difficulty arising out of a deep-seated chair without the use of the hands.  He is on a wheelchair, gait has not been tested.  Thank you for allowing [place] the opportunity to participate in the care of this nice patient. Please do not hesitate to contact us for any questions or concerns.   Total time spent on today's visit was 25 minutes dedicated to this patient today, preparing to see patient, examining the patient, ordering tests and/or medications and counseling the patient, documenting clinical information in the EHR or other health record, independently interpreting results and communicating results to the patient/family, discussing treatment and goals, answering patient's questions and coordinating care.  Cc:  Mahlon Gammon, MD  Marlowe Kays 03/31/2023 6:13 PM

## 2023-04-09 ENCOUNTER — Encounter: Payer: Self-pay | Admitting: Adult Health

## 2023-04-09 ENCOUNTER — Non-Acute Institutional Stay (INDEPENDENT_AMBULATORY_CARE_PROVIDER_SITE_OTHER): Payer: PPO | Admitting: Adult Health

## 2023-04-09 DIAGNOSIS — Z0000 Encounter for general adult medical examination without abnormal findings: Secondary | ICD-10-CM

## 2023-04-09 MED ORDER — MIRTAZAPINE 15 MG PO TBDP: 15.0000 mg | ORAL_TABLET | Freq: Every day | ORAL | Status: DC

## 2023-04-09 NOTE — Patient Instructions (Signed)
[name] , Thank you for taking time to come for your Medicare Wellness Visit. I appreciate your ongoing commitment to your health goals. Please review the following plan we discussed and let me know if I can assist you in the future.   Screening recommendations/referrals: Colonoscopy aged out Recommended yearly ophthalmology/optometry visit for glaucoma screening and checkup Recommended yearly dental visit for hygiene and checkup  Vaccinations: Influenza vaccine due annually in September/October Pneumococcal vaccine: up to date Tdap vaccine Due Shingles vaccine UP to date   Recommend covid vaccine  Advanced directives: reviewed  Fall risk Conditions/risks identified:   Next appointment: 1 year  Preventive Care 10 Years and Older, Male Preventive care refers to lifestyle choices and visits with your health care provider that can promote health and wellness. What does preventive care include? A yearly physical exam. This is also called an annual well check. Dental exams once or twice a year. Routine eye exams. Ask your health care provider how often you should have your eyes checked. Personal lifestyle choices, including: Daily care of your teeth and gums. Regular physical activity. Eating a healthy diet. Avoiding tobacco and drug use. Limiting alcohol use. Practicing safe sex. Taking low doses of aspirin every day. Taking vitamin and mineral supplements as recommended by your health care provider. What happens during an annual well check? The services and screenings done by your health care provider during your annual well check will depend on your age, overall health, lifestyle risk factors, and family history of disease. Counseling  Your health care provider may ask you questions about your: Alcohol use. Tobacco use. Drug use. Emotional well-being. Home and relationship well-being. Sexual activity. Eating habits. History of falls. Memory and ability to  understand (cognition). Work and work Astronomer. Screening  You may have the following tests or measurements: Height, weight, and BMI. Blood pressure. Lipid and cholesterol levels. These may be checked every 5 years, or more frequently if you are over 71 years old. Skin check. Lung cancer screening. You may have this screening every year starting at age 47 if you have a 30-pack-year history of smoking and currently smoke or have quit within the past 15 years. Fecal occult blood test (FOBT) of the stool. You may have this test every year starting at age 46. Flexible sigmoidoscopy or colonoscopy. You may have a sigmoidoscopy every 5 years or a colonoscopy every 10 years starting at age 57. Prostate cancer screening. Recommendations will vary depending on your family history and other risks. Hepatitis C blood test. Hepatitis B blood test. Sexually transmitted disease (STD) testing. Diabetes screening. This is done by checking your blood sugar (glucose) after you have not eaten for a while (fasting). You may have this done every 1-3 years. Abdominal aortic aneurysm (AAA) screening. You may need this if you are a current or former smoker. Osteoporosis. You may be screened starting at age 83 if you are at high risk. Talk with your health care provider about your test results, treatment options, and if necessary, the need for more tests. Vaccines  Your health care provider may recommend certain vaccines, such as: Influenza vaccine. This is recommended every year. Tetanus, diphtheria, and acellular pertussis (Tdap, Td) vaccine. You may need a Td booster every 10 years. Zoster vaccine. You may need this after age 35. Pneumococcal 13-valent conjugate (PCV13) vaccine. One dose is recommended after age 69. Pneumococcal polysaccharide (PPSV23) vaccine. One dose is recommended after age 74. Talk to your health care provider about which screenings and  vaccines you need and how often you need them. This  information is not intended to replace advice given to you by your health care provider. Make sure you discuss any questions you have with your health care provider. Document Released: 07/20/2015 Document Revised: 03/12/2016 Document Reviewed: 04/24/2015 Elsevier Interactive Patient Education  2017 [employer].  Fall Prevention in the Home Falls can cause injuries. They can happen to people of all ages. There are many things you can do to make your home safe and to help prevent falls. What can I do on the outside of my home? Regularly fix the edges of walkways and driveways and fix any cracks. Remove anything that might make you trip as you walk through a door, such as a raised step or threshold. Trim any bushes or trees on the path to your home. Use bright outdoor lighting. Clear any walking paths of anything that might make someone trip, such as rocks or tools. Regularly check to see if handrails are loose or broken. Make sure that both sides of any steps have handrails. Any raised decks and porches should have guardrails on the edges. Have any leaves, snow, or ice cleared regularly. Use sand or salt on walking paths during winter. Clean up any spills in your garage right away. This includes oil or grease spills. What can I do in the bathroom? Use night lights. Install grab bars by the toilet and in the tub and shower. Do not use towel bars as grab bars. Use non-skid mats or decals in the tub or shower. If you need to sit down in the shower, use a plastic, non-slip stool. Keep the floor dry. Clean up any water that spills on the floor as soon as it happens. Remove soap buildup in the tub or shower regularly. Attach bath mats securely with double-sided non-slip rug tape. Do not have throw rugs and other things on the floor that can make you trip. What can I do in the bedroom? Use night lights. Make sure that you have a light by your bed that is easy to reach. Do not use any sheets or  blankets that are too big for your bed. They should not hang down onto the floor. Have a firm chair that has side arms. You can use this for support while you get dressed. Do not have throw rugs and other things on the floor that can make you trip. What can I do in the kitchen? Clean up any spills right away. Avoid walking on wet floors. Keep items that you use a lot in easy-to-reach places. If you need to reach something above you, use a strong step stool that has a grab bar. Keep electrical cords out of the way. Do not use floor polish or wax that makes floors slippery. If you must use wax, use non-skid floor wax. Do not have throw rugs and other things on the floor that can make you trip. What can I do with my stairs? Do not leave any items on the stairs. Make sure that there are handrails on both sides of the stairs and use them. Fix handrails that are broken or loose. Make sure that handrails are as long as the stairways. Check any carpeting to make sure that it is firmly attached to the stairs. Fix any carpet that is loose or worn. Avoid having throw rugs at the top or bottom of the stairs. If you do have throw rugs, attach them to the floor with carpet tape. Make  sure that you have a light switch at the top of the stairs and the bottom of the stairs. If you do not have them, ask someone to add them for you. What else can I do to help prevent falls? Wear shoes that: Do not have high heels. Have rubber bottoms. Are comfortable and fit you well. Are closed at the toe. Do not wear sandals. If you use a stepladder: Make sure that it is fully opened. Do not climb a closed stepladder. Make sure that both sides of the stepladder are locked into place. Ask someone to hold it for you, if possible. Clearly mark and make sure that you can see: Any grab bars or handrails. First and last steps. Where the edge of each step is. Use tools that help you move around (mobility aids) if they are  needed. These include: Canes. Walkers. Scooters. Crutches. Turn on the lights when you go into a dark area. Replace any light bulbs as soon as they burn out. Set up your furniture so you have a clear path. Avoid moving your furniture around. If any of your floors are uneven, fix them. If there are any pets around you, be aware of where they are. Review your medicines with your doctor. Some medicines can make you feel dizzy. This can increase your chance of falling. Ask your doctor what other things that you can do to help prevent falls. This information is not intended to replace advice given to you by your health care provider. Make sure you discuss any questions you have with your health care provider. Document Released: 04/19/2009 Document Revised: 11/29/2015 Document Reviewed: 07/28/2014 Elsevier Interactive Patient Education  2017 [employer].

## 2023-04-09 NOTE — Progress Notes (Signed)
Subjective:   [name] is a 87 y.o. male who presents for Medicare Annual/Subsequent preventive examination at [facility].   Visit Complete: In person  Patient Medicare AWV questionnaire was completed by the patient on 04/09/23; I have confirmed that all information answered by patient is correct and no changes since this date.  Cardiac Risk Factors include: advanced age (>39men, >53 women);diabetes mellitus     Objective:    Today's Vitals   04/09/23 1550  Weight: 185 lb (83.9 kg)   Body mass index is 23.75 kg/m.     03/31/2023    1:44 PM 03/31/2023    1:05 PM 02/23/2023    9:40 AM 02/02/2023    2:46 PM 01/19/2023   10:40 AM 10/07/2022   10:32 AM 09/23/2022    1:07 PM  Advanced Directives  Does Patient Have a Medical Advance Directive? Yes Yes Yes Yes Yes Yes Yes  Type of Advance Directive Healthcare Power of Attorney  Out of facility DNR (pink MOST or yellow form) Out of facility DNR (pink MOST or yellow form) Out of facility DNR (pink MOST or yellow form) Healthcare Power of Plain;Living will   Does patient want to make changes to medical advance directive? No - Patient declined No - Patient declined No - Patient declined No - Patient declined No - Patient declined  No - Patient declined  Copy of Healthcare Power of Attorney in Chart? No - copy requested     No - copy requested     Current Medications (verified) Outpatient Encounter Medications as of 04/09/2023  Medication Sig   mirtazapine (REMERON SOL-TAB) 15 MG disintegrating tablet Take 1 tablet (15 mg total) by mouth at bedtime.   amLODipine (NORVASC) 10 MG tablet TAKE 1 TABLET ONCE DAILY.   aspirin 81 MG tablet Take 1 tablet (81 mg total) by mouth daily.   atorvastatin (LIPITOR) 10 MG tablet Take 10 mg by mouth daily.   citalopram (CELEXA) 20 MG tablet Take 20 mg by mouth daily.   Cyanocobalamin (VITAMIN B 12) 500 MCG TABS Take 2 tablets by mouth every other day.    divalproex (DEPAKOTE ER) 250 MG 24 hr tablet Take 500 mg by mouth at bedtime.   famotidine (PEPCID) 20 MG tablet Take 20 mg by mouth 2 (two) times daily.   ferrous sulfate 325 (65 FE) MG EC tablet Take 325 mg by mouth 3 (three) times a week.   hydrochlorothiazide (HYDRODIURIL) 25 MG tablet TAKE 1 TABLET ONCE DAILY.   hydrOXYzine (VISTARIL) 25 MG capsule Take 1 capsule (25 mg total) by mouth 2 (two) times daily as needed (increased agitation/ sundowning).   hydrOXYzine (VISTARIL) 25 MG capsule Take 25 mg by mouth at bedtime.   insulin aspart (NOVOLOG) 100 UNIT/ML injection Inject 10 Units into the skin once. With lunch and Dinner Only if CBGS more then 200   LANTUS SOLOSTAR 100 UNIT/ML injection Inject 38 Units into the skin every morning.   latanoprost (XALATAN) 0.005 % ophthalmic solution Place 1 drop into both eyes at bedtime.   memantine (NAMENDA) 10 MG tablet TAKE ONE TABLET BY MOUTH EVERY MORNING and TAKE ONE TABLET BY MOUTH EVERYDAY AT BEDTIME   metformin (FORTAMET) 500 MG (OSM) 24 hr tablet Take 500 mg by mouth 2 (two) times daily with a meal.   rivastigmine (EXELON) 1.5 MG capsule TAKE ONE CAPSULE BY MOUTH TWICE DAILY   spironolactone (ALDACTONE) 25 MG tablet Take 25 mg by mouth daily.  No facility-administered encounter medications on file as of 04/09/2023.    Allergies (verified) Ace inhibitors, Alpha lipoic acid [lipoic acid], Invokana [canagliflozin], Heparin, Isosorbide mononitrate [isosorbide dinitrate er], Penicillins, and Sulfonamide derivatives   History: Past Medical History:  Diagnosis Date   CAD (coronary artery disease)    Bare meta stent RCA   Carotid stenosis    a. Carotid US 3/17: bilateral ICA 1-39% >> repeat 2 years   Coronary atherosclerosis of native coronary artery 08/24/2008   S/P PCI   Dementia 12/15/2021   concerns for Alzheimer's disease   Essential hypertension, benign 08/24/2008   Fatigue 08/08/2010   History of nuclear stress test    Myoview  12/16: EF 66%, very mild inferior ischemia; Low Risk   Mixed hyperlipidemia 08/24/2008   Myocardial infarction 10/06/2007   Nonproliferative diabetic retinopathy 08/08/2011   Nuclear sclerosis of both eyes 12/01/2017   Peripheral neuropathy    Pulmonary nodule, right 09/13/2007   Right upper lobe         Sinoatrial node dysfunction 08/24/2008   Chronotropic incompetence   Snoring 09/27/2009   Type II diabetes mellitus 08/08/2011   Past Surgical History:  Procedure Laterality Date   LEFT HEART CATHETERIZATION WITH CORONARY ANGIOGRAM N/A 03/04/2012   Procedure: LEFT HEART CATHETERIZATION WITH CORONARY ANGIOGRAM;  Surgeon: Kathleene Hazel, MD;  Location: [facility] CATH LAB;  Service: Cardiovascular;  Laterality: N/A;   TONSILLECTOMY     Family History  Problem Relation Age of Onset   Dementia Mother        memory loss   Heart failure Mother    Stroke Mother    Heart attack Father 12   Dementia Brother        suspected Lewy body   Social History   Socioeconomic History   Marital status: Married    Spouse name: Thurston Hole   Number of children: 3   Years of education: 15   Highest education level: Some college, no degree  Occupational History   Occupation: Retired    Comment: IT trainer  Tobacco Use   Smoking status: Former    Current packs/day: 0.00    Average packs/day: 0.5 packs/day for 10.0 years (5.0 ttl pk-yrs)    Types: Cigarettes    Start date: 02/04/1962    Quit date: 02/05/1972    Years since quitting: 51.2   Smokeless tobacco: Never  Vaping Use   Vaping status: Never Used  Substance and Sexual Activity   Alcohol use: Not Currently    Comment: rare   Drug use: No   Sexual activity: Not on file  Other Topics Concern   Not on file  Social History Narrative   Married, lives with wife in a one story home. [facility])  Pt is right handed. He drinks hot tea 5x week.   Social Determinants of Health   Financial Resource Strain: Not on file  Food Insecurity: Not on file   Transportation Needs: Not on file  Physical Activity: Not on file  Stress: Not on file  Social Connections: Unknown (11/19/2021)   Received from [facility], [facility]   Social Network    Social Network: Not on file    Tobacco Counseling Counseling given: Not Answered   Clinical Intake:  Pre-visit preparation completed: No  Pain : No/denies pain     BMI - recorded: 23.75 Nutritional Status: BMI of 19-24  Normal Nutritional Risks: None Diabetes: Yes CBG done?: Yes CBG resulted in Enter/ Edit results?: No Did pt. bring in  CBG monitor from home?: No  How often do you need to have someone help you when you read instructions, pamphlets, or other written materials from your doctor or pharmacy?: 3 - Sometimes What is the last grade level you completed in school?: College  Interpreter Needed?: No  Information entered by :: Fletcher Anon NP   Activities of Daily Living    04/09/2023    3:51 PM  In your present state of health, do you have any difficulty performing the following activities:  Hearing? 0  Vision? 1  Difficulty concentrating or making decisions? 1  Walking or climbing stairs? 1  Dressing or bathing? 1  Doing errands, shopping? 1  Preparing Food and eating ? Y  In the past six months, have you accidently leaked urine? Y  Do you have problems with loss of bowel control? N  Managing your Medications? Y  Managing your Finances? Y  Housekeeping or managing your Housekeeping? Y    Patient Care Team: Mahlon Gammon, MD as PCP - General (Internal Medicine) Kathleene Hazel, MD as PCP - Cardiology (Cardiology) Drema Dallas, DO as Consulting Physician (Neurology)  Indicate any recent Medical Services you may have received from other than [facility] providers in the past year (date may be approximate).     Assessment:   This is a routine wellness examination for Talan.  Hearing/Vision screen No results found.   Goals Addressed   None     Depression Screen    10/07/2022   10:32 AM 08/12/2022    2:44 PM  PHQ 2/9 Scores  PHQ - 2 Score 0 0    Fall Risk    03/31/2023    1:05 PM 10/07/2022   10:32 AM 09/23/2022    1:07 PM 08/12/2022    2:44 PM 03/25/2022    2:29 PM  Fall Risk   Falls in the past year? 1 1 0 1 0  Number falls in past yr: 1 0 0 1 0  Injury with Fall? 1 0 0 0 0  Risk for fall due to :  History of fall(s)  History of fall(s);Impaired mobility;Impaired balance/gait   Follow up Falls evaluation completed Falls evaluation completed Falls evaluation completed      MEDICARE RISK AT HOME:    TIMED UP AND GO:  Was the test performed?  No    Cognitive Function:    04/09/2023    3:16 PM 09/23/2022    4:00 PM  MMSE - Mini Mental State Exam  Orientation to time 0 4  Orientation to Place 3 4  Registration 3 3  Attention/ Calculation 2 5  Recall 0 0  Language- name 2 objects 1 2  Language- repeat 1 1  Language- follow 3 step command 3 3  Language- read & follow direction 1 1  Write a sentence 1 0  Copy design 0 0  Total score 15 23      12/15/2021   10:00 AM  Montreal Cognitive Assessment   Visuospatial/ Executive (0/5) 1  Naming (0/3) 2  Attention: Read list of digits (0/2) 1  Attention: Read list of letters (0/1) 1  Attention: Serial 7 subtraction starting at 100 (0/3) 3  Language: Repeat phrase (0/2) 2  Language : Fluency (0/1) 0  Abstraction (0/2) 1  Delayed Recall (0/5) 1  Orientation (0/6) 3  Total 15  Adjusted Score (based on education) 15      Immunizations Immunization History  Administered Date(s) Administered   Fluad Quad(high  Dose 65+) 04/08/2022   Influenza Split 05/01/2010, 03/11/2011, 04/06/2012, 04/21/2013, 05/03/2021   Influenza Whole 03/07/2009   Influenza, High Dose Seasonal PF 03/29/2014, 04/02/2015, 03/31/2016, 04/08/2017, 04/16/2018   Influenza-Unspecified 04/16/2019, 03/29/2020   Moderna Covid-19 Fall Seasonal Vaccine 87yrs & older 05/07/2022   Moderna Sars-Covid-2  Vaccination 07/19/2019, 08/17/2019, 06/01/2020, 12/09/2021   Pneumococcal Conjugate-13 03/29/2014   Pneumococcal Polysaccharide-23 10/31/2003, 09/07/2006, 03/25/2013   Td (Adult) 10/30/1993   Tdap 07/07/2010, 03/19/2011   Zoster, Live 10/30/2005, 10/21/2016, 12/24/2016   Zoster, Unspecified 10/24/2016    TDAP status: Due, Education has been provided regarding the importance of this vaccine. Advised may receive this vaccine at local pharmacy or Health Dept. Aware to provide a copy of the vaccination record if obtained from local pharmacy or Health Dept. Verbalized acceptance and understanding.  Flu Vaccine status: Due, Education has been provided regarding the importance of this vaccine. Advised may receive this vaccine at local pharmacy or Health Dept. Aware to provide a copy of the vaccination record if obtained from local pharmacy or Health Dept. Verbalized acceptance and understanding.  Pneumococcal vaccine status: Due, Education has been provided regarding the importance of this vaccine. Advised may receive this vaccine at local pharmacy or Health Dept. Aware to provide a copy of the vaccination record if obtained from local pharmacy or Health Dept. Verbalized acceptance and understanding.  Covid-19 vaccine status: Information provided on how to obtain vaccines.   Qualifies for Shingles Vaccine? Yes   Zostavax completed Yes   Shingrix Completed?: Yes  Screening Tests Health Maintenance  Topic Date Due   Diabetic kidney evaluation - Urine ACR  Never done   Zoster Vaccines- Shingrix (1 of 2) 08/22/1954   DTaP/Tdap/Td (3 - Td or Tdap) 03/18/2021   Medicare Annual Wellness (AWV)  12/07/2022   INFLUENZA VACCINE  02/05/2023   COVID-19 Vaccine (6 - 2023-24 season) 03/08/2023   OPHTHALMOLOGY EXAM  07/30/2023   HEMOGLOBIN A1C  08/27/2023   FOOT EXAM  10/07/2023   Diabetic kidney evaluation - eGFR measurement  02/24/2024   Pneumonia Vaccine 34+ Years old  Completed   HPV VACCINES  Aged  Out    Health Maintenance  Health Maintenance Due  Topic Date Due   Diabetic kidney evaluation - Urine ACR  Never done   Zoster Vaccines- Shingrix (1 of 2) 08[birth year]22/1956   DTaP/Tdap/Td (3 - Td or Tdap) 03/18/2021   Medicare Annual Wellness (AWV)  12/07/2022   INFLUENZA VACCINE  02/05/2023   COVID-19 Vaccine (6 - 2023-24 season) 03/08/2023    Colorectal cancer screening: No longer required.   Lung Cancer Screening: (Low Dose CT Chest recommended if Age 26-80 years, 20 pack-year currently smoking OR have quit w/in 15years.) does not qualify.   Lung Cancer Screening Referral: na  Additional Screening:  Hepatitis C Screening: does not qualify; Completed na  Vision Screening: Recommended annual ophthalmology exams for early detection of glaucoma and other disorders of the eye. Is the patient up to date with their annual eye exam?  Yes  Who is the provider or what is the name of the office in which the patient attends annual eye exams? Need provider name If pt is not established with a provider, would they like to be referred to a provider to establish care? No .   Dental Screening: Recommended annual dental exams for proper oral hygiene  Diabetic Foot Exam: Diabetic Foot Exam: Completed 10/07/22  Community Resource Referral / Chronic Care Management: CRR required this visit?  No   CCM required  this visit?  No     Plan:     I have personally reviewed and noted the following in the patient's chart:   Medical and social history Use of alcohol, tobacco or illicit drugs  Current medications and supplements including opioid prescriptions. Patient is not currently taking opioid prescriptions. Functional ability and status Nutritional status Physical activity Advanced directives List of other physicians Hospitalizations, surgeries, and ER visits in previous 12 months Vitals Screenings to include cognitive, depression, and falls Referrals and appointments  In addition, I have  reviewed and discussed with patient certain preventive protocols, quality metrics, and best practice recommendations. A written personalized care plan for preventive services as well as general preventive health recommendations were provided to patient.     Fletcher Anon, NP   04/09/2023   After Visit Summary: [facility] to [facility]  Nurse Notes: NA

## 2023-04-20 ENCOUNTER — Encounter: Payer: Self-pay | Admitting: Internal Medicine

## 2023-04-20 ENCOUNTER — Non-Acute Institutional Stay (SKILLED_NURSING_FACILITY): Payer: Self-pay | Admitting: Internal Medicine

## 2023-04-20 DIAGNOSIS — I10 Essential (primary) hypertension: Secondary | ICD-10-CM

## 2023-04-20 DIAGNOSIS — E113293 Type 2 diabetes mellitus with mild nonproliferative diabetic retinopathy without macular edema, bilateral: Secondary | ICD-10-CM | POA: Diagnosis not present

## 2023-04-20 DIAGNOSIS — E785 Hyperlipidemia, unspecified: Secondary | ICD-10-CM

## 2023-04-20 DIAGNOSIS — G309 Alzheimer's disease, unspecified: Secondary | ICD-10-CM | POA: Diagnosis not present

## 2023-04-20 DIAGNOSIS — I2510 Atherosclerotic heart disease of native coronary artery without angina pectoris: Secondary | ICD-10-CM

## 2023-04-20 DIAGNOSIS — R2681 Unsteadiness on feet: Secondary | ICD-10-CM

## 2023-04-20 DIAGNOSIS — F02818 Dementia in other diseases classified elsewhere, unspecified severity, with other behavioral disturbance: Secondary | ICD-10-CM

## 2023-04-20 DIAGNOSIS — F5101 Primary insomnia: Secondary | ICD-10-CM

## 2023-04-20 DIAGNOSIS — Z794 Long term (current) use of insulin: Secondary | ICD-10-CM

## 2023-04-20 NOTE — Progress Notes (Signed)
Location:  [facility] Room Number: 309A Place of Service:  SNF [phone]) Provider: Mahlon Gammon, MD   Mahlon Gammon, MD  Patient Care Team: Mahlon Gammon, MD as PCP - General (Internal Medicine) Kathleene Hazel, MD as PCP - Cardiology (Cardiology) Drema Dallas, DO as Consulting Physician (Neurology)  Extended Emergency Contact Information Primary Emergency Contact: [facility] Address: [address]          [place], [place] [address] [place] Home Phone: [phone] Mobile Phone: [phone] Relation: Daughter Secondary Emergency Contact: [facility] Phone: [phone] Relation: Spouse Preferred language: English Interpreter needed? No  Code Status:  DNR Goals of care: Advanced Directive information    04/20/2023    4:07 PM  Advanced Directives  Does Patient Have a Medical Advance Directive? Yes  Type of Estate agent of [place];Living will;Out of facility DNR (pink MOST or yellow form)  Does patient want to make changes to medical advance directive? No - Patient declined  Copy of Healthcare Power of Attorney in Chart? Yes - validated most recent copy scanned in chart (See row information)  Pre-existing out of facility DNR order (yellow form or pink MOST form) Yellow form placed in chart (order not valid for inpatient use)     Chief Complaint  Patient presents with   Medical Management of Chronic Issues    Patient is being seen for a routine visit     HPI:  Pt is a 87 y.o. male seen today for medical management of chronic diseases.    Lives in Memory Unit in [place] Wife there also in different rooms  Recent admission to Memory unit with his Wife  in [place]   Diabetes Type 2  Alzheimer Dementia MRI shows Advanced Atrophy Last MMSE 23/30 per Neurology  On Namenda and Exelon.   Urinary and Bowel Incontinence  CAD S/P Stent placement No Active symptoms HLD,Gait  Instability  Peripheral Neuropathy, Anemia, OSA falied CPAP  Patient for past few days again got agitated at night and was bothering his wife He gets agitated at night sometimes Already on Remeron , and Vistaril  This morning he was pleasantly  confused thsi morning Using his Wheelchair to get around   Past Medical History:  Diagnosis Date   CAD (coronary artery disease)    Bare meta stent RCA   Carotid stenosis    a. Carotid US 3/17: bilateral ICA 1-39% >> repeat 2 years   Coronary atherosclerosis of native coronary artery 08/24/2008   S/P PCI   Dementia 12/15/2021   concerns for Alzheimer's disease   Essential hypertension, benign 08/24/2008   Fatigue 08/08/2010   History of nuclear stress test    Myoview 12/16: EF 66%, very mild inferior ischemia; Low Risk   Mixed hyperlipidemia 08/24/2008   Myocardial infarction 10/06/2007   Nonproliferative diabetic retinopathy 08/08/2011   Nuclear sclerosis of both eyes 12/01/2017   Peripheral neuropathy    Pulmonary nodule, right 09/13/2007   Right upper lobe         Sinoatrial node dysfunction 08/24/2008   Chronotropic incompetence   Snoring 09/27/2009   Type II diabetes mellitus 08/08/2011   Past Surgical History:  Procedure Laterality Date   LEFT HEART CATHETERIZATION WITH CORONARY ANGIOGRAM N/A 03/04/2012   Procedure: LEFT HEART CATHETERIZATION WITH CORONARY ANGIOGRAM;  Surgeon: Kathleene Hazel, MD;  Location: [facility] CATH LAB;  Service: Cardiovascular;  Laterality: N/A;   TONSILLECTOMY      Allergies  Allergen  Reactions   Ace Inhibitors    Alpha Lipoic Acid [Lipoic Acid]     Upset Stomach   Invokana [Canagliflozin]    Heparin Hives   Isosorbide Mononitrate [Isosorbide Dinitrate Er] Hives   Penicillins Hives   Sulfonamide Derivatives Hives    Outpatient Encounter Medications as of 04/20/2023  Medication Sig   amLODipine (NORVASC) 10 MG tablet TAKE 1 TABLET ONCE DAILY.   aspirin 81 MG tablet Take 1 tablet (81 mg  total) by mouth daily.   atorvastatin (LIPITOR) 10 MG tablet Take 10 mg by mouth daily.   citalopram (CELEXA) 20 MG tablet Take 20 mg by mouth daily.   Cyanocobalamin (VITAMIN B 12) 500 MCG TABS Take 2 tablets by mouth every other day.   divalproex (DEPAKOTE ER) 250 MG 24 hr tablet Take 500 mg by mouth at bedtime.   famotidine (PEPCID) 20 MG tablet Take 20 mg by mouth 2 (two) times daily.   ferrous sulfate 325 (65 FE) MG EC tablet Take 325 mg by mouth 3 (three) times a week.   hydrochlorothiazide (HYDRODIURIL) 25 MG tablet TAKE 1 TABLET ONCE DAILY.   hydrOXYzine (VISTARIL) 25 MG capsule Take 1 capsule (25 mg total) by mouth 2 (two) times daily as needed (increased agitation/ sundowning).   hydrOXYzine (VISTARIL) 25 MG capsule Take 25 mg by mouth at bedtime.   insulin aspart (NOVOLOG) 100 UNIT/ML injection Inject 10 Units into the skin once. With lunch and Dinner Only if CBGS more then 200   LANTUS SOLOSTAR 100 UNIT/ML injection Inject 38 Units into the skin every morning.   latanoprost (XALATAN) 0.005 % ophthalmic solution Place 1 drop into both eyes at bedtime.   memantine (NAMENDA) 10 MG tablet TAKE ONE TABLET BY MOUTH EVERY MORNING and TAKE ONE TABLET BY MOUTH EVERYDAY AT BEDTIME   metformin (FORTAMET) 500 MG (OSM) 24 hr tablet Take 500 mg by mouth 2 (two) times daily with a meal.   mirtazapine (REMERON SOL-TAB) 15 MG disintegrating tablet Take 1 tablet (15 mg total) by mouth at bedtime.   rivastigmine (EXELON) 1.5 MG capsule TAKE ONE CAPSULE BY MOUTH TWICE DAILY   spironolactone (ALDACTONE) 25 MG tablet Take 25 mg by mouth daily.   No facility-administered encounter medications on file as of 04/20/2023.    Review of Systems  Unable to perform ROS: Dementia    Immunization History  Administered Date(s) Administered   Fluad Quad(high Dose 65+) 04/08/2022   Influenza Split 05/01/2010, 03/11/2011, 04/06/2012, 04/21/2013, 05/03/2021   Influenza Whole 03/07/2009   Influenza, High Dose  Seasonal PF 03/29/2014, 04/02/2015, 03/31/2016, 04/08/2017, 04/16/2018   Influenza-Unspecified 04/16/2019, 03/29/2020   Moderna Covid-19 Fall Seasonal Vaccine 91yrs & older 05/07/2022   Moderna Sars-Covid-2 Vaccination 07/19/2019, 08/17/2019, 06/01/2020, 12/09/2021   Pneumococcal Conjugate-13 03/29/2014   Pneumococcal Polysaccharide-23 10/31/2003, 09/07/2006, 03/25/2013   Td (Adult) 10/30/1993   Tdap 07/07/2010, 03/19/2011   Zoster Recombinant(Shingrix) 10/24/2016, 12/24/2016   Zoster, Live 10/30/2005, 10/21/2016, 12/24/2016   Zoster, Unspecified 10/24/2016   Pertinent  Health Maintenance Due  Topic Date Due   INFLUENZA VACCINE  02/05/2023   OPHTHALMOLOGY EXAM  07/30/2023   HEMOGLOBIN A1C  08/27/2023   FOOT EXAM  10/07/2023      09/23/2022    1:07 PM 10/07/2022   10:32 AM 03/31/2023    1:05 PM 04/09/2023    4:03 PM 04/20/2023    4:07 PM  Fall Risk  Falls in the past year? 0 1 1 1  0  Was there an injury with Fall? 0  0 1 1 0  Fall Risk Category Calculator 0 1 3 2  0  Patient at Risk for Falls Due to  History of fall(s)  History of fall(s) No Fall Risks  Fall risk Follow up Falls evaluation completed Falls evaluation completed Falls evaluation completed Falls evaluation completed Falls evaluation completed   Functional Status Survey:    Vitals:   04/20/23 1352  BP: (!) 156/80  Pulse: 66  Resp: 18  Temp: 97.7 F (36.5 C)  TempSrc: Temporal  SpO2: 99%  Height: 6\' 2"  (1.88 m)   Body mass index is 23.75 kg/m. Physical Exam Vitals reviewed.  Constitutional:      Appearance: Normal appearance.  HENT:     Head: Normocephalic.     Nose: Nose normal.     Mouth/Throat:     Mouth: Mucous membranes are moist.     Pharynx: Oropharynx is clear.  Eyes:     Pupils: Pupils are equal, round, and reactive to light.  Cardiovascular:     Rate and Rhythm: Normal rate and regular rhythm.     Pulses: Normal pulses.     Heart sounds: No murmur heard. Pulmonary:     Effort: Pulmonary  effort is normal. No respiratory distress.     Breath sounds: Normal breath sounds. No rales.  Abdominal:     General: Abdomen is flat. Bowel sounds are normal.     Palpations: Abdomen is soft.  Musculoskeletal:        General: No swelling.     Cervical back: Neck supple.  Skin:    General: Skin is warm.  Neurological:     General: No focal deficit present.     Mental Status: He is alert.  Psychiatric:        Mood and Affect: Mood normal.        Thought Content: Thought content normal.     Labs reviewed: Recent Labs    08/19/22 0000 01/14/23 0410 02/24/23 0000  NA 138 140 142  K 4.5 4.3 4.5  CL 101 107 106  CO2  --  23 24*  GLUCOSE  --  93  --   BUN 27* 30* 33*  CREATININE 1.3 1.61* 1.4*  CALCIUM 9.1 9.1 9.0   Recent Labs    08/19/22 0000 02/24/23 0000  AST 27 32  ALT 22 25  ALKPHOS  --  104  ALBUMIN 4.0 3.9   Recent Labs    08/19/22 0000 01/14/23 0410 02/24/23 0000  WBC 6.0 8.5 5.7  NEUTROABS  --  6.7  --   HGB 12.5* 12.1* 11.9*  HCT 37* 35.9* 36*  MCV  --  94.7  --   PLT 261 217 294   Lab Results  Component Value Date   TSH 2.58 08/19/2022   Lab Results  Component Value Date   HGBA1C 7.7 02/24/2023   Lab Results  Component Value Date   CHOL 136 08/19/2022   HDL 37 08/19/2022   LDLCALC 80 08/19/2022   TRIG 96 08/19/2022   CHOLHDL 3.0 09/04/2007    Significant Diagnostic Results in last 30 days:  No results found.  Assessment/Plan 1. Alzheimer's dementia with behavioral disturbance (HCC) Namenda and Exelon Still has issues of staying up at night Bothers his Wife who is doing better now with behaviors On Depakote and Vistaril For Sundowning Will Make Referral to Dr Donell Beers   2. Primary insomnia Remeron and Vistaril Not Effective at this time  3. Type 2 diabetes mellitus with mild nonproliferative  retinopathy of both eyes, with long-term current use of insulin, macular edema presence unspecified (HCC) A1c 8.5 in 8/24 CBGS in  Mornings are little low Stay Around 80s which are fine Other CBGS vary from 150-300 He gets Coverage for those 4. Essential hypertension, benign Norvasc and Spironolactone  5. Coronary artery disease involving native coronary artery of native heart without angina pectoris Aspirin and statin LDL 80  6. Hyperlipidemia, unspecified hyperlipidemia type On statin  7. Gait instability Uses Wheelchair now 8 Depression On Celexa 9 LE edema On hydrochlorothiazide and Aldactone  10 Anemia HGB stable on iron   Family/ staff Communication:   Labs/tests ordered:

## 2023-05-07 ENCOUNTER — Other Ambulatory Visit: Payer: Self-pay | Admitting: Adult Health

## 2023-05-07 MED ORDER — BELSOMRA 10 MG PO TABS: 10.0000 mg | ORAL_TABLET | Freq: Every day | ORAL | 1 refills | Status: DC

## 2023-05-11 ENCOUNTER — Encounter: Payer: Self-pay | Admitting: Adult Health

## 2023-05-11 ENCOUNTER — Non-Acute Institutional Stay (SKILLED_NURSING_FACILITY): Payer: Self-pay | Admitting: Adult Health

## 2023-05-11 DIAGNOSIS — Z794 Long term (current) use of insulin: Secondary | ICD-10-CM | POA: Diagnosis not present

## 2023-05-11 DIAGNOSIS — R5383 Other fatigue: Secondary | ICD-10-CM

## 2023-05-11 DIAGNOSIS — R051 Acute cough: Secondary | ICD-10-CM

## 2023-05-11 DIAGNOSIS — E113293 Type 2 diabetes mellitus with mild nonproliferative diabetic retinopathy without macular edema, bilateral: Secondary | ICD-10-CM | POA: Diagnosis not present

## 2023-05-11 MED ORDER — DIVALPROEX SODIUM ER 250 MG PO TB24: 750.0000 mg | ORAL_TABLET | Freq: Every day | ORAL | Status: DC

## 2023-05-11 NOTE — Progress Notes (Unsigned)
Location:  Medical illustrator of Service:  SNF (31) Provider:  Tamsen Roers, MD  Patient Care Team: Mahlon Gammon, MD as PCP - General (Internal Medicine) Kathleene Hazel, MD as PCP - Cardiology (Cardiology) Drema Dallas, DO as Consulting Physician (Neurology)  Extended Emergency Contact Information Primary Emergency Contact: [facility] Address: [address]          [place], [place] [address] Darden Amber of [place] Home Phone: [phone] Mobile Phone: [phone] Relation: Daughter Secondary Emergency Contact: [facility] Phone: [phone] Relation: Spouse Preferred language: English Interpreter needed? No  Code Status:  DNR Goals of care: Advanced Directive information    05/11/2023    9:11 AM  Advanced Directives  Does Patient Have a Medical Advance Directive? Yes  Type of Estate agent of [place];Living will;Out of facility DNR (pink MOST or yellow form)  Does patient want to make changes to medical advance directive? No - Patient declined  Copy of Healthcare Power of Attorney in Chart? Yes - validated most recent copy scanned in chart (See row information)  Pre-existing out of facility DNR order (yellow form or pink MOST form) Yellow form placed in chart (order not valid for inpatient use)     Chief Complaint  Patient presents with  . Acute Visit    Patient is being seen for a cough     HPI:  Pt is a 88 y.o. male seen today for an acute visit for cough and lethargy This am he slept in which is unusual. He was difficult to arouse and could not take pills or meds.  He also has a congested cough present for several days per nurse. No sputum production, no low 02 sats or fever. Due to dementia the patient is a poor historian. CXR obtained showing bronchitis changes possibly due to inflammation  Rapid covid swab negative.   He has had behaviors of aggression and  insomnia. Two weeks ago depakote was increased. Also Belsomra started. Depakote level 42 05/06/23, ALT 14, AST 20.   CBG this am 71. Other readings range 92-200.    Past Medical History:  Diagnosis Date  . CAD (coronary artery disease)    Bare meta stent RCA  . Carotid stenosis    a. Carotid US 3/17: bilateral ICA 1-39% >> repeat 2 years  . Coronary atherosclerosis of native coronary artery 08/24/2008   S/P PCI  . Dementia 12/15/2021   concerns for Alzheimer's disease  . Essential hypertension, benign 08/24/2008  . Fatigue 08/08/2010  . History of nuclear stress test    Myoview 12/16: EF 66%, very mild inferior ischemia; Low Risk  . Mixed hyperlipidemia 08/24/2008  . Myocardial infarction 10/06/2007  . Nonproliferative diabetic retinopathy 08/08/2011  . Nuclear sclerosis of both eyes 12/01/2017  . Peripheral neuropathy   . Pulmonary nodule, right 09/13/2007   Right upper lobe        . Sinoatrial node dysfunction 08/24/2008   Chronotropic incompetence  . Snoring 09/27/2009  . Type II diabetes mellitus 08/08/2011   Past Surgical History:  Procedure Laterality Date  . LEFT HEART CATHETERIZATION WITH CORONARY ANGIOGRAM N/A 03/04/2012   Procedure: LEFT HEART CATHETERIZATION WITH CORONARY ANGIOGRAM;  Surgeon: Kathleene Hazel, MD;  Location: [facility] CATH LAB;  Service: Cardiovascular;  Laterality: N/A;  . TONSILLECTOMY      Allergies  Allergen Reactions  . Ace Inhibitors   . Alpha Lipoic Acid [Lipoic Acid]     Upset Stomach  .  Invokana [Canagliflozin]   . Heparin Hives  . Isosorbide Mononitrate [Isosorbide Dinitrate Er] Hives  . Penicillins Hives  . Sulfonamide Derivatives Hives    Outpatient Encounter Medications as of 05/11/2023  Medication Sig  . amLODipine (NORVASC) 10 MG tablet TAKE 1 TABLET ONCE DAILY.  Marland Kitchen aspirin 81 MG tablet Take 1 tablet (81 mg total) by mouth daily.  Marland Kitchen atorvastatin (LIPITOR) 10 MG tablet Take 10 mg by mouth daily.  . citalopram (CELEXA) 20 MG  tablet Take 20 mg by mouth daily.  . Cyanocobalamin (VITAMIN B 12) 500 MCG TABS Take 2 tablets by mouth every other day.  Marland Kitchen dextromethorphan-guaiFENesin (ROBITUSSIN-DM) 10-100 MG/5ML liquid Take by mouth every 6 (six) hours as needed for cough.  . divalproex (DEPAKOTE ER) 500 MG 24 hr tablet Take 500 mg by mouth daily. Take 1.5 tablets at bedtime =. 750mg  total  . famotidine (PEPCID) 20 MG tablet Take 20 mg by mouth 2 (two) times daily.  . ferrous sulfate 325 (65 FE) MG EC tablet Take 325 mg by mouth 3 (three) times a week.  . hydrochlorothiazide (HYDRODIURIL) 25 MG tablet TAKE 1 TABLET ONCE DAILY.  . hydrOXYzine (VISTARIL) 25 MG capsule Take 1 capsule (25 mg total) by mouth 2 (two) times daily as needed (increased agitation/ sundowning).  . hydrOXYzine (VISTARIL) 25 MG capsule Take 25 mg by mouth at bedtime.  . insulin aspart (NOVOLOG) 100 UNIT/ML injection Inject 10 Units into the skin once. With lunch and Dinner Only if CBGS more then 200  . LANTUS SOLOSTAR 100 UNIT/ML injection Inject 38 Units into the skin every morning.  . latanoprost (XALATAN) 0.005 % ophthalmic solution Place 1 drop into both eyes at bedtime.  . memantine (NAMENDA) 10 MG tablet TAKE ONE TABLET BY MOUTH EVERY MORNING and TAKE ONE TABLET BY MOUTH EVERYDAY AT BEDTIME  . metformin (FORTAMET) 500 MG (OSM) 24 hr tablet Take 500 mg by mouth 2 (two) times daily with a meal.  . mirtazapine (REMERON SOL-TAB) 15 MG disintegrating tablet Take 1 tablet (15 mg total) by mouth at bedtime.  . rivastigmine (EXELON) 1.5 MG capsule TAKE ONE CAPSULE BY MOUTH TWICE DAILY  . spironolactone (ALDACTONE) 25 MG tablet Take 25 mg by mouth daily.  . Suvorexant (BELSOMRA) 10 MG TABS Take 1 tablet (10 mg total) by mouth at bedtime.  . [DISCONTINUED] divalproex (DEPAKOTE ER) 250 MG 24 hr tablet Take 500 mg by mouth at bedtime.   No facility-administered encounter medications on file as of 05/11/2023.    Review of Systems  Unable to perform ROS:  Dementia    Immunization History  Administered Date(s) Administered  . Fluad Quad(high Dose 65+) 04/08/2022, 04/28/2023  . Influenza Split 05/01/2010, 03/11/2011, 04/06/2012, 04/21/2013, 05/03/2021  . Influenza Whole 03/07/2009  . Influenza, High Dose Seasonal PF 03/29/2014, 04/02/2015, 03/31/2016, 04/08/2017, 04/16/2018  . Influenza-Unspecified 04/16/2019, 03/29/2020  . Moderna Covid-19 Fall Seasonal Vaccine 19yrs & older 05/07/2022  . Moderna Covid-19 Vaccine Bivalent Booster 78yrs & up 04/28/2023  . Moderna Sars-Covid-2 Vaccination 07/19/2019, 08/17/2019, 06/01/2020, 12/09/2021  . Pneumococcal Conjugate-13 03/29/2014  . Pneumococcal Polysaccharide-23 10/31/2003, 09/07/2006, 03/25/2013  . Td (Adult) 10/30/1993  . Tdap 07/07/2010, 03/19/2011, 04/16/2023  . Zoster Recombinant(Shingrix) 10/24/2016, 12/24/2016  . Zoster, Live 10/30/2005, 10/21/2016, 12/24/2016  . Zoster, Unspecified 10/24/2016   Pertinent  Health Maintenance Due  Topic Date Due  . OPHTHALMOLOGY EXAM  07/30/2023  . HEMOGLOBIN A1C  08/27/2023  . FOOT EXAM  10/07/2023  . INFLUENZA VACCINE  Completed  09/23/2022    1:07 PM 10/07/2022   10:32 AM 03/31/2023    1:05 PM 04/09/2023    4:03 PM 04/20/2023    4:07 PM  Fall Risk  Falls in the past year? 0 1 1 1  0  Was there an injury with Fall? 0 0 1 1 0  Fall Risk Category Calculator 0 1 3 2  0  Patient at Risk for Falls Due to  History of fall(s)  History of fall(s) No Fall Risks  Fall risk Follow up Falls evaluation completed Falls evaluation completed Falls evaluation completed Falls evaluation completed Falls evaluation completed   Functional Status Survey:    Vitals:   05/11/23 0913  BP: (!) 142/82  Pulse: 60  Resp: 18  Temp: 97.6 F (36.4 C)  TempSrc: Temporal  SpO2: 94%  Weight: 181 lb 12.8 oz (82.5 kg)  Height: 6\' 2"  (1.88 m)   Body mass index is 23.34 kg/m. Physical Exam Vitals and nursing note reviewed.  Constitutional:      General: He is not  in acute distress.    Appearance: He is not diaphoretic.     Comments: lethargic  HENT:     Head: Normocephalic and atraumatic.     Nose: Nose normal.     Mouth/Throat:     Comments: refused Eyes:     Conjunctiva/sclera: Conjunctivae normal.     Pupils: Pupils are equal, round, and reactive to light.  Neck:     Thyroid: No thyromegaly.     Vascular: No JVD.     Trachea: No tracheal deviation.  Cardiovascular:     Rate and Rhythm: Normal rate and regular rhythm.     Heart sounds: No murmur heard. Pulmonary:     Effort: Pulmonary effort is normal. No respiratory distress.     Breath sounds: Normal breath sounds. No wheezing.  Abdominal:     General: Bowel sounds are normal. There is no distension.     Palpations: Abdomen is soft.     Tenderness: There is no abdominal tenderness.  Lymphadenopathy:     Cervical: No cervical adenopathy.  Skin:    General: Skin is warm and dry.  Neurological:     Comments: Arouses to verbal and physical stim but does not follow commands  No obvious focal deficit.    Labs reviewed: Recent Labs    08/19/22 0000 01/14/23 0410 02/24/23 0000  NA 138 140 142  K 4.5 4.3 4.5  CL 101 107 106  CO2  --  23 24*  GLUCOSE  --  93  --   BUN 27* 30* 33*  CREATININE 1.3 1.61* 1.4*  CALCIUM 9.1 9.1 9.0   Recent Labs    08/19/22 0000 02/24/23 0000  AST 27 32  ALT 22 25  ALKPHOS  --  104  ALBUMIN 4.0 3.9   Recent Labs    08/19/22 0000 01/14/23 0410 02/24/23 0000  WBC 6.0 8.5 5.7  NEUTROABS  --  6.7  --   HGB 12.5* 12.1* 11.9*  HCT 37* 35.9* 36*  MCV  --  94.7  --   PLT 261 217 294   Lab Results  Component Value Date   TSH 2.58 08/19/2022   Lab Results  Component Value Date   HGBA1C 7.7 02/24/2023   Lab Results  Component Value Date   CHOL 136 08/19/2022   HDL 37 08/19/2022   LDLCALC 80 08/19/2022   TRIG 96 08/19/2022   CHOLHDL 3.0 09/04/2007    Significant Diagnostic  Results in last 30 days:  No results  found.  Assessment/Plan  1. Lethargy Likely due to med changes and underlying viral illness.  Hold belsomra If not improving consider reducing vistaril or depakote. Consult with Dr Donell Beers   2. Acute cough No pna Likely bronchitis, if not improving consider prednisone Continue Diabeta tussin.  3. Type 2 DM II Hold insulin if not able to eat and notify me.   Family/ staff Communication: nurse and daughter Hilda Lias at bedside  Labs/tests ordered:  CXR, covid swab

## 2023-05-22 ENCOUNTER — Encounter: Payer: Self-pay | Admitting: Adult Health

## 2023-05-22 ENCOUNTER — Non-Acute Institutional Stay (SKILLED_NURSING_FACILITY): Payer: PPO | Admitting: Adult Health

## 2023-05-22 DIAGNOSIS — F02818 Dementia in other diseases classified elsewhere, unspecified severity, with other behavioral disturbance: Secondary | ICD-10-CM

## 2023-05-22 DIAGNOSIS — R5383 Other fatigue: Secondary | ICD-10-CM

## 2023-05-22 DIAGNOSIS — G309 Alzheimer's disease, unspecified: Secondary | ICD-10-CM | POA: Diagnosis not present

## 2023-05-22 DIAGNOSIS — I10 Essential (primary) hypertension: Secondary | ICD-10-CM | POA: Diagnosis not present

## 2023-05-22 MED ORDER — BELSOMRA 10 MG PO TABS: 10.0000 mg | ORAL_TABLET | Freq: Every evening | ORAL | Status: DC | PRN

## 2023-05-22 NOTE — Addendum Note (Signed)
Addended by: Esmond Camper on: 05/22/2023 11:44 AM   Modules accepted: Orders

## 2023-05-22 NOTE — Progress Notes (Signed)
Location:  [facility] Room Number: 309A Place of Service:  SNF [phone]) Provider:  Tamsen Roers, MD  Patient Care Team: Mahlon Gammon, MD as PCP - General (Internal Medicine) Kathleene Hazel, MD as PCP - Cardiology (Cardiology) Drema Dallas, DO as Consulting Physician (Neurology)  Extended Emergency Contact Information Primary Emergency Contact: [facility] Address: [address]          [place], [place] [address] Darden Amber of [place] Home Phone: [phone] Mobile Phone: [phone] Relation: Daughter Secondary Emergency Contact: [facility] Phone: [phone] Relation: Spouse Preferred language: English Interpreter needed? No  Code Status:  DNR Goals of care: Advanced Directive information    05/22/2023   10:58 AM  Advanced Directives  Does Patient Have a Medical Advance Directive? Yes  Type of Estate agent of [place];Living will;Out of facility DNR (pink MOST or yellow form)  Does patient want to make changes to medical advance directive? No - Patient declined  Copy of Healthcare Power of Attorney in Chart? Yes - validated most recent copy scanned in chart (See row information)  Pre-existing out of facility DNR order (yellow form or pink MOST form) Yellow form placed in chart (order not valid for inpatient use)     Chief Complaint  Patient presents with   Acute Visit    Patient is being seen for f/u lethargy    HPI:  Pt is a 87 y.o. male seen today for an acute visit for follow up regarding lethargy.   PMH: dementia, MI, HTN, CAD, carotid stenosis, sleep apnea, type 2 DM, neuropathy, CKD, HLD, depression.   He was seen on 05/11/23 due to lethargy and cough. The nurse could not arouse him. Rapid covid and flu were negative. CXR showed some bronchitis but no pna. CBC and CMP were ordered and were WNL. Belsomra was held  Since then the Belsomra has been made  prn. It is not being used regularly. His behaviors have improved. He can be easily angered at times but is now calmer on his current regimen. He has been evaluated by Dr Donell Beers and is on Remeron, Vistaril, Exelon, Celexa, and Depakote.  His weight is trending upward to 190lbs. Intakes are adequate.   He has very minimal cough left. No sob. No fever and normal 02 sats.    Past Medical History:  Diagnosis Date   CAD (coronary artery disease)    Bare meta stent RCA   Carotid stenosis    a. Carotid US 3/17: bilateral ICA 1-39% >> repeat 2 years   Coronary atherosclerosis of native coronary artery 08/24/2008   S/P PCI   Dementia 12/15/2021   concerns for Alzheimer's disease   Essential hypertension, benign 08/24/2008   Fatigue 08/08/2010   History of nuclear stress test    Myoview 12/16: EF 66%, very mild inferior ischemia; Low Risk   Mixed hyperlipidemia 08/24/2008   Myocardial infarction 10/06/2007   Nonproliferative diabetic retinopathy 08/08/2011   Nuclear sclerosis of both eyes 12/01/2017   Peripheral neuropathy    Pulmonary nodule, right 09/13/2007   Right upper lobe         Sinoatrial node dysfunction 08/24/2008   Chronotropic incompetence   Snoring 09/27/2009   Type II diabetes mellitus 08/08/2011   Past Surgical History:  Procedure Laterality Date   LEFT HEART CATHETERIZATION WITH CORONARY ANGIOGRAM N/A 03/04/2012   Procedure: LEFT HEART CATHETERIZATION WITH CORONARY ANGIOGRAM;  Surgeon: Kathleene Hazel, MD;  Location: [facility] CATH LAB;  Service: Cardiovascular;  Laterality: N/A;   TONSILLECTOMY      Allergies  Allergen Reactions   Ace Inhibitors    Alpha Lipoic Acid [Lipoic Acid]     Upset Stomach   Invokana [Canagliflozin]    Heparin Hives   Isosorbide Mononitrate [Isosorbide Dinitrate Er] Hives   Penicillins Hives   Sulfonamide Derivatives Hives    Outpatient Encounter Medications as of 05/22/2023  Medication Sig   amLODipine (NORVASC) 10 MG tablet TAKE 1  TABLET ONCE DAILY.   aspirin 81 MG tablet Take 1 tablet (81 mg total) by mouth daily.   atorvastatin (LIPITOR) 10 MG tablet Take 10 mg by mouth daily.   citalopram (CELEXA) 20 MG tablet Take 20 mg by mouth daily.   Cyanocobalamin (VITAMIN B 12) 500 MCG TABS Take 2 tablets by mouth every other day.   dextromethorphan-guaiFENesin (ROBITUSSIN-DM) 10-100 MG/5ML liquid Take by mouth every 6 (six) hours as needed for cough.   divalproex (DEPAKOTE ER) 250 MG 24 hr tablet Take 3 tablets (750 mg total) by mouth at bedtime.   famotidine (PEPCID) 20 MG tablet Take 20 mg by mouth 2 (two) times daily.   hydrochlorothiazide (HYDRODIURIL) 25 MG tablet TAKE 1 TABLET ONCE DAILY.   hydrOXYzine (VISTARIL) 25 MG capsule Take 1 capsule (25 mg total) by mouth 2 (two) times daily as needed (increased agitation/ sundowning).   hydrOXYzine (VISTARIL) 25 MG capsule Take 25 mg by mouth in the morning and at bedtime. Give at 3 and 5pm   insulin aspart (NOVOLOG) 100 UNIT/ML injection Inject 10 Units into the skin once. With lunch and Dinner Only if CBGS more then 200   LANTUS SOLOSTAR 100 UNIT/ML injection Inject 38 Units into the skin every morning.   latanoprost (XALATAN) 0.005 % ophthalmic solution Place 1 drop into both eyes at bedtime.   memantine (NAMENDA) 10 MG tablet TAKE ONE TABLET BY MOUTH EVERY MORNING and TAKE ONE TABLET BY MOUTH EVERYDAY AT BEDTIME   metformin (FORTAMET) 500 MG (OSM) 24 hr tablet Take 500 mg by mouth 2 (two) times daily with a meal.   mirtazapine (REMERON SOL-TAB) 15 MG disintegrating tablet Take 1 tablet (15 mg total) by mouth at bedtime.   rivastigmine (EXELON) 1.5 MG capsule TAKE ONE CAPSULE BY MOUTH TWICE DAILY   spironolactone (ALDACTONE) 25 MG tablet Take 25 mg by mouth daily.   ferrous sulfate 325 (65 FE) MG EC tablet Take 325 mg by mouth 3 (three) times a week.   Suvorexant (BELSOMRA) 10 MG TABS Take 1 tablet (10 mg total) by mouth at bedtime. (Patient not taking: Reported on 05/11/2023)    No facility-administered encounter medications on file as of 05/22/2023.    Review of Systems  Constitutional:  Negative for activity change, appetite change, chills, diaphoresis, fatigue, fever and unexpected weight change.  Respiratory:  Positive for cough. Negative for shortness of breath, wheezing and stridor.   Cardiovascular:  Negative for chest pain, palpitations and leg swelling.  Gastrointestinal:  Negative for abdominal distention, abdominal pain, constipation and diarrhea.  Genitourinary:  Negative for difficulty urinating and dysuria.  Musculoskeletal:  Positive for gait problem. Negative for arthralgias, back pain, joint swelling and myalgias.  Neurological:  Negative for dizziness, seizures, syncope, facial asymmetry, speech difficulty, weakness and headaches.  Hematological:  Negative for adenopathy. Does not bruise/bleed easily.  Psychiatric/Behavioral:  Positive for agitation and confusion.     Immunization History  Administered Date(s) Administered   Fluad Quad(high Dose 65+) 04/08/2022, 04/28/2023   Influenza Split  05/01/2010, 03/11/2011, 04/06/2012, 04/21/2013, 05/03/2021   Influenza Whole 03/07/2009   Influenza, High Dose Seasonal PF 03/29/2014, 04/02/2015, 03/31/2016, 04/08/2017, 04/16/2018   Influenza-Unspecified 04/16/2019, 03/29/2020   Moderna Covid-19 Fall Seasonal Vaccine 69yrs & older 05/07/2022   Moderna Covid-19 Vaccine Bivalent Booster 38yrs & up 04/28/2023   Moderna Sars-Covid-2 Vaccination 07/19/2019, 08/17/2019, 06/01/2020, 12/09/2021   Pneumococcal Conjugate-13 03/29/2014   Pneumococcal Polysaccharide-23 10/31/2003, 09/07/2006, 03/25/2013   Td (Adult) 10/30/1993   Tdap 07/07/2010, 03/19/2011, 04/16/2023   Zoster Recombinant(Shingrix) 10/24/2016, 12/24/2016   Zoster, Live 10/30/2005, 10/21/2016, 12/24/2016   Zoster, Unspecified 10/24/2016   Pertinent  Health Maintenance Due  Topic Date Due   OPHTHALMOLOGY EXAM  07/30/2023   HEMOGLOBIN A1C   08/27/2023   FOOT EXAM  10/07/2023   INFLUENZA VACCINE  Completed      10/07/2022   10:32 AM 03/31/2023    1:05 PM 04/09/2023    4:03 PM 04/20/2023    4:07 PM 05/22/2023   10:57 AM  Fall Risk  Falls in the past year? 1 1 1  0 0  Was there an injury with Fall? 0 1 1 0 0  Fall Risk Category Calculator 1 3 2  0 0  Patient at Risk for Falls Due to History of fall(s)  History of fall(s) No Fall Risks No Fall Risks  Fall risk Follow up Falls evaluation completed Falls evaluation completed Falls evaluation completed Falls evaluation completed Falls evaluation completed   Functional Status Survey:    Vitals:   05/22/23 1054  BP: (!) 152/88  Pulse: 62  Resp: 18  Temp: 98 F (36.7 C)  TempSrc: Temporal  SpO2: 95%  Weight: 190 lb (86.2 kg)  Height: 6\' 2"  (1.88 m)   Body mass index is 24.39 kg/m. Physical Exam Vitals and nursing note reviewed.  Constitutional:      General: He is not in acute distress.    Appearance: He is not diaphoretic.  HENT:     Head: Normocephalic and atraumatic.  Neck:     Thyroid: No thyromegaly.     Vascular: No JVD.     Trachea: No tracheal deviation.  Cardiovascular:     Rate and Rhythm: Normal rate and regular rhythm.     Heart sounds: No murmur heard. Pulmonary:     Effort: Pulmonary effort is normal. No respiratory distress.     Breath sounds: Normal breath sounds. No wheezing.  Abdominal:     General: Bowel sounds are normal. There is no distension.     Palpations: Abdomen is soft.     Tenderness: There is no abdominal tenderness.  Musculoskeletal:     Comments: Trace edema to both ankles.   Lymphadenopathy:     Cervical: No cervical adenopathy.  Skin:    General: Skin is warm and dry.  Neurological:     General: No focal deficit present.     Mental Status: He is alert. Mental status is at baseline.     Labs reviewed: Recent Labs    08/19/22 0000 01/14/23 0410 02/24/23 0000  NA 138 140 142  K 4.5 4.3 4.5  CL 101 107 106  CO2   --  23 24*  GLUCOSE  --  93  --   BUN 27* 30* 33*  CREATININE 1.3 1.61* 1.4*  CALCIUM 9.1 9.1 9.0   Recent Labs    08/19/22 0000 02/24/23 0000  AST 27 32  ALT 22 25  ALKPHOS  --  104  ALBUMIN 4.0 3.9   Recent Labs  08/19/22 0000 01/14/23 0410 02/24/23 0000  WBC 6.0 8.5 5.7  NEUTROABS  --  6.7  --   HGB 12.5* 12.1* 11.9*  HCT 37* 35.9* 36*  MCV  --  94.7  --   PLT 261 217 294   Lab Results  Component Value Date   TSH 2.58 08/19/2022   Lab Results  Component Value Date   HGBA1C 7.7 02/24/2023   Lab Results  Component Value Date   CHOL 136 08/19/2022   HDL 37 08/19/2022   LDLCALC 80 08/19/2022   TRIG 96 08/19/2022   CHOLHDL 3.0 09/04/2007    Significant Diagnostic Results in last 30 days:  No results found.  Assessment/Plan  1. Lethargy Improved possibly due to med s/e and/or viral illness.   2. Alzheimer's dementia with behavioral disturbance (HCC) Progressive decline in cognition and physical function c/w the disease. Continue supportive care in the skilled environment. Behaviors have improved on current regimen Following up with Dr Donell Beers   3. Essential hypertension Above goal On aldactone, hydrochlorothiazide, and norvasc  Allergy listed to ACE Manual bp daily x 7 days [facility]   Family/ staff Communication: nurse  Labs/tests ordered:  NA

## 2023-06-03 ENCOUNTER — Other Ambulatory Visit: Payer: Self-pay | Admitting: Orthopedic Surgery

## 2023-06-03 DIAGNOSIS — G4709 Other insomnia: Secondary | ICD-10-CM

## 2023-06-03 MED ORDER — BELSOMRA 10 MG PO TABS: 10.0000 mg | ORAL_TABLET | Freq: Every evening | ORAL | 0 refills | Status: DC | PRN

## 2023-06-11 ENCOUNTER — Encounter: Payer: Self-pay | Admitting: Adult Health

## 2023-06-11 ENCOUNTER — Non-Acute Institutional Stay (SKILLED_NURSING_FACILITY): Payer: PPO | Admitting: Adult Health

## 2023-06-11 DIAGNOSIS — E162 Hypoglycemia, unspecified: Secondary | ICD-10-CM | POA: Diagnosis not present

## 2023-06-11 DIAGNOSIS — G4709 Other insomnia: Secondary | ICD-10-CM

## 2023-06-11 DIAGNOSIS — Z794 Long term (current) use of insulin: Secondary | ICD-10-CM

## 2023-06-11 DIAGNOSIS — E113293 Type 2 diabetes mellitus with mild nonproliferative diabetic retinopathy without macular edema, bilateral: Secondary | ICD-10-CM

## 2023-06-11 DIAGNOSIS — G309 Alzheimer's disease, unspecified: Secondary | ICD-10-CM | POA: Diagnosis not present

## 2023-06-11 DIAGNOSIS — R251 Tremor, unspecified: Secondary | ICD-10-CM

## 2023-06-11 DIAGNOSIS — F02818 Dementia in other diseases classified elsewhere, unspecified severity, with other behavioral disturbance: Secondary | ICD-10-CM

## 2023-06-11 DIAGNOSIS — I10 Essential (primary) hypertension: Secondary | ICD-10-CM | POA: Diagnosis not present

## 2023-06-11 MED ORDER — BELSOMRA 10 MG PO TABS
10.0000 mg | ORAL_TABLET | Freq: Every day | ORAL | Status: DC
Start: 2023-06-11 — End: 2023-06-15

## 2023-06-11 NOTE — Progress Notes (Signed)
Location:  Medical illustrator of Service:  SNF (31) Provider:   Peggye Ley, ANP [facility] [phone]   Mahlon Gammon, MD  Patient Care Team: Mahlon Gammon, MD as PCP - General (Internal Medicine) Kathleene Hazel, MD as PCP - Cardiology (Cardiology) Drema Dallas, DO as Consulting Physician (Neurology)  Extended Emergency Contact Information Primary Emergency Contact: [facility] Address: [address]          [place], [place] [address] D[place] of [place] Home Phone: [phone] Mobile Phone: [phone] Relation: Daughter Secondary Emergency Contact: [facility] Phone: [phone] Relation: Spouse Preferred language: English Interpreter needed? No  Code Status:  DNR Goals of care: Advanced Directive information    05/22/2023   10:58 AM  Advanced Directives  Does Patient Have a Medical Advance Directive? Yes  Type of Estate agent of [place];Living will;Out of facility DNR (pink MOST or yellow form)  Does patient want to make changes to medical advance directive? No - Patient declined  Copy of Healthcare Power of Attorney in Chart? Yes - validated most recent copy scanned in chart (See row information)  Pre-existing out of facility DNR order (yellow form or pink MOST form) Yellow form placed in chart (order not valid for inpatient use)     Chief Complaint  Patient presents with   Acute Visit    Low blood sugar    HPI:  Pt is a 87 y.o. male seen today for  low blood sugar and medical management of chronic diseases.   PMH: dementia, MI, HTN, CAD, carotid stenosis, sleep apnea, type 2 DM, neuropathy, CKD, HLD, depression.  The nurse reports this am the CBG was 51 He was given juice and a PB and J sandwich and his blood sugar improved to 120.  He has had some other episodes in the 80-100 range in the am. He does not feel any symptoms when his blood sugar his low.   He is  sleeping well through the night. Tends to be groggy in the am per nurse.  MMSE 23/30  He had a fall last evening where he was found on the mat with no injury.   BP range 130-170's/60-80s No sob or edema He has gained weight since he moved to the memory care unit.  Can ambulate with a walker, tends to sit in the chair most of the  Help needed with dressing and bathing.  Wt Readings from Last 3 Encounters:  06/11/23 189 lb 12.8 oz (86.1 kg)  05/22/23 190 lb (86.2 kg)  05/11/23 181 lb 12.8 oz (82.5 kg)     Behaviors are improved. Followed by Dr Donell Beers. He can become easily angered with staff with verbal and physical aggression but this has lessened over time Depakote level     Past Medical History:  Diagnosis Date   CAD (coronary artery disease)    Bare meta stent RCA   Carotid stenosis    a. Carotid US 3/17: bilateral ICA 1-39% >> repeat 2 years   Coronary atherosclerosis of native coronary artery 08/24/2008   S/P PCI   Dementia 12/15/2021   concerns for Alzheimer's disease   Essential hypertension, benign 08/24/2008   Fatigue 08/08/2010   History of nuclear stress test    Myoview 12/16: EF 66%, very mild inferior ischemia; Low Risk   Mixed hyperlipidemia 08/24/2008   Myocardial infarction 10/06/2007   Nonproliferative diabetic retinopathy 08/08/2011   Nuclear sclerosis of both eyes 12/01/2017   Peripheral  neuropathy    Pulmonary nodule, right 09/13/2007   Right upper lobe         Sinoatrial node dysfunction 08/24/2008   Chronotropic incompetence   Snoring 09/27/2009   Type II diabetes mellitus 08/08/2011   Past Surgical History:  Procedure Laterality Date   LEFT HEART CATHETERIZATION WITH CORONARY ANGIOGRAM N/A 03/04/2012   Procedure: LEFT HEART CATHETERIZATION WITH CORONARY ANGIOGRAM;  Surgeon: Kathleene Hazel, MD;  Location: [facility] C[facility] LAB;  Service: Cardiovascular;  Laterality: N/A;   TONSILLECTOMY      Allergies  Allergen Reactions   Ace Inhibitors     Alpha Lipoic Acid [Lipoic Acid]     Upset Stomach   Invokana [Canagliflozin]    Heparin Hives   Isosorbide Mononitrate [Isosorbide Dinitrate Er] Hives   Penicillins Hives   Sulfonamide Derivatives Hives    Outpatient Encounter Medications as of 06/11/2023  Medication Sig   amLODipine (NORVASC) 10 MG tablet TAKE 1 TABLET ONCE DAILY.   aspirin 81 MG tablet Take 1 tablet (81 mg total) by mouth daily.   atorvastatin (LIPITOR) 10 MG tablet Take 10 mg by mouth daily.   citalopram (CELEXA) 20 MG tablet Take 20 mg by mouth daily.   Cyanocobalamin (VITAMIN B 12) 500 MCG TABS Take 2 tablets by mouth every other day.   dextromethorphan-guaiFENesin (ROBITUSSIN-DM) 10-100 MG/5ML liquid Take by mouth every 6 (six) hours as needed for cough.   divalproex (DEPAKOTE ER) 250 MG 24 hr tablet Take 3 tablets (750 mg total) by mouth at bedtime.   famotidine (PEPCID) 20 MG tablet Take 20 mg by mouth 2 (two) times daily.   ferrous sulfate 325 (65 FE) MG EC tablet Take 325 mg by mouth 3 (three) times a week.   hydrochlorothiazide (HYDRODIURIL) 25 MG tablet TAKE 1 TABLET ONCE DAILY.   hydrOXYzine (VISTARIL) 25 MG capsule Take 1 capsule (25 mg total) by mouth 2 (two) times daily as needed (increased agitation/ sundowning).   hydrOXYzine (VISTARIL) 25 MG capsule Take 25 mg by mouth in the morning and at bedtime. Give at 3 and 5pm   insulin aspart (NOVOLOG) 100 UNIT/ML injection Inject 10 Units into the skin once. With lunch and Dinner Only if CBGS more then 200   LANTUS SOLOSTAR 100 UNIT/ML injection Inject 32 Units into the skin every morning.   latanoprost (XALATAN) 0.005 % ophthalmic solution Place 1 drop into both eyes at bedtime.   memantine (NAMENDA) 10 MG tablet TAKE ONE TABLET BY MOUTH EVERY MORNING and TAKE ONE TABLET BY MOUTH EVERYDAY AT BEDTIME   metformin (FORTAMET) 500 MG (OSM) 24 hr tablet Take 500 mg by mouth 2 (two) times daily with a meal.   mirtazapine (REMERON SOL-TAB) 15 MG disintegrating tablet  Take 1 tablet (15 mg total) by mouth at bedtime.   rivastigmine (EXELON) 1.5 MG capsule TAKE ONE CAPSULE BY MOUTH TWICE DAILY   spironolactone (ALDACTONE) 25 MG tablet Take 25 mg by mouth daily.   Suvorexant (BELSOMRA) 10 MG TABS Take 1 tablet (10 mg total) by mouth at bedtime as needed.   No facility-administered encounter medications on file as of 06/11/2023.    Review of Systems  Immunization History  Administered Date(s) Administered   Fluad Quad(high Dose 65+) 04/08/2022, 04/28/2023   Influenza Split 05/01/2010, 03/11/2011, 04/06/2012, 04/21/2013, 05/03/2021   Influenza Whole 03/07/2009   Influenza, High Dose Seasonal PF 03/29/2014, 04/02/2015, 03/31/2016, 04/08/2017, 04/16/2018   Influenza-Unspecified 04/16/2019, 03/29/2020   Moderna Covid-19 Fall Seasonal Vaccine 71yrs & older 05/07/2022  Moderna Covid-19 Vaccine Bivalent Booster 37yrs & up 04/28/2023   Moderna Sars-Covid-2 Vaccination 07/19/2019, 08/17/2019, 06/01/2020, 12/09/2021   Pneumococcal Conjugate-13 03/29/2014   Pneumococcal Polysaccharide-23 10/31/2003, 09/07/2006, 03/25/2013   Td (Adult) 10/30/1993   Tdap 07/07/2010, 03/19/2011, 04/16/2023   Zoster Recombinant(Shingrix) 10/24/2016, 12/24/2016   Zoster, Live 10/30/2005, 10/21/2016, 12/24/2016   Zoster, Unspecified 10/24/2016   Pertinent  Health Maintenance Due  Topic Date Due   OPHTHALMOLOGY EXAM  07/30/2023   HEMOGLOBIN A1C  08/27/2023   FOOT EXAM  10/07/2023   INFLUENZA VACCINE  Completed      10/07/2022   10:32 AM 03/31/2023    1:05 PM 04/09/2023    4:03 PM 04/20/2023    4:07 PM 05/22/2023   10:57 AM  Fall Risk  Falls in the past year? 1 1 1  0 0  Was there an injury with Fall? 0 1 1 0 0  Fall Risk Category Calculator 1 3 2  0 0  Patient at Risk for Falls Due to History of fall(s)  History of fall(s) No Fall Risks No Fall Risks  Fall risk Follow up Falls evaluation completed Falls evaluation completed Falls evaluation completed Falls evaluation completed  Falls evaluation completed   Functional Status Survey:    Vitals:   06/11/23 1551  BP: (!) 162/90  Pulse: 64  Resp: 20  Temp: 97.6 F (36.4 C)  SpO2: 97%  Weight: 189 lb 12.8 oz (86.1 kg)   Body mass index is 24.37 kg/m. Physical Exam  Labs reviewed: Recent Labs    08/19/22 0000 01/14/23 0410 02/24/23 0000  NA 138 140 142  K 4.5 4.3 4.5  CL 101 107 106  CO2  --  23 24*  GLUCOSE  --  93  --   BUN 27* 30* 33*  CREATININE 1.3 1.61* 1.4*  CALCIUM 9.1 9.1 9.0   Recent Labs    08/19/22 0000 02/24/23 0000  AST 27 32  ALT 22 25  ALKPHOS  --  104  ALBUMIN 4.0 3.9   Recent Labs    08/19/22 0000 01/14/23 0410 02/24/23 0000  WBC 6.0 8.5 5.7  NEUTROABS  --  6.7  --   HGB 12.5* 12.1* 11.9*  HCT 37* 35.9* 36*  MCV  --  94.7  --   PLT 261 217 294   Lab Results  Component Value Date   TSH 2.58 08/19/2022   Lab Results  Component Value Date   HGBA1C 7.7 02/24/2023   Lab Results  Component Value Date   CHOL 136 08/19/2022   HDL 37 08/19/2022   LDLCALC 80 08/19/2022   TRIG 96 08/19/2022   CHOLHDL 3.0 09/04/2007    Significant Diagnostic Results in last 30 days:  No results found.  Assessment/Plan 1. Hypoglycemia Unawareness reported.  Reduce lantus to 32 units daily  Ensure regular meals, bed time snack.   2. 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   3. Essential hypertension Above goal at times but when manual bps are done they are lower.  Given his recent fall will avoid med changes.  Nurse to monitor bp manually and report.   4. Alzheimer's dementia with behavioral disturbance (HCC) Behaviors have improved.  Followed by neurology  Progressive decline in cognition and physical function c/w the disease. Continue supportive care in the skilled environment.  5. Tremor Can be  coarse or fine depending upon  the situation.  Not correlated with low blood sugar.  Labs have been unrevealing Consider medication effect, will reach out to Dr Donell Beers     Family/ staff Communication: discussed with his daughter.   Labs/tests ordered:  A1C in one month

## 2023-06-15 ENCOUNTER — Other Ambulatory Visit: Payer: Self-pay | Admitting: Internal Medicine

## 2023-06-15 DIAGNOSIS — G4709 Other insomnia: Secondary | ICD-10-CM

## 2023-06-15 MED ORDER — BELSOMRA 10 MG PO TABS: 10.0000 mg | ORAL_TABLET | Freq: Every day | ORAL | 0 refills | Status: DC

## 2023-07-09 LAB — HM DIABETES FOOT EXAM

## 2023-07-09 LAB — HEMOGLOBIN A1C: Hemoglobin A1C: 8.3

## 2023-07-16 ENCOUNTER — Encounter: Payer: Self-pay | Admitting: Adult Health

## 2023-07-16 ENCOUNTER — Non-Acute Institutional Stay (SKILLED_NURSING_FACILITY): Payer: PPO | Admitting: Adult Health

## 2023-07-16 DIAGNOSIS — E113293 Type 2 diabetes mellitus with mild nonproliferative diabetic retinopathy without macular edema, bilateral: Secondary | ICD-10-CM | POA: Diagnosis not present

## 2023-07-16 DIAGNOSIS — R251 Tremor, unspecified: Secondary | ICD-10-CM | POA: Diagnosis not present

## 2023-07-16 DIAGNOSIS — R1110 Vomiting, unspecified: Secondary | ICD-10-CM

## 2023-07-16 DIAGNOSIS — Z794 Long term (current) use of insulin: Secondary | ICD-10-CM | POA: Diagnosis not present

## 2023-07-16 MED ORDER — OMEPRAZOLE MAGNESIUM 20 MG PO TBEC
20.0000 mg | DELAYED_RELEASE_TABLET | Freq: Every day | ORAL | Status: DC
Start: 2023-07-16 — End: 2023-07-16

## 2023-07-16 MED ORDER — OMEPRAZOLE MAGNESIUM 20 MG PO TBEC: 20.0000 mg | DELAYED_RELEASE_TABLET | Freq: Every day | ORAL | Status: DC

## 2023-07-16 NOTE — Progress Notes (Signed)
 Location:  Medical Illustrator of Service:  SNF (31) Provider:   Bari America, ANP [facility] [phone]   Charlanne Fredia CROME, MD  Patient Care Team: Charlanne Fredia CROME, MD as PCP - General (Internal Medicine) Verlin Lonni BIRCH, MD as PCP - Cardiology (Cardiology) Skeet Juliene SAUNDERS, DO as Consulting Physician (Neurology)  Extended Emergency Contact Information Primary Emergency Contact: [facility] Address: [address]          [place], [place] [address] [place] Home Phone: [phone] Mobile Phone: [phone] Relation: Daughter Secondary Emergency Contact: [facility] Phone: [phone] Relation: Spouse Preferred language: English Interpreter needed? No  Code Status:  DNR Goals of care: Advanced Directive information    05/22/2023   10:58 AM  Advanced Directives  Does Patient Have a Medical Advance Directive? Yes  Type of Estate Agent of [place];Living will;Out of facility DNR (pink MOST or yellow form)  Does patient want to make changes to medical advance directive? No - Patient declined  Copy of Healthcare Power of Attorney in Chart? Yes - validated most recent copy scanned in chart (See row information)  Pre-existing out of facility DNR order (yellow form or pink MOST form) Yellow form placed in chart (order not valid for inpatient use)     Chief Complaint  Patient presents with   Acute Visit    Vomiting     HPI:  Pt is a 88 y.o. male seen today for an acute visit for vomiting  PMH: dementia, MI, HTN, CAD, carotid stenosis, sleep apnea, type 2 DM, neuropathy, CKD, HLD, depression.   Resides in the memory care unit.   The nurse reports he has had several vomiting episodes since 06/29/23.  The problem is intermittent. Possibly 4-5 episodes. Tends to happened with a meal but can be any time. Several of the episodes were described as digested food, the vomiting episode this  am was bile. He denies abd pain. No fever. Does not feel ill (although he is a poor historian) The nurse reports he is going to the BR normally, no diarrhea or constipation. No urinary symptoms or cough/cold symptoms.    CBG this am 93, 159 at lunch  A1C 8.3 07/09/23  Depakote  level 42 05/06/23  He is still having tremors to his hands. The tremors occur at rest and with intention. No rigidity. Celexa was decreased by Dr Tasia on 06/12/23 but that his not helped this symptom.      Past Medical History:  Diagnosis Date   CAD (coronary artery disease)    Bare meta stent RCA   Carotid stenosis    a. Carotid US  3/17: bilateral ICA 1-39% >> repeat 2 years   Coronary atherosclerosis of native coronary artery 08/24/2008   S/P PCI   Dementia 12/15/2021   concerns for Alzheimer's disease   Essential hypertension, benign 08/24/2008   Fatigue 08/08/2010   History of nuclear stress test    Myoview  12/16: EF 66%, very mild inferior ischemia; Low Risk   Mixed hyperlipidemia 08/24/2008   Myocardial infarction 10/06/2007   Nonproliferative diabetic retinopathy 08/08/2011   Nuclear sclerosis of both eyes 12/01/2017   Peripheral neuropathy    Pulmonary nodule, right 09/13/2007   Right upper lobe         Sinoatrial node dysfunction 08/24/2008   Chronotropic incompetence   Snoring 09/27/2009   Type II diabetes mellitus 08/08/2011   Past Surgical History:  Procedure Laterality Date   LEFT HEART CATHETERIZATION WITH  CORONARY ANGIOGRAM N/A 03/04/2012   Procedure: LEFT HEART CATHETERIZATION WITH CORONARY ANGIOGRAM;  Surgeon: Lonni JONETTA Cash, MD;  Location: [facility] CATH LAB;  Service: Cardiovascular;  Laterality: N/A;   TONSILLECTOMY      Allergies  Allergen Reactions   Ace Inhibitors    Alpha Lipoic Acid [Lipoic Acid]     Upset Stomach   Invokana [Canagliflozin]    Heparin  Hives   Isosorbide  Mononitrate [Isosorbide  Dinitrate Er] Hives   Penicillins Hives   Sulfonamide Derivatives  Hives    Outpatient Encounter Medications as of 07/16/2023  Medication Sig   divalproex  (DEPAKOTE  ER) 250 MG 24 hr tablet Take 3 tablets (750 mg total) by mouth at bedtime.   famotidine (PEPCID) 20 MG tablet Take 20 mg by mouth 2 (two) times daily.   ferrous sulfate 325 (65 FE) MG EC tablet Take 325 mg by mouth 3 (three) times a week.   hydrochlorothiazide  (HYDRODIURIL ) 25 MG tablet TAKE 1 TABLET ONCE DAILY.   hydrOXYzine  (VISTARIL ) 25 MG capsule Take 1 capsule (25 mg total) by mouth 2 (two) times daily as needed (increased agitation/ sundowning).   hydrOXYzine  (VISTARIL ) 25 MG capsule Take 25 mg by mouth in the morning and at bedtime. Give at 3 and 5pm   insulin  aspart (NOVOLOG) 100 UNIT/ML injection Inject 10 Units into the skin once. With lunch and Dinner Only if CBGS more then 200   LANTUS  SOLOSTAR 100 UNIT/ML injection Inject 32 Units into the skin 2 (two) times daily.   memantine  (NAMENDA ) 10 MG tablet TAKE ONE TABLET BY MOUTH EVERY MORNING and TAKE ONE TABLET BY MOUTH EVERYDAY AT BEDTIME   metformin (FORTAMET) 500 MG (OSM) 24 hr tablet Take 250 mg by mouth 2 (two) times daily with a meal.   mirtazapine  (REMERON  SOL-TAB) 15 MG disintegrating tablet Take 1 tablet (15 mg total) by mouth at bedtime.   rivastigmine  (EXELON ) 1.5 MG capsule TAKE ONE CAPSULE BY MOUTH TWICE DAILY   spironolactone (ALDACTONE) 25 MG tablet Take 25 mg by mouth daily.   Suvorexant  (BELSOMRA ) 10 MG TABS Take 1 tablet (10 mg total) by mouth at bedtime.   [DISCONTINUED] omeprazole  (PRILOSEC  OTC) 20 MG tablet Take 1 tablet (20 mg total) by mouth daily.   amLODipine  (NORVASC ) 10 MG tablet TAKE 1 TABLET ONCE DAILY.   aspirin  81 MG tablet Take 1 tablet (81 mg total) by mouth daily.   atorvastatin (LIPITOR) 10 MG tablet Take 10 mg by mouth daily.   citalopram (CELEXA) 20 MG tablet Take 20 mg by mouth daily.   Cyanocobalamin  (VITAMIN B 12) 500 MCG TABS Take 2 tablets by mouth every other day.   dextromethorphan-guaiFENesin  (ROBITUSSIN-DM) 10-100 MG/5ML liquid Take by mouth every 6 (six) hours as needed for cough.   latanoprost (XALATAN) 0.005 % ophthalmic solution Place 1 drop into both eyes at bedtime.   omeprazole  (PRILOSEC  OTC) 20 MG tablet Take 1 tablet (20 mg total) by mouth daily for 14 days.   No facility-administered encounter medications on file as of 07/16/2023.    Review of Systems  Unable to perform ROS: Dementia    Immunization History  Administered Date(s) Administered   Fluad  Quad(high Dose 65+) 04/08/2022, 04/28/2023   Influenza Split 05/01/2010, 03/11/2011, 04/06/2012, 04/21/2013, 05/03/2021   Influenza Whole 03/07/2009   Influenza, High Dose Seasonal PF 03/29/2014, 04/02/2015, 03/31/2016, 04/08/2017, 04/16/2018   Influenza-Unspecified 04/16/2019, 03/29/2020   Moderna Covid-19 Fall Seasonal Vaccine 62yrs & older 05/07/2022   Moderna Covid-19 Vaccine Bivalent Booster 37yrs & up 04/28/2023  Moderna Sars-Covid-2 Vaccination 07/19/2019, 08/17/2019, 06/01/2020, 12/09/2021   Pneumococcal Conjugate-13 03/29/2014   Pneumococcal Polysaccharide-23 10/31/2003, 09/07/2006, 03/25/2013   Td (Adult) 10/30/1993   Tdap 07/07/2010, 03/19/2011, 04/16/2023   Zoster Recombinant(Shingrix) 10/24/2016, 12/24/2016   Zoster, Live 10/30/2005, 10/21/2016, 12/24/2016   Zoster, Unspecified 10/24/2016   Pertinent  Health Maintenance Due  Topic Date Due   OPHTHALMOLOGY EXAM  07/30/2023   HEMOGLOBIN A1C  08/27/2023   FOOT EXAM  10/07/2023   INFLUENZA VACCINE  Completed      10/07/2022   10:32 AM 03/31/2023    1:05 PM 04/09/2023    4:03 PM 04/20/2023    4:07 PM 05/22/2023   10:57 AM  Fall Risk  Falls in the past year? 1 1 1  0 0  Was there an injury with Fall? 0 1 1 0 0  Fall Risk Category Calculator 1 3 2  0 0  Patient at Risk for Falls Due to History of fall(s)  History of fall(s) No Fall Risks No Fall Risks  Fall risk Follow up Falls evaluation completed Falls evaluation completed Falls evaluation completed  Falls evaluation completed Falls evaluation completed   Functional Status Survey:    Vitals:   07/16/23 1516  BP: (!) 159/87  Pulse: 77  Resp: 20  Temp: (!) 97.2 F (36.2 C)  SpO2: 95%  Weight: 182 lb 3.2 oz (82.6 kg)   Body mass index is 23.39 kg/m. Physical Exam Vitals and nursing note reviewed.  Constitutional:      General: He is not in acute distress.    Appearance: He is not diaphoretic.  HENT:     Head: Normocephalic and atraumatic.     Right Ear: There is impacted cerumen.     Left Ear: There is impacted cerumen.     Nose: Nose normal.     Mouth/Throat:     Mouth: Mucous membranes are moist.     Pharynx: Oropharynx is clear.  Eyes:     Conjunctiva/sclera: Conjunctivae normal.     Pupils: Pupils are equal, round, and reactive to light.  Neck:     Thyroid : No thyromegaly.     Vascular: No JVD.     Trachea: No tracheal deviation.  Cardiovascular:     Rate and Rhythm: Normal rate and regular rhythm.     Heart sounds: No murmur heard. Pulmonary:     Effort: Pulmonary effort is normal. No respiratory distress.     Breath sounds: Normal breath sounds. No wheezing.  Abdominal:     General: Bowel sounds are normal. There is no distension.     Palpations: Abdomen is soft. There is no mass.     Tenderness: There is no abdominal tenderness. There is no right CVA tenderness, left CVA tenderness or guarding.  Lymphadenopathy:     Cervical: No cervical adenopathy.  Skin:    General: Skin is warm and dry.  Neurological:     Mental Status: He is alert. Mental status is at baseline.     Cranial Nerves: No cranial nerve deficit.     Comments: Coarse tremor with intention   Psychiatric:        Mood and Affect: Mood normal.     Labs reviewed: Recent Labs    08/19/22 0000 01/14/23 0410 02/24/23 0000  NA 138 140 142  K 4.5 4.3 4.5  CL 101 107 106  CO2  --  23 24*  GLUCOSE  --  93  --   BUN 27* 30* 33*  CREATININE 1.3 1.61*  1.4*  CALCIUM 9.1 9.1 9.0   Recent  Labs    08/19/22 0000 02/24/23 0000  AST 27 32  ALT 22 25  ALKPHOS  --  104  ALBUMIN 4.0 3.9   Recent Labs    08/19/22 0000 01/14/23 0410 02/24/23 0000  WBC 6.0 8.5 5.7  NEUTROABS  --  6.7  --   HGB 12.5* 12.1* 11.9*  HCT 37* 35.9* 36*  MCV  --  94.7  --   PLT 261 217 294   Lab Results  Component Value Date   TSH 2.58 08/19/2022   Lab Results  Component Value Date   HGBA1C 7.7 02/24/2023   Lab Results  Component Value Date   CHOL 136 08/19/2022   HDL 37 08/19/2022   LDLCALC 80 08/19/2022   TRIG 96 08/19/2022   CHOLHDL 3.0 09/04/2007    Significant Diagnostic Results in last 30 days:  No results found.  Assessment/Plan 1. Vomiting, unspecified vomiting type, unspecified whether nausea present (Primary) Slightly hypoactive bowel sounds Check KUB Differential gastroparesis, reflux, med s/e, constipation  Start trial of PPI Reduce metformin to 250 mg bid Check labs.  - omeprazole  (PRILOSEC  OTC) 20 MG tablet; Take 1 tablet (20 mg total) by mouth daily for 14 days.  2. 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 Tremor No improvement with reduction in Celexa  May need further med review or could be due to declining cognitive status.  Staff to reach out to [facility] staff Communication: nurse  Labs/tests ordered:  CMP CBC Mg Depakote  level KUB

## 2023-07-20 ENCOUNTER — Non-Acute Institutional Stay (SKILLED_NURSING_FACILITY): Payer: Self-pay | Admitting: Internal Medicine

## 2023-07-20 ENCOUNTER — Encounter: Payer: Self-pay | Admitting: Internal Medicine

## 2023-07-20 ENCOUNTER — Telehealth: Payer: Self-pay | Admitting: Adult Health

## 2023-07-20 DIAGNOSIS — E113293 Type 2 diabetes mellitus with mild nonproliferative diabetic retinopathy without macular edema, bilateral: Secondary | ICD-10-CM

## 2023-07-20 DIAGNOSIS — I10 Essential (primary) hypertension: Secondary | ICD-10-CM

## 2023-07-20 DIAGNOSIS — N289 Disorder of kidney and ureter, unspecified: Secondary | ICD-10-CM

## 2023-07-20 DIAGNOSIS — R1110 Vomiting, unspecified: Secondary | ICD-10-CM

## 2023-07-20 DIAGNOSIS — Z794 Long term (current) use of insulin: Secondary | ICD-10-CM

## 2023-07-20 DIAGNOSIS — R251 Tremor, unspecified: Secondary | ICD-10-CM

## 2023-07-20 DIAGNOSIS — F02818 Dementia in other diseases classified elsewhere, unspecified severity, with other behavioral disturbance: Secondary | ICD-10-CM

## 2023-07-20 DIAGNOSIS — G309 Alzheimer's disease, unspecified: Secondary | ICD-10-CM

## 2023-07-20 MED ORDER — HYDRALAZINE HCL 10 MG PO TABS: 10.0000 mg | ORAL_TABLET | Freq: Three times a day (TID) | ORAL | Status: DC | PRN

## 2023-07-20 NOTE — Progress Notes (Signed)
 Location:  [facility] Room Number: 309A Place of Service:  SNF [phone]) Provider:  Charlanne Fredia CROME, MD   Charlanne Fredia CROME, MD  Patient Care Team: Charlanne Fredia CROME, MD as PCP - General (Internal Medicine) Verlin Lonni BIRCH, MD as PCP - Cardiology (Cardiology) Skeet Juliene SAUNDERS, DO as Consulting Physician (Neurology)  Extended Emergency Contact Information Primary Emergency Contact: [facility] Address: [address]          [place], [place] [address] [place] Home Phone: [phone] Mobile Phone: [phone] Relation: Daughter Secondary Emergency Contact: [facility] Phone: [phone] Relation: Spouse Preferred language: English Interpreter needed? No  Code Status:  DNR Goals of care: Advanced Directive information    07/20/2023    1:18 PM  Advanced Directives  Does Patient Have a Medical Advance Directive? Yes  Type of Estate Agent of [place];Living will;Out of facility DNR (pink MOST or yellow form)  Does patient want to make changes to medical advance directive? No - Patient declined  Copy of Healthcare Power of Attorney in Chart? No - copy requested  Pre-existing out of facility DNR order (yellow form or pink MOST form) Yellow form placed in chart (order not valid for inpatient use)     Chief Complaint  Patient presents with   Acute Visit    Patient is being seen for acute vomiting    HPI:  Pt is a 88 y.o. male seen today for an acute visit for Behaviors and Renal Insufficiency  Lives in Memory Unit in [place] Wife there also in different rooms   Recent admission to Memory unit with his Wife  in [place]  Alzheimer Dementia MRI shows Advanced Atrophy Last MMSE 23/30 per Neurology  Patient has been on high dose of Depakote , Remeron , Vistaril , Namenda  and oral Exelon .  Follows with Dr. Plovski for behaviors Recently patient been having intermittent episodes of vomiting since 12/23 no  abdominal pain  KUB done was negative  Labs were done which showed normal hepatic panel but patient's creatinine was elevated at 2.0 His medications both at HCTZ and Aldactone was stopped to see if it would improve his creatinine  Patient was also noticed to have uncontrolled tremors and Dr. Plovski has now decided to take him off the Depakote  Patient is taking orally. with no issues today   His blood pressure is running on the high side due to taking off the Aldactone and hydrochlorothiazide  He was also taken off metformin and that has helped his low blood sugars  He did not have any complaints today was sitting comfortably in wheelchair hard to get history from him due to his dementia but per nurses he is doing okay  Past Medical History:  Diagnosis Date   CAD (coronary artery disease)    Bare meta stent RCA   Carotid stenosis    a. Carotid US  3/17: bilateral ICA 1-39% >> repeat 2 years   Coronary atherosclerosis of native coronary artery 08/24/2008   S/P PCI   Dementia 12/15/2021   concerns for Alzheimer's disease   Essential hypertension, benign 08/24/2008   Fatigue 08/08/2010   History of nuclear stress test    Myoview  12/16: EF 66%, very mild inferior ischemia; Low Risk   Mixed hyperlipidemia 08/24/2008   Myocardial infarction 10/06/2007   Nonproliferative diabetic retinopathy 08/08/2011   Nuclear sclerosis of both eyes 12/01/2017   Peripheral neuropathy    Pulmonary nodule, right 09/13/2007   Right upper lobe  Sinoatrial node dysfunction 08/24/2008   Chronotropic incompetence   Snoring 09/27/2009   Type II diabetes mellitus 08/08/2011   Past Surgical History:  Procedure Laterality Date   LEFT HEART CATHETERIZATION WITH CORONARY ANGIOGRAM N/A 03/04/2012   Procedure: LEFT HEART CATHETERIZATION WITH CORONARY ANGIOGRAM;  Surgeon: Lonni JONETTA Cash, MD;  Location[facility] CATH LAB;  Service: Cardiovascular;  Laterality: N/A;   TONSILLECTOMY      Allergies   Allergen Reactions   Ace Inhibitors    Alpha Lipoic Acid [Lipoic Acid]     Upset Stomach   Invokana [Canagliflozin]    Heparin  Hives   Isosorbide  Mononitrate [Isosorbide  Dinitrate Er] Hives   Penicillins Hives   Sulfonamide Derivatives Hives    Outpatient Encounter Medications as of 07/20/2023  Medication Sig   amLODipine  (NORVASC ) 10 MG tablet TAKE 1 TABLET ONCE DAILY.   aspirin  81 MG tablet Take 1 tablet (81 mg total) by mouth daily.   atorvastatin (LIPITOR) 10 MG tablet Take 10 mg by mouth daily.   citalopram (CELEXA) 20 MG tablet Take 20 mg by mouth daily.   Cyanocobalamin  (VITAMIN B 12) 500 MCG TABS Take 2 tablets by mouth every other day.   dextromethorphan-guaiFENesin (ROBITUSSIN-DM) 10-100 MG/5ML liquid Take by mouth every 6 (six) hours as needed for cough.   divalproex  (DEPAKOTE  ER) 250 MG 24 hr tablet Take 3 tablets (750 mg total) by mouth at bedtime.   famotidine (PEPCID) 20 MG tablet Take 20 mg by mouth 2 (two) times daily.   ferrous sulfate 325 (65 FE) MG EC tablet Take 325 mg by mouth 3 (three) times a week.   hydrALAZINE  (APRESOLINE ) 10 MG tablet Take 1 tablet (10 mg total) by mouth 3 (three) times daily as needed.   hydrOXYzine  (VISTARIL ) 25 MG capsule Take 1 capsule (25 mg total) by mouth 2 (two) times daily as needed (increased agitation/ sundowning).   hydrOXYzine  (VISTARIL ) 25 MG capsule Take 25 mg by mouth in the morning and at bedtime. Give at 3 and 5pm   insulin  aspart (NOVOLOG) 100 UNIT/ML injection Inject 10 Units into the skin once. With lunch and Dinner Only if CBGS more then 200   LANTUS  SOLOSTAR 100 UNIT/ML injection Inject 32 Units into the skin 2 (two) times daily.   latanoprost (XALATAN) 0.005 % ophthalmic solution Place 1 drop into both eyes at bedtime.   memantine  (NAMENDA ) 10 MG tablet TAKE ONE TABLET BY MOUTH EVERY MORNING and TAKE ONE TABLET BY MOUTH EVERYDAY AT BEDTIME   mirtazapine  (REMERON  SOL-TAB) 15 MG disintegrating tablet Take 1 tablet (15 mg  total) by mouth at bedtime.   omeprazole  (PRILOSEC  OTC) 20 MG tablet Take 1 tablet (20 mg total) by mouth daily for 14 days.   ondansetron  (ZOFRAN ) 4 MG tablet Take 4 mg by mouth every 6 (six) hours as needed for nausea or vomiting.   rivastigmine  (EXELON ) 1.5 MG capsule TAKE ONE CAPSULE BY MOUTH TWICE DAILY   Suvorexant  (BELSOMRA ) 10 MG TABS Take 1 tablet (10 mg total) by mouth at bedtime.   No facility-administered encounter medications on file as of 07/20/2023.    Review of Systems  Unable to perform ROS: Dementia    Immunization History  Administered Date(s) Administered   Fluad  Quad(high Dose 65+) 04/08/2022, 04/28/2023   Influenza Split 05/01/2010, 03/11/2011, 04/06/2012, 04/21/2013, 05/03/2021   Influenza Whole 03/07/2009   Influenza, High Dose Seasonal PF 03/29/2014, 04/02/2015, 03/31/2016, 04/08/2017, 04/16/2018   Influenza-Unspecified 04/16/2019, 03/29/2020   Moderna Covid-19 Fall Seasonal Vaccine 45yrs & older  05/07/2022   Moderna Covid-19 Vaccine Bivalent Booster 11yrs & up 04/28/2023   Moderna Sars-Covid-2 Vaccination 07/19/2019, 08/17/2019, 06/01/2020, 12/09/2021   Pneumococcal Conjugate-13 03/29/2014   Pneumococcal Polysaccharide-23 10/31/2003, 09/07/2006, 03/25/2013   Td (Adult) 10/30/1993   Tdap 07/07/2010, 03/19/2011, 04/16/2023   Zoster Recombinant(Shingrix) 10/24/2016, 12/24/2016   Zoster, Live 10/30/2005, 10/21/2016, 12/24/2016   Zoster, Unspecified 10/24/2016   Pertinent  Health Maintenance Due  Topic Date Due   OPHTHALMOLOGY EXAM  07/30/2023   HEMOGLOBIN A1C  08/27/2023   FOOT EXAM  10/07/2023   INFLUENZA VACCINE  Completed      03/31/2023    1:05 PM 04/09/2023    4:03 PM 04/20/2023    4:07 PM 05/22/2023   10:57 AM 07/20/2023    1:17 PM  Fall Risk  Falls in the past year? 1 1 0 0 0  Was there an injury with Fall? 1 1 0 0 0  Fall Risk Category Calculator 3 2 0 0 0  Patient at Risk for Falls Due to  History of fall(s) No Fall Risks No Fall Risks No  Fall Risks  Fall risk Follow up Falls evaluation completed Falls evaluation completed Falls evaluation completed Falls evaluation completed Falls evaluation completed   Functional Status Survey:    Vitals:   07/20/23 1310  BP: (!) 146/76  Pulse: 71  Resp: 16  Temp: 97.7 F (36.5 C)  TempSrc: Temporal  SpO2: 99%  Weight: 182 lb 3.2 oz (82.6 kg)  Height: 6' 2 (1.88 m)   Body mass index is 23.39 kg/m. Physical Exam Vitals reviewed.  Constitutional:      Appearance: Normal appearance.  HENT:     Head: Normocephalic.     Nose: Nose normal.     Mouth/Throat:     Mouth: Mucous membranes are moist.     Pharynx: Oropharynx is clear.  Eyes:     Pupils: Pupils are equal, round, and reactive to light.  Cardiovascular:     Rate and Rhythm: Normal rate and regular rhythm.     Pulses: Normal pulses.     Heart sounds: No murmur heard. Pulmonary:     Effort: Pulmonary effort is normal. No respiratory distress.     Breath sounds: Normal breath sounds. No rales.  Abdominal:     General: Abdomen is flat. Bowel sounds are normal.     Palpations: Abdomen is soft.  Musculoskeletal:        General: No swelling.     Cervical back: Neck supple.  Skin:    General: Skin is warm.  Neurological:     General: No focal deficit present.     Mental Status: He is alert.  Psychiatric:        Mood and Affect: Mood normal.        Thought Content: Thought content normal.     Labs reviewed: Recent Labs    08/19/22 0000 01/14/23 0410 02/24/23 0000  NA 138 140 142  K 4.5 4.3 4.5  CL 101 107 106  CO2  --  23 24*  GLUCOSE  --  93  --   BUN 27* 30* 33*  CREATININE 1.3 1.61* 1.4*  CALCIUM 9.1 9.1 9.0   Recent Labs    08/19/22 0000 02/24/23 0000  AST 27 32  ALT 22 25  ALKPHOS  --  104  ALBUMIN 4.0 3.9   Recent Labs    08/19/22 0000 01/14/23 0410 02/24/23 0000  WBC 6.0 8.5 5.7  NEUTROABS  --  6.7  --  HGB 12.5* 12.1* 11.9*  HCT 37* 35.9* 36*  MCV  --  94.7  --   PLT 261  217 294   Lab Results  Component Value Date   TSH 2.58 08/19/2022   Lab Results  Component Value Date   HGBA1C 7.7 02/24/2023   Lab Results  Component Value Date   CHOL 136 08/19/2022   HDL 37 08/19/2022   LDLCALC 80 08/19/2022   TRIG 96 08/19/2022   CHOLHDL 3.0 09/04/2007    Significant Diagnostic Results in last 30 days:  No results found.  Assessment/Plan 1. Vomiting, unspecified vomiting type, unspecified whether nausea present (Primary) Seems it is Episodic He is now off Depakote  Will Check  Amylase and Lipase also Abdomen Xray has been negative Continue Prilosec  2. Acute renal insufficiency Hydrochlorothiazide  and Aldactone stopped Repeat BMP   3. 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, with long-term current use of insulin , macular edema presence unspecified (HCC) Metformin stopped due to Renal Insufficiency and vomiting BS are covered with Insulin  now  4. Tremor Depakote  Stopped  5. Essential hypertension BP running high off Meds Will start him on Hydralazine  10 mg BID   6. Alzheimer's dementia with behavioral disturbance (HCC) oN Namenda , Exelon  and Vistaril  and Belsomara    Family/ staff Communication:   Labs/tests ordered:  Amylase and Lipase and BMP

## 2023-07-20 NOTE — Telephone Encounter (Signed)
 Late entry on 07/17/23 Labs returned showing BUN 46.1/Cr 2.0 normal LFTs, WBC 5.5, Hgb 12.5.  Mg 2.1 No further vomiting and no pain or fever KUB normal Discussed with Dr Charlanne. Pt is not a good candidate for an IV due to dementia with behaviors. He can drink well when encouraged. Will encourage oral fluid, stop, diuretics and metformin. Repeat labs Monday.  Use hydralazine  for elevated bp.

## 2023-07-21 ENCOUNTER — Other Ambulatory Visit: Payer: Self-pay | Admitting: Orthopedic Surgery

## 2023-07-21 DIAGNOSIS — G4709 Other insomnia: Secondary | ICD-10-CM

## 2023-07-21 LAB — COMPREHENSIVE METABOLIC PANEL
Calcium: 8.4 — AB (ref 8.7–10.7)
eGFR: 46

## 2023-07-21 LAB — BASIC METABOLIC PANEL
BUN: 36 — AB (ref 4–21)
CO2: 27 — AB (ref 13–22)
Chloride: 106 (ref 99–108)
Creatinine: 1.5 — AB (ref 0.6–1.3)
Glucose: 153
Potassium: 4.1 meq/L (ref 3.5–5.1)
Sodium: 141 (ref 137–147)

## 2023-07-21 LAB — VITAMIN B12: Vitamin B-12: 1484

## 2023-07-21 MED ORDER — BELSOMRA 10 MG PO TABS: 10.0000 mg | ORAL_TABLET | Freq: Every day | ORAL | 0 refills | Status: DC

## 2023-07-22 IMAGING — DX DG HIP (WITH OR WITHOUT PELVIS) 2-3V*L*
2 series · 2 of 2 positions shown · non-contrast
Comparison: None.

CLINICAL DATA: Left hip pain.  Chronic left buttock pain.

EXAM:
DG HIP (WITH OR WITHOUT PELVIS) 2-3V LEFT

[dg hip unilat w or w/o pelvis 2-3 views  (1 of 2)]
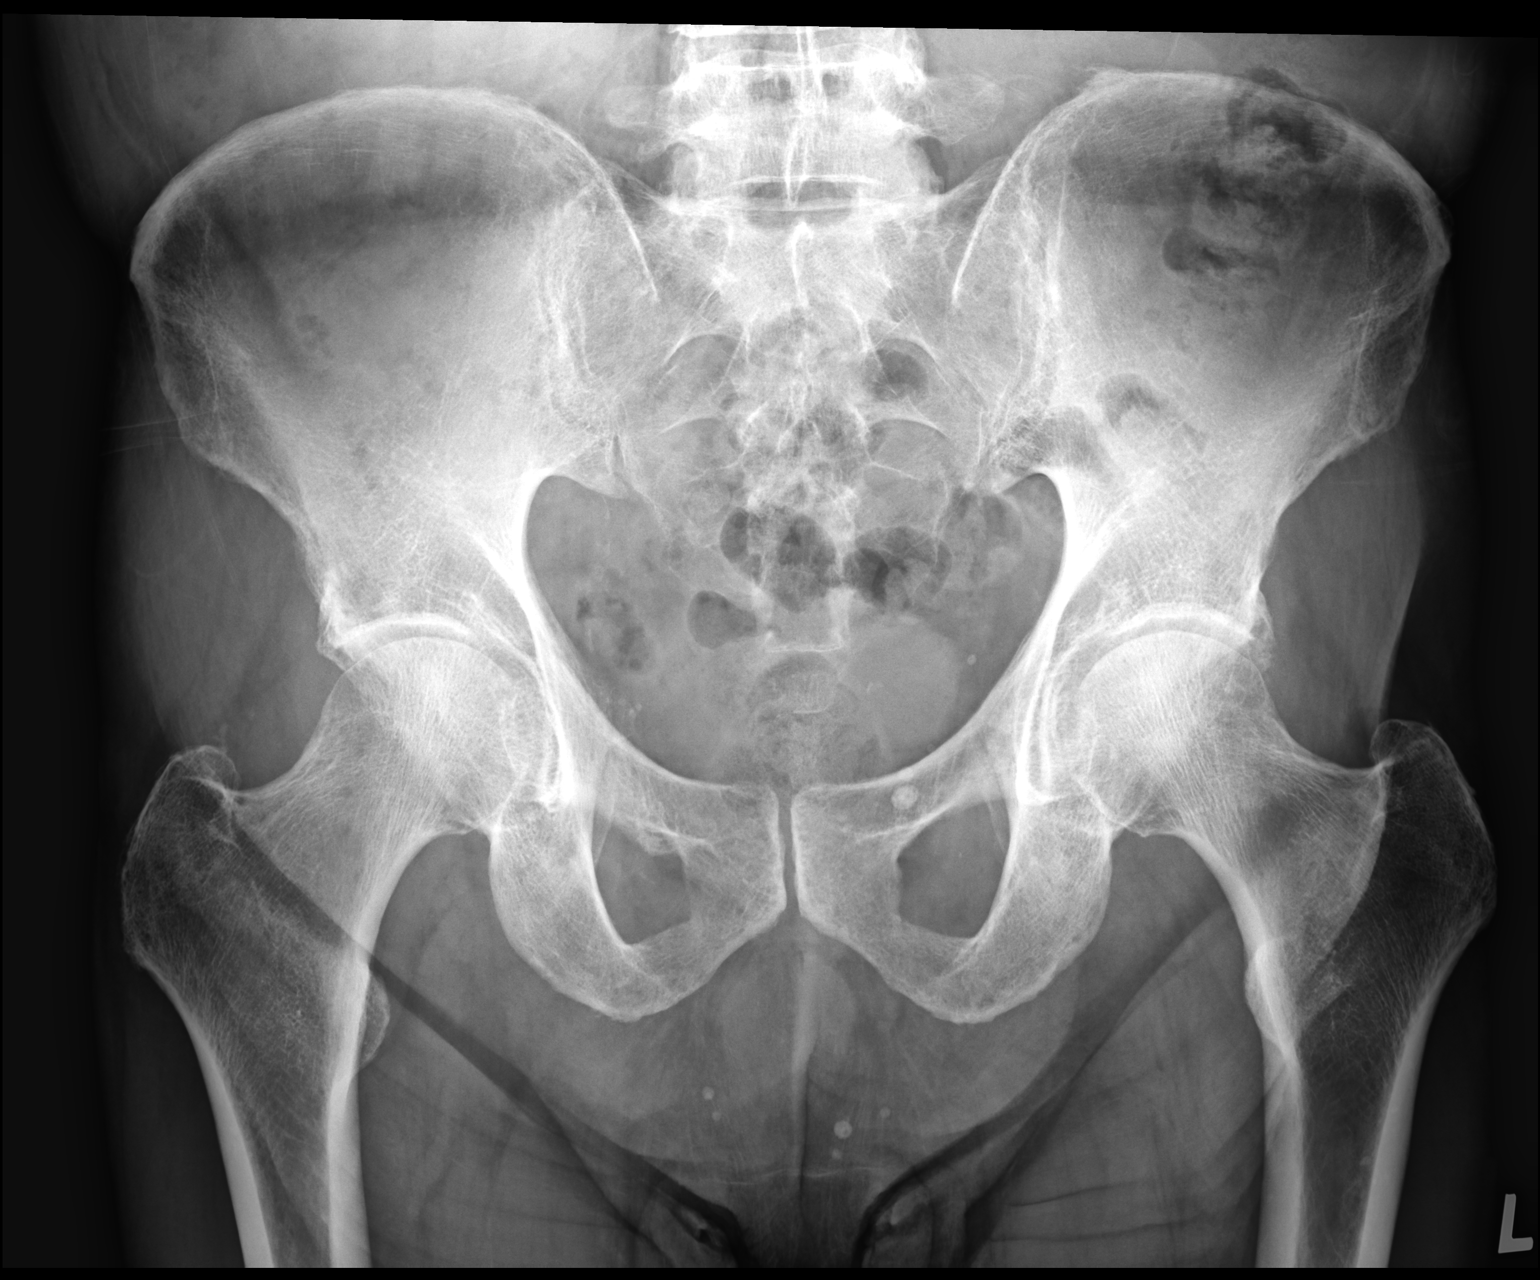

[dg hip unilat w or w/o pelvis 2-3 views  (2 of 2)]
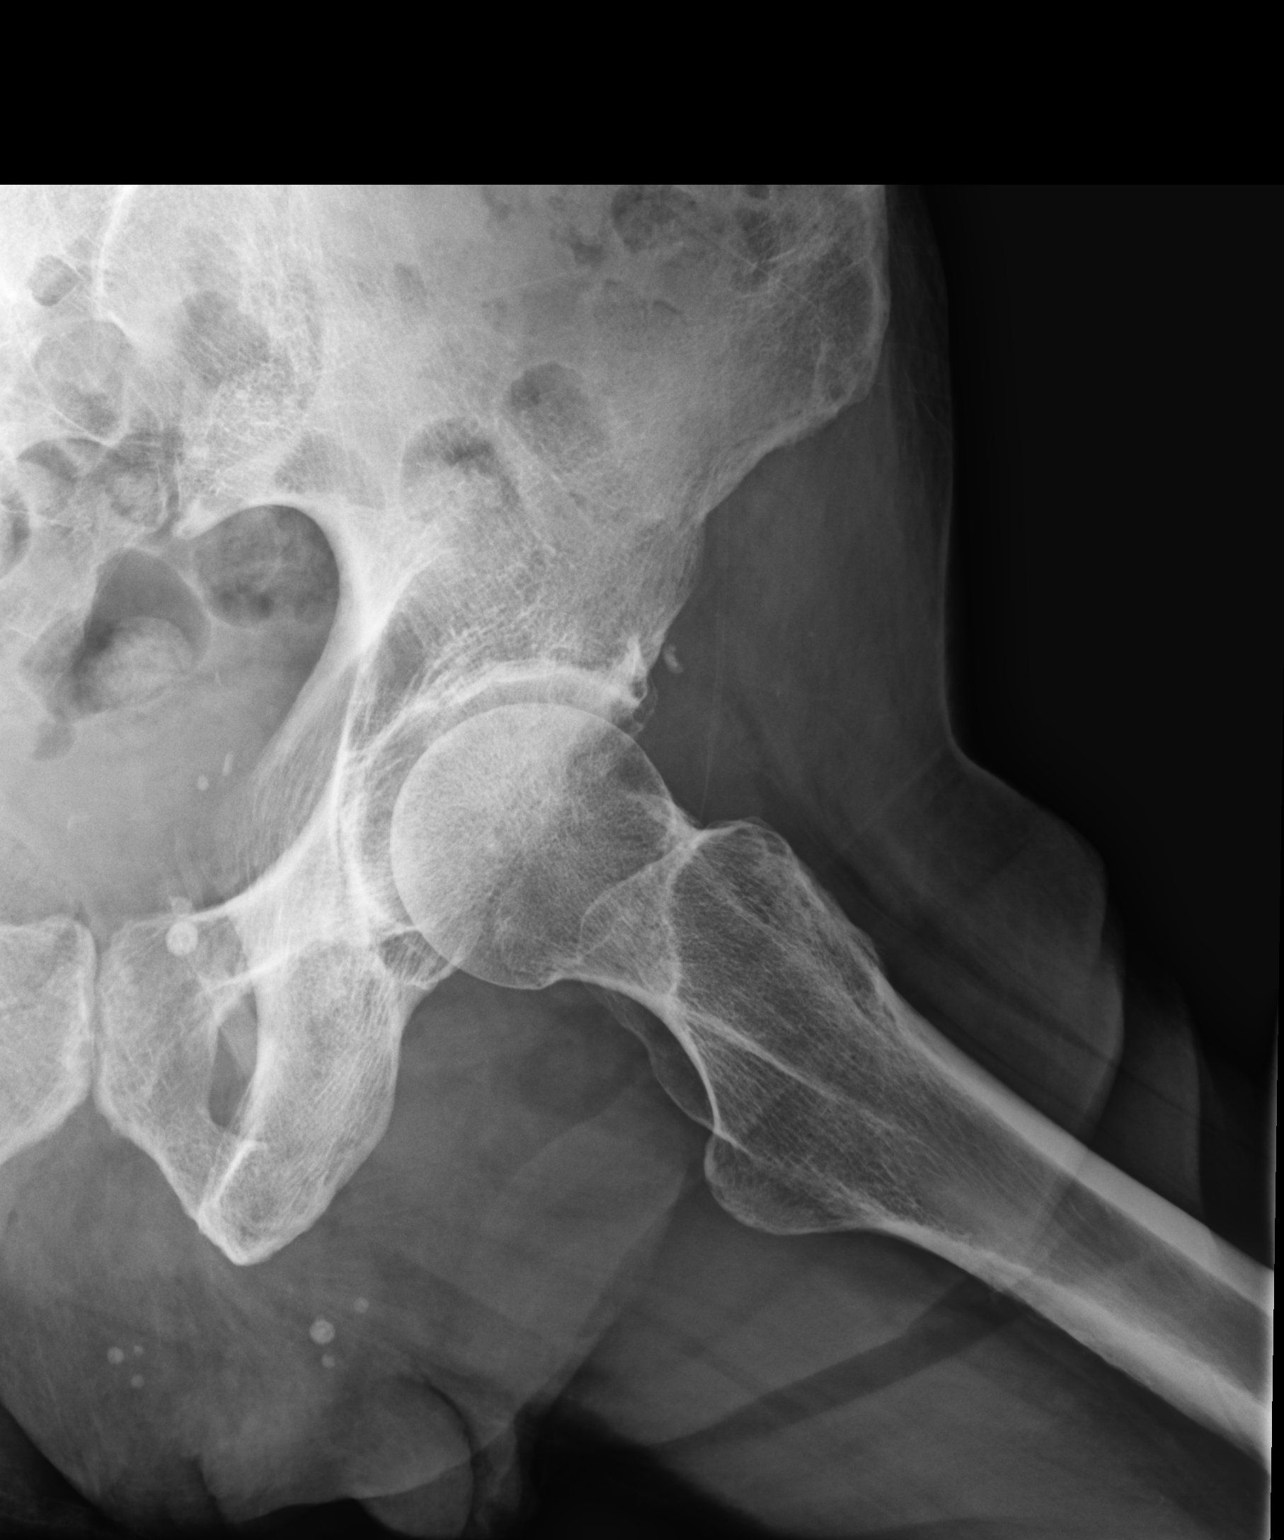

[2 of 2 positions shown; findings below may reference images not displayed]

FINDINGS: Mild left-greater-than-right superolateral acetabular degenerative
osteophytosis. The bilateral sacroiliac, bilateral femoroacetabular,
and pubic symphysis joint spaces are maintained. Vascular
phleboliths are noted overlying the pelvis. No acute fracture or
dislocation.
IMPRESSION: Very mild left femoroacetabular osteoarthritis.

## 2023-07-27 ENCOUNTER — Encounter: Payer: Self-pay | Admitting: Physician Assistant

## 2023-08-10 ENCOUNTER — Non-Acute Institutional Stay (SKILLED_NURSING_FACILITY): Payer: Self-pay | Admitting: Adult Health

## 2023-08-10 ENCOUNTER — Other Ambulatory Visit: Payer: Self-pay | Admitting: Adult Health

## 2023-08-10 ENCOUNTER — Encounter: Payer: Self-pay | Admitting: Adult Health

## 2023-08-10 DIAGNOSIS — R1110 Vomiting, unspecified: Secondary | ICD-10-CM

## 2023-08-10 DIAGNOSIS — I10 Essential (primary) hypertension: Secondary | ICD-10-CM

## 2023-08-10 DIAGNOSIS — N1831 Chronic kidney disease, stage 3a: Secondary | ICD-10-CM | POA: Diagnosis not present

## 2023-08-10 MED ORDER — OMEPRAZOLE MAGNESIUM 20 MG PO TBEC: 20.0000 mg | DELAYED_RELEASE_TABLET | Freq: Every day | ORAL | Status: DC

## 2023-08-10 NOTE — Progress Notes (Signed)
Location:  [facility] Room Number: 309A Place of Service:  SNF [phone]) Provider:  Tamsen Roers, MD  Patient Care Team: Mahlon Gammon, MD as PCP - General (Internal Medicine) Kathleene Hazel, MD as PCP - Cardiology (Cardiology) Drema Dallas, DO as Consulting Physician (Neurology)  Extended Emergency Contact Information Primary Emergency Contact: [facility] Address: [address]          [place], [place] [address] D[place] of [place] Home Phone: [phone] Mobile Phone: [phone] Relation: Daughter Secondary Emergency Contact: [facility] Phone: [phone] Relation: Spouse Preferred language: English Interpreter needed? No  Code Status:  DNR   Goals of care: Advanced Directive information    08/10/2023    9:58 AM  Advanced Directives  Does Patient Have a Medical Advance Directive? Yes  Type of Estate agent of [place];Living will;Out of facility DNR (pink MOST or yellow form)  Does patient want to make changes to medical advance directive? No - Patient declined  Copy of Healthcare Power of Attorney in Chart? No - copy requested  Pre-existing out of facility DNR order (yellow form or pink MOST form) Yellow form placed in chart (order not valid for inpatient use)     Chief Complaint  Patient presents with   Acute Visit    Patient is being seen for acute nausea     HPI:    The patient is an 88 year old male with dementia who presents with intermittent episodes of vomiting.  For about the past month, he has experienced intermittent episodes of vomiting. Initially, his metformin was stopped, followed by Depakote due to concerns of tremor and nausea. A KUB was performed, which did not show any acute process. He was started on a two-week trial of Prilosec, which seemed to help. Several COVID and flu tests have been negative.  His BUN and creatinine levels were  elevated on January 10th, leading to the discontinuation of diuretics. A repeat BMP showed improvement in BUN and creatinine levels.  LBM 2/2 He has lost 6 lbs in the past month CBG variable 86-316   He resides in a skilled memory care unit.  Results LABS BUN: 46 (07/17/2023) Creatinine: 2.0 (07/17/2023) BUN: 35.8 (07/21/2023) Creatinine: 1.49 (07/21/2023) AST: 35 (07/17/2023) ALT: 24 (07/17/2023) Vitamin B12: 1484 (07/17/2023) Lipase: 24 (07/17/2023) Amylase: 41 (07/17/2023)  RADIOLOGY KUB: No acute process    Past Medical History:  Diagnosis Date   CAD (coronary artery disease)    Bare meta stent RCA   Carotid stenosis    a. Carotid US 3/17: bilateral ICA 1-39% >> repeat 2 years   Coronary atherosclerosis of native coronary artery 08/24/2008   S/P PCI   Dementia 12/15/2021   concerns for Alzheimer's disease   Essential hypertension, benign 08/24/2008   Fatigue 08/08/2010   History of nuclear stress test    Myoview 12/16: EF 66%, very mild inferior ischemia; Low Risk   Mixed hyperlipidemia 08/24/2008   Myocardial infarction 10/06/2007   Nonproliferative diabetic retinopathy 08/08/2011   Nuclear sclerosis of both eyes 12/01/2017   Peripheral neuropathy    Pulmonary nodule, right 09/13/2007   Right upper lobe         Sinoatrial node dysfunction 08/24/2008   Chronotropic incompetence   Snoring 09/27/2009   Type II diabetes mellitus 08/08/2011   Past Surgical History:  Procedure Laterality Date   LEFT HEART CATHETERIZATION WITH CORONARY ANGIOGRAM N/A 03/04/2012   Procedure: LEFT HEART CATHETERIZATION WITH CORONARY ANGIOGRAM;  Surgeon: Kathleene Hazel, MD;  Location: [facility] CATH LAB;  Service: Cardiovascular;  Laterality: N/A;   TONSILLECTOMY      Allergies  Allergen Reactions   Ace Inhibitors    Alpha Lipoic Acid [Lipoic Acid]     Upset Stomach   Invokana [Canagliflozin]    Heparin Hives   Isosorbide Mononitrate [Isosorbide Dinitrate Er] Hives    Penicillins Hives   Sulfonamide Derivatives Hives    Outpatient Encounter Medications as of 08/10/2023  Medication Sig   amLODipine (NORVASC) 10 MG tablet TAKE 1 TABLET ONCE DAILY.   aspirin 81 MG tablet Take 1 tablet (81 mg total) by mouth daily.   atorvastatin (LIPITOR) 10 MG tablet Take 10 mg by mouth daily.   citalopram (CELEXA) 20 MG tablet Take 10 mg by mouth daily.   dextromethorphan-guaiFENesin (ROBITUSSIN-DM) 10-100 MG/5ML liquid Take by mouth every 6 (six) hours as needed for cough.   famotidine (PEPCID) 20 MG tablet Take 20 mg by mouth 2 (two) times daily.   hydrALAZINE (APRESOLINE) 10 MG tablet Take 1 tablet (10 mg total) by mouth 3 (three) times daily as needed.   hydrALAZINE (APRESOLINE) 100 MG tablet Take 100 mg by mouth 2 (two) times daily.   hydrOXYzine (VISTARIL) 25 MG capsule Take 1 capsule (25 mg total) by mouth 2 (two) times daily as needed (increased agitation/ sundowning).   hydrOXYzine (VISTARIL) 25 MG capsule Take 25 mg by mouth in the morning and at bedtime. Give at 3 and 5pm   insulin aspart (NOVOLOG) 100 UNIT/ML injection Inject 10 Units into the skin once. With lunch and Dinner Only if CBGS more then 200   LANTUS SOLOSTAR 100 UNIT/ML injection Inject 32 Units into the skin 2 (two) times daily.   latanoprost (XALATAN) 0.005 % ophthalmic solution Place 1 drop into both eyes at bedtime.   memantine (NAMENDA) 10 MG tablet TAKE ONE TABLET BY MOUTH EVERY MORNING and TAKE ONE TABLET BY MOUTH EVERYDAY AT BEDTIME   mirtazapine (REMERON SOL-TAB) 15 MG disintegrating tablet Take 1 tablet (15 mg total) by mouth at bedtime.   ondansetron (ZOFRAN) 4 MG tablet Take 4 mg by mouth every 6 (six) hours as needed for nausea or vomiting.   rivastigmine (EXELON) 1.5 MG capsule TAKE ONE CAPSULE BY MOUTH TWICE DAILY   Suvorexant (BELSOMRA) 10 MG TABS Take 1 tablet (10 mg total) by mouth at bedtime.   [DISCONTINUED] ferrous sulfate 325 (65 FE) MG EC tablet Take 325 mg by mouth 3 (three)  times a week.   [DISCONTINUED] Cyanocobalamin (VITAMIN B 12) 500 MCG TABS Take 2 tablets by mouth every other day. (Patient not taking: Reported on 08/10/2023)   [DISCONTINUED] omeprazole (PRILOSEC OTC) 20 MG tablet Take 1 tablet (20 mg total) by mouth daily for 14 days.   No facility-administered encounter medications on file as of 08/10/2023.    Review of Systems  Constitutional:  Positive for appetite change. Negative for activity change, chills, diaphoresis, fatigue, fever and unexpected weight change.  HENT:  Negative for congestion.   Respiratory:  Negative for cough, shortness of breath, wheezing and stridor.   Cardiovascular:  Negative for chest pain, palpitations and leg swelling.  Gastrointestinal:  Positive for nausea and vomiting. Negative for abdominal distention, abdominal pain, constipation and diarrhea.  Genitourinary:  Negative for difficulty urinating and dysuria.  Musculoskeletal:  Positive for gait problem. Negative for arthralgias, back pain, joint swelling and myalgias.  Neurological:  Negative for dizziness, seizures, syncope, facial asymmetry, speech difficulty, weakness and headaches.  Hematological:  Negative for adenopathy. Does not bruise/bleed easily.  Psychiatric/Behavioral:  Positive for agitation and confusion. Negative for behavioral problems.     Immunization History  Administered Date(s) Administered   Fluad Quad(high Dose 65+) 04/08/2022, 04/28/2023   Influenza Split 05/01/2010, 03/11/2011, 04/06/2012, 04/21/2013, 05/03/2021   Influenza Whole 03/07/2009   Influenza, High Dose Seasonal PF 03/29/2014, 04/02/2015, 03/31/2016, 04/08/2017, 04/16/2018   Influenza-Unspecified 04/16/2019, 03/29/2020   Moderna Covid-19 Fall Seasonal Vaccine 68yrs & older 05/07/2022   Moderna Covid-19 Vaccine Bivalent Booster 37yrs & up 04/28/2023   Moderna Sars-Covid-2 Vaccination 07/19/2019, 08/17/2019, 06/01/2020, 12/09/2021   Pneumococcal Conjugate-13 03/29/2014   Pneumococcal  Polysaccharide-23 10/31/2003, 09/07/2006, 03/25/2013   Td (Adult) 10/30/1993   Tdap 07/07/2010, 03/19/2011, 04/16/2023   Zoster Recombinant(Shingrix) 10/24/2016, 12/24/2016   Zoster, Live 10/30/2005, 10/21/2016, 12/24/2016   Zoster, Unspecified 10/24/2016   Pertinent  Health Maintenance Due  Topic Date Due   OPHTHALMOLOGY EXAM  07/30/2023   FOOT EXAM  10/07/2023   HEMOGLOBIN A1C  01/06/2024   INFLUENZA VACCINE  Completed      03/31/2023    1:05 PM 04/09/2023    4:03 PM 04/20/2023    4:07 PM 05/22/2023   10:57 AM 07/20/2023    1:17 PM  Fall Risk  Falls in the past year? 1 1 0 0 0  Was there an injury with Fall? 1 1 0 0 0  Fall Risk Category Calculator 3 2 0 0 0  Patient at Risk for Falls Due to  History of fall(s) No Fall Risks No Fall Risks No Fall Risks  Fall risk Follow up Falls evaluation completed Falls evaluation completed Falls evaluation completed Falls evaluation completed Falls evaluation completed   Functional Status Survey:    Vitals:   08/10/23 0952 08/10/23 0953  BP: (!) 162/84 (!) 165/79  Pulse: 60   Resp: 16   Temp: 97.9 F (36.6 C)   TempSrc: Temporal   SpO2: 97%   Weight: 176 lb 9.6 oz (80.1 kg)   Height: 6\' 2"  (1.88 m)    Body mass index is 22.67 kg/m. Wt Readings from Last 3 Encounters:  08/10/23 176 lb 9.6 oz (80.1 kg)  07/20/23 182 lb 3.2 oz (82.6 kg)  07/16/23 182 lb 3.2 oz (82.6 kg)    Physical Exam Constitutional:      General: He is not in acute distress.    Appearance: He is not diaphoretic.  HENT:     Head: Normocephalic and atraumatic.     Mouth/Throat:     Mouth: Mucous membranes are moist.     Pharynx: Oropharynx is clear.  Eyes:     Conjunctiva/sclera: Conjunctivae normal.     Pupils: Pupils are equal, round, and reactive to light.  Neck:     Thyroid: No thyromegaly.     Vascular: No JVD.     Trachea: No tracheal deviation.  Cardiovascular:     Rate and Rhythm: Normal rate and regular rhythm.     Heart sounds: No murmur  heard. Pulmonary:     Effort: Pulmonary effort is normal. No respiratory distress.     Breath sounds: Normal breath sounds. No wheezing.  Abdominal:     General: Bowel sounds are normal. There is no distension.     Palpations: Abdomen is soft.     Tenderness: There is no abdominal tenderness. There is guarding. There is no right CVA tenderness or left CVA tenderness.     Comments: Ecchymosis to RUQ and LUQ  Musculoskeletal:  Right lower leg: No edema.     Left lower leg: No edema.  Lymphadenopathy:     Cervical: No cervical adenopathy.  Skin:    General: Skin is warm and dry.  Neurological:     General: No focal deficit present.     Mental Status: He is alert. Mental status is at baseline.  Psychiatric:        Mood and Affect: Mood normal.     Labs reviewed: Recent Labs    01/14/23 0410 02/24/23 0000 07/21/23 0000  NA 140 142 141  K 4.3 4.5 4.1  CL 107 106 106  CO2 23 24* 27*  GLUCOSE 93  --   --   BUN 30* 33* 36*  CREATININE 1.61* 1.4* 1.5*  CALCIUM 9.1 9.0 8.4*   Recent Labs    08/19/22 0000 02/24/23 0000  AST 27 32  ALT 22 25  ALKPHOS  --  104  ALBUMIN 4.0 3.9   Recent Labs    08/19/22 0000 01/14/23 0410 02/24/23 0000  WBC 6.0 8.5 5.7  NEUTROABS  --  6.7  --   HGB 12.5* 12.1* 11.9*  HCT 37* 35.9* 36*  MCV  --  94.7  --   PLT 261 217 294   Lab Results  Component Value Date   TSH 2.58 08/19/2022   Lab Results  Component Value Date   HGBA1C 8.3 07/09/2023   Lab Results  Component Value Date   CHOL 136 08/19/2022   HDL 37 08/19/2022   LDLCALC 80 08/19/2022   TRIG 96 08/19/2022   CHOLHDL 3.0 09/04/2007    Significant Diagnostic Results in last 30 days:  No results found.  Assessment and Plan  Intermittent Vomiting Episodes over the past month. Metformin and Depakote discontinued due to concerns of tremor and nausea/vomiting. KUB did not show any acute process. Prilosec trial seemed to help. All COVID and flu tests negative. -Order  right upper quadrant ultrasound to evaluate gallbladder. -Discontinue iron supplementation. -Resume Prilosec 20mg  daily. -consider gastroparesis in the differential if ultrasound negative.   Hypertension Uncontrolled since discontinuation of diuretics due to elevated BUN and creatinine. -Increase Hydralazine to 10mg  three times a day.  Chronic Kidney Disease (CKD) Stage 3 Elevated BUN and creatinine on January 10th. Diuretics discontinued and repeat BMP showed improvement. -Monitor renal function closely.  Labs/tests ordered:  Abd [place]

## 2023-08-10 NOTE — Progress Notes (Signed)
 Erroneous

## 2023-08-13 ENCOUNTER — Non-Acute Institutional Stay (SKILLED_NURSING_FACILITY): Payer: PPO | Admitting: Adult Health

## 2023-08-13 ENCOUNTER — Encounter: Payer: Self-pay | Admitting: Adult Health

## 2023-08-13 DIAGNOSIS — R1110 Vomiting, unspecified: Secondary | ICD-10-CM

## 2023-08-13 DIAGNOSIS — E1169 Type 2 diabetes mellitus with other specified complication: Secondary | ICD-10-CM | POA: Diagnosis not present

## 2023-08-13 DIAGNOSIS — I10 Essential (primary) hypertension: Secondary | ICD-10-CM | POA: Diagnosis not present

## 2023-08-13 DIAGNOSIS — Z794 Long term (current) use of insulin: Secondary | ICD-10-CM

## 2023-08-13 MED ORDER — METOCLOPRAMIDE HCL 5 MG PO TABS: 2.5000 mg | ORAL_TABLET | Freq: Three times a day (TID) | ORAL | Status: DC

## 2023-08-13 MED ORDER — HYDRALAZINE HCL 10 MG PO TABS: 10.0000 mg | ORAL_TABLET | Freq: Three times a day (TID) | ORAL | Status: DC

## 2023-08-13 NOTE — Addendum Note (Signed)
 Addended by: Americo Baker on: 08/13/2023 03:44 PM   Modules accepted: Orders

## 2023-08-13 NOTE — Progress Notes (Addendum)
 Location:  Medical Illustrator of Service:  SNF (31) Provider:   Bari America, ANP [facility] [phone]   Charlanne Fredia CROME, MD  Patient Care Team: Charlanne Fredia CROME, MD as PCP - General (Internal Medicine) Verlin Lonni BIRCH, MD as PCP - Cardiology (Cardiology) Skeet Juliene SAUNDERS, DO as Consulting Physician (Neurology)  Extended Emergency Contact Information Primary Emergency Contact: [facility] Address: [address]          [place], [place] [address] [place] Home Phone: [phone] Mobile Phone: [phone] Relation: Daughter Secondary Emergency Contact: [facility] Phone: [phone] Relation: Spouse Preferred language: English Interpreter needed? No  Code Status:  DNR Goals of care: Advanced Directive information    08/10/2023    9:58 AM  Advanced Directives  Does Patient Have a Medical Advance Directive? Yes  Type of Estate Agent of [place];Living will;Out of facility DNR (pink MOST or yellow form)  Does patient want to make changes to medical advance directive? No - Patient declined  Copy of Healthcare Power of Attorney in Chart? No - copy requested  Pre-existing out of facility DNR order (yellow form or pink MOST form) Yellow form placed in chart (order not valid for inpatient use)     Chief Complaint  Patient presents with   Acute Visit    F/u vomiting    HPI:  Pt is a 88 y.o. male seen today for an acute visit for vomiting  History of Present Illness The patient is an 88 year old male with dementia who presents with intermittent vomiting.   He has been experiencing intermittent vomiting, which prompted an abdominal ultrasound. The ultrasound revealed a questionable finding in the common bile duct, but no gallstones or sludge were detected. The gallbladder visualization was limited by gas, and no significant abnormalities were noted in the liver, right kidney, or  pancreas. He has not had any further episodes of vomiting.  Over the past two months, he has experienced a weight loss of 13 pounds. This weight loss is concerning and may be related to his gastrointestinal issues.  He has diabetes, and his blood glucose levels have been fluctuating. His CBG this morning was 69, and it has been 88 in the mornings previously. In the evenings, his CBG ranges from 155 to 290. His A1c on July 10, 2023, was 8.3, up from 7.7 previously.  He has not had a bowel movement in the past three days   RADIOLOGY Abdominal ultrasound: Questionable finding in the common bile duct. No gallstones or sludge detected. Gallbladder limited to gas. No significant abnormalities in the liver, right kidney, or pancreas.  A KUB was performed, which did not show any acute process. He was started on a trial of Prilosec , which seemed to help. Several COVID and flu tests have been negative.  Tremor has improved with reduce dose of Celexa  Past Medical History:  Diagnosis Date   CAD (coronary artery disease)    Bare meta stent RCA   Carotid stenosis    a. Carotid US  3/17: bilateral ICA 1-39% >> repeat 2 years   Coronary atherosclerosis of native coronary artery 08/24/2008   S/P PCI   Dementia 12/15/2021   concerns for Alzheimer's disease   Essential hypertension, benign 08/24/2008   Fatigue 08/08/2010   History of nuclear stress test    Myoview  12/16: EF 66%, very mild inferior ischemia; Low Risk   Mixed hyperlipidemia 08/24/2008   Myocardial infarction 10/06/2007   Nonproliferative  diabetic retinopathy 08/08/2011   Nuclear sclerosis of both eyes 12/01/2017   Peripheral neuropathy    Pulmonary nodule, right 09/13/2007   Right upper lobe         Sinoatrial node dysfunction 08/24/2008   Chronotropic incompetence   Snoring 09/27/2009   Type II diabetes mellitus 08/08/2011   Past Surgical History:  Procedure Laterality Date   LEFT HEART CATHETERIZATION WITH CORONARY  ANGIOGRAM N/A 03/04/2012   Procedure: LEFT HEART CATHETERIZATION WITH CORONARY ANGIOGRAM;  Surgeon: Lonni JONETTA Cash, MD;  Location: [facility] CATH LAB;  Service: Cardiovascular;  Laterality: N/A;   TONSILLECTOMY      Allergies  Allergen Reactions   Ace Inhibitors    Alpha Lipoic Acid [Lipoic Acid]     Upset Stomach   Invokana [Canagliflozin]    Heparin  Hives   Isosorbide  Mononitrate [Isosorbide  Dinitrate Er] Hives   Penicillins Hives   Sulfonamide Derivatives Hives    Outpatient Encounter Medications as of 08/13/2023  Medication Sig   hydrALAZINE  (APRESOLINE ) 10 MG tablet Take 1 tablet (10 mg total) by mouth 3 (three) times daily.   metoCLOPramide  (REGLAN ) 5 MG tablet Take 0.5 tablets (2.5 mg total) by mouth 3 (three) times daily before meals.   amLODipine  (NORVASC ) 10 MG tablet TAKE 1 TABLET ONCE DAILY.   aspirin  81 MG tablet Take 1 tablet (81 mg total) by mouth daily.   atorvastatin (LIPITOR) 10 MG tablet Take 10 mg by mouth daily.   citalopram (CELEXA) 20 MG tablet Take 10 mg by mouth daily.   dextromethorphan-guaiFENesin (ROBITUSSIN-DM) 10-100 MG/5ML liquid Take by mouth every 6 (six) hours as needed for cough.   famotidine (PEPCID) 20 MG tablet Take 20 mg by mouth 2 (two) times daily.   hydrALAZINE  (APRESOLINE ) 10 MG tablet Take 1 tablet (10 mg total) by mouth 3 (three) times daily as needed.   hydrOXYzine  (VISTARIL ) 25 MG capsule Take 1 capsule (25 mg total) by mouth 2 (two) times daily as needed (increased agitation/ sundowning).   hydrOXYzine  (VISTARIL ) 25 MG capsule Take 25 mg by mouth in the morning and at bedtime. Give at 3 and 5pm   insulin  aspart (NOVOLOG) 100 UNIT/ML injection Inject 7 Units into the skin once. With lunch and Dinner Only if CBGS more then 200   LANTUS  SOLOSTAR 100 UNIT/ML injection Inject 32 Units into the skin daily.   latanoprost (XALATAN) 0.005 % ophthalmic solution Place 1 drop into both eyes at bedtime.   memantine  (NAMENDA ) 10 MG tablet TAKE ONE  TABLET BY MOUTH EVERY MORNING and TAKE ONE TABLET BY MOUTH EVERYDAY AT BEDTIME   mirtazapine  (REMERON  SOL-TAB) 15 MG disintegrating tablet Take 1 tablet (15 mg total) by mouth at bedtime.   omeprazole  (PRILOSEC  OTC) 20 MG tablet Take 1 tablet (20 mg total) by mouth daily.   ondansetron  (ZOFRAN ) 4 MG tablet Take 4 mg by mouth every 6 (six) hours as needed for nausea or vomiting.   rivastigmine  (EXELON ) 1.5 MG capsule TAKE ONE CAPSULE BY MOUTH TWICE DAILY   Suvorexant  (BELSOMRA ) 10 MG TABS Take 1 tablet (10 mg total) by mouth at bedtime.   [DISCONTINUED] hydrALAZINE  (APRESOLINE ) 100 MG tablet Take 100 mg by mouth 2 (two) times daily.   No facility-administered encounter medications on file as of 08/13/2023.    Review of Systems  Constitutional:  Positive for appetite change. Negative for activity change, chills, diaphoresis and fever.  Respiratory:  Negative for cough and wheezing.   Cardiovascular:  Negative for chest pain and leg swelling.  Gastrointestinal:  Positive for nausea and vomiting. Negative for abdominal pain, constipation and diarrhea.  Genitourinary:  Negative for dysuria and urgency.  Musculoskeletal:  Positive for gait problem. Negative for back pain, myalgias and neck pain.  Skin:  Negative for rash.  Neurological:  Negative for weakness.  Psychiatric/Behavioral:  Positive for confusion.     Immunization History  Administered Date(s) Administered   Fluad  Quad(high Dose 65+) 04/08/2022, 04/28/2023   Influenza Split 05/01/2010, 03/11/2011, 04/06/2012, 04/21/2013, 05/03/2021   Influenza Whole 03/07/2009   Influenza, High Dose Seasonal PF 03/29/2014, 04/02/2015, 03/31/2016, 04/08/2017, 04/16/2018   Influenza-Unspecified 04/16/2019, 03/29/2020   Moderna Covid-19 Fall Seasonal Vaccine 11yrs & older 05/07/2022   Moderna Covid-19 Vaccine Bivalent Booster 19yrs & up 04/28/2023   Moderna Sars-Covid-2 Vaccination 07/19/2019, 08/17/2019, 06/01/2020, 12/09/2021   Pneumococcal  Conjugate-13 03/29/2014   Pneumococcal Polysaccharide-23 10/31/2003, 09/07/2006, 03/25/2013   Td (Adult) 10/30/1993   Tdap 07/07/2010, 03/19/2011, 04/16/2023   Zoster Recombinant(Shingrix) 10/24/2016, 12/24/2016   Zoster, Live 10/30/2005, 10/21/2016, 12/24/2016   Zoster, Unspecified 10/24/2016   Pertinent  Health Maintenance Due  Topic Date Due   OPHTHALMOLOGY EXAM  07/30/2023   FOOT EXAM  10/07/2023   HEMOGLOBIN A1C  01/06/2024   INFLUENZA VACCINE  Completed      03/31/2023    1:05 PM 04/09/2023    4:03 PM 04/20/2023    4:07 PM 05/22/2023   10:57 AM 07/20/2023    1:17 PM  Fall Risk  Falls in the past year? 1 1 0 0 0  Was there an injury with Fall? 1 1 0 0 0  Fall Risk Category Calculator 3 2 0 0 0  Patient at Risk for Falls Due to  History of fall(s) No Fall Risks No Fall Risks No Fall Risks  Fall risk Follow up Falls evaluation completed Falls evaluation completed Falls evaluation completed Falls evaluation completed Falls evaluation completed   Functional Status Survey:    Vitals:   08/13/23 1527 08/13/23 1530  BP: (!) 162/80 (!) 146/84  Pulse: 62   Resp: 16   Temp: 98.2 F (36.8 C)   SpO2: 100%    There is no height or weight on file to calculate BMI. Physical Exam Vitals and nursing note reviewed.  Constitutional:      General: He is not in acute distress.    Appearance: He is not diaphoretic.  HENT:     Head: Normocephalic and atraumatic.     Mouth/Throat:     Mouth: Mucous membranes are moist.     Pharynx: Oropharynx is clear.  Neck:     Thyroid : No thyromegaly.     Vascular: No JVD.     Trachea: No tracheal deviation.  Cardiovascular:     Rate and Rhythm: Normal rate and regular rhythm.     Heart sounds: No murmur heard. Pulmonary:     Effort: Pulmonary effort is normal. No respiratory distress.     Breath sounds: Normal breath sounds. No wheezing.  Abdominal:     General: Bowel sounds are normal. There is no distension.     Palpations: Abdomen is  soft.     Tenderness: There is no abdominal tenderness.  Musculoskeletal:     Cervical back: Normal range of motion and neck supple.  Lymphadenopathy:     Cervical: No cervical adenopathy.  Skin:    General: Skin is warm and dry.  Neurological:     General: No focal deficit present.     Mental Status: He is alert. Mental status  is at baseline.     Labs reviewed: Recent Labs    01/14/23 0410 02/24/23 0000 07/21/23 0000  NA 140 142 141  K 4.3 4.5 4.1  CL 107 106 106  CO2 23 24* 27*  GLUCOSE 93  --   --   BUN 30* 33* 36*  CREATININE 1.61* 1.4* 1.5*  CALCIUM 9.1 9.0 8.4*   Recent Labs    08/19/22 0000 02/24/23 0000  AST 27 32  ALT 22 25  ALKPHOS  --  104  ALBUMIN 4.0 3.9   Recent Labs    08/19/22 0000 01/14/23 0410 02/24/23 0000  WBC 6.0 8.5 5.7  NEUTROABS  --  6.7  --   HGB 12.5* 12.1* 11.9*  HCT 37* 35.9* 36*  MCV  --  94.7  --   PLT 261 217 294   Lab Results  Component Value Date   TSH 2.58 08/19/2022   Lab Results  Component Value Date   HGBA1C 8.3 07/09/2023   Lab Results  Component Value Date   CHOL 136 08/19/2022   HDL 37 08/19/2022   LDLCALC 80 08/19/2022   TRIG 96 08/19/2022   CHOLHDL 3.0 09/04/2007    Significant Diagnostic Results in last 30 days:  No results found.  Assessment/Plan  Intermittent Vomiting and Weight Loss  Intermittent vomiting continues despite stopping depakote , diuretics, and metformin. Recent weight loss of 13 pounds over 2 months. Abdominal ultrasound showed a questionable finding in the common bile duct but no gallstones, sludge, or significant abnormalities in the liver, right kidney, or pancreas. Normal LFTs. No fever or abdominal tenderness. Possible diabetic gastroparesis. -Start Reglan  2.5mg  TID to manage vomiting and promote bowel movements. -Monitor AIMS score  Type 2 Diabetes Morning hypoglycemia (CBG 69-88) and elevated evening glucose levels (155-290). A1c was 8.3 on 07/10/2023, increased from  7.7. -Reduce Novolog to 7 units at lunch and dinner if CBG >200.  Hypertension Elevated morning blood pressure (162/80), improved to 146/84 after hydralazine . -Continue current management and monitor blood pressure.  Constipation No bowel movement in the past three days. Reglan  will help with this   Family/ staff Communication: discussed with daughter Earnie that he is not a surgical candidate and therefore further work up would not necessary be beneficial right now given that he does not have a fever and normal labs. She agreed that we could try the Reglan  and monitor his weight and symptoms. Discussed risk vs benefit.   Labs/tests ordered:  NA

## 2023-08-20 ENCOUNTER — Other Ambulatory Visit: Payer: Self-pay | Admitting: Adult Health

## 2023-08-20 DIAGNOSIS — G4709 Other insomnia: Secondary | ICD-10-CM

## 2023-08-20 MED ORDER — BELSOMRA 10 MG PO TABS: 10.0000 mg | ORAL_TABLET | Freq: Every day | ORAL | 3 refills | Status: DC

## 2023-08-24 ENCOUNTER — Ambulatory Visit: Payer: PPO | Admitting: Physician Assistant

## 2023-09-03 ENCOUNTER — Encounter: Payer: Self-pay | Admitting: Adult Health

## 2023-09-03 ENCOUNTER — Non-Acute Institutional Stay (SKILLED_NURSING_FACILITY): Payer: Self-pay | Admitting: Adult Health

## 2023-09-03 DIAGNOSIS — I10 Essential (primary) hypertension: Secondary | ICD-10-CM

## 2023-09-03 DIAGNOSIS — K5901 Slow transit constipation: Secondary | ICD-10-CM

## 2023-09-03 DIAGNOSIS — R1110 Vomiting, unspecified: Secondary | ICD-10-CM | POA: Diagnosis not present

## 2023-09-03 DIAGNOSIS — G309 Alzheimer's disease, unspecified: Secondary | ICD-10-CM

## 2023-09-03 DIAGNOSIS — F02818 Dementia in other diseases classified elsewhere, unspecified severity, with other behavioral disturbance: Secondary | ICD-10-CM

## 2023-09-03 MED ORDER — HYDRALAZINE HCL 25 MG PO TABS: 25.0000 mg | ORAL_TABLET | Freq: Three times a day (TID) | ORAL | Status: DC

## 2023-09-03 MED ORDER — POLYETHYLENE GLYCOL 3350 17 G PO PACK
17.0000 g | PACK | Freq: Every day | ORAL | Status: AC
Start: 2023-09-03 — End: ?

## 2023-09-03 MED ORDER — CARBAMAZEPINE 100 MG/5ML PO SUSP: 100.0000 mg | Freq: Two times a day (BID) | ORAL | Status: DC

## 2023-09-03 NOTE — Progress Notes (Signed)
 Location:  Medical illustrator of Service:  SNF (31) Provider:   Peggye Ley, ANP [facility] [phone]   Mahlon Gammon, MD  Patient Care Team: Mahlon Gammon, MD as PCP - General (Internal Medicine) Kathleene Hazel, MD as PCP - Cardiology (Cardiology) Drema Dallas, DO as Consulting Physician (Neurology)  Extended Emergency Contact Information Primary Emergency Contact: [facility] Address: [address]          [place], [place] [address] [place] Home Phone: [phone] Mobile Phone: [phone] Relation: Daughter Secondary Emergency Contact: [facility] Phone: [phone] Relation: Spouse Preferred language: English Interpreter needed? No  Code Status:  DNR Goals of care: Advanced Directive information    08/10/2023    9:58 AM  Advanced Directives  Does Patient Have a Medical Advance Directive? Yes  Type of Estate agent of [place];Living will;Out of facility DNR (pink MOST or yellow form)  Does patient want to make changes to medical advance directive? No - Patient declined  Copy of Healthcare Power of Attorney in Chart? No - copy requested  Pre-existing out of facility DNR order (yellow form or pink MOST form) Yellow form placed in chart (order not valid for inpatient use)     Chief Complaint  Patient presents with   Acute Visit    HTN and f/u vomiting    HPI:  Pt is a 88 y.o. male seen today for an acute visit for HTN and vomiting  He resides in memory care due to advancing dementia.   He has been experiencing intermittent vomiting, which prompted an abdominal ultrasound. The ultrasound revealed a questionable finding in the common bile duct, but no gallstones or sludge were detected. The gallbladder visualization was limited by gas, and no significant abnormalities were noted in the liver, right kidney, or pancreas. He has not had any further episodes of  vomiting.  He has been losing weight and his dementia has been progressing. He requires more assistance, reminders, and a lift for transfers. Agitation has been an issue. Tegretol was added to help control his behaviors and celexa was increased back to 20 mg by Dr Donell Beers   The nurse reports he is having some constipation  Also he had a vomiting episode on 2/24 after dinner. This has been an ongoing issue and possibly due to gastroparesis. He was started on Reglan on 2/6.  Vomiting episodes have reduced. He was able to eat well today with no issues. No pain or fever.  KUB at prior visit was negative also.   The nurse reports his blood pressure is frequently elevated over 160 systolic.   Past Medical History:  Diagnosis Date   CAD (coronary artery disease)    Bare meta stent RCA   Carotid stenosis    a. Carotid US 3/17: bilateral ICA 1-39% >> repeat 2 years   Coronary atherosclerosis of native coronary artery 08/24/2008   S/P PCI   Dementia 12/15/2021   concerns for Alzheimer's disease   Essential hypertension, benign 08/24/2008   Fatigue 08/08/2010   History of nuclear stress test    Myoview 12/16: EF 66%, very mild inferior ischemia; Low Risk   Mixed hyperlipidemia 08/24/2008   Myocardial infarction 10/06/2007   Nonproliferative diabetic retinopathy 08/08/2011   Nuclear sclerosis of both eyes 12/01/2017   Peripheral neuropathy    Pulmonary nodule, right 09/13/2007   Right upper lobe         Sinoatrial node dysfunction 08/24/2008  Chronotropic incompetence   Snoring 09/27/2009   Type II diabetes mellitus 08/08/2011   Past Surgical History:  Procedure Laterality Date   LEFT HEART CATHETERIZATION WITH CORONARY ANGIOGRAM N/A 03/04/2012   Procedure: LEFT HEART CATHETERIZATION WITH CORONARY ANGIOGRAM;  Surgeon: Kathleene Hazel, MD;  Location: [facility] CATH LAB;  Service: Cardiovascular;  Laterality: N/A;   TONSILLECTOMY      Allergies  Allergen Reactions   Ace Inhibitors     Alpha Lipoic Acid [Lipoic Acid]     Upset Stomach   Invokana [Canagliflozin]    Heparin Hives   Isosorbide Mononitrate [Isosorbide Dinitrate Er] Hives   Penicillins Hives   Sulfonamide Derivatives Hives    Outpatient Encounter Medications as of 09/03/2023  Medication Sig   carBAMazepine (TEGRETOL) 100 MG/5ML suspension Take 5 mLs (100 mg total) by mouth 2 (two) times daily.   polyethylene glycol (MIRALAX) 17 g packet Take 17 g by mouth daily.   amLODipine (NORVASC) 10 MG tablet TAKE 1 TABLET ONCE DAILY.   aspirin 81 MG tablet Take 1 tablet (81 mg total) by mouth daily.   atorvastatin (LIPITOR) 10 MG tablet Take 10 mg by mouth daily.   citalopram (CELEXA) 20 MG tablet Take 20 mg by mouth daily.   dextromethorphan-guaiFENesin (ROBITUSSIN-DM) 10-100 MG/5ML liquid Take by mouth every 6 (six) hours as needed for cough.   famotidine (PEPCID) 20 MG tablet Take 20 mg by mouth 2 (two) times daily.   hydrALAZINE (APRESOLINE) 10 MG tablet Take 1 tablet (10 mg total) by mouth 3 (three) times daily as needed.   hydrALAZINE (APRESOLINE) 25 MG tablet Take 1 tablet (25 mg total) by mouth 3 (three) times daily.   hydrOXYzine (VISTARIL) 25 MG capsule Take 1 capsule (25 mg total) by mouth 2 (two) times daily as needed (increased agitation/ sundowning).   hydrOXYzine (VISTARIL) 25 MG capsule Take 25 mg by mouth in the morning and at bedtime. Give at 3 and 5pm   insulin aspart (NOVOLOG) 100 UNIT/ML injection Inject 7 Units into the skin once. With lunch and Dinner Only if CBGS more then 200   LANTUS SOLOSTAR 100 UNIT/ML injection Inject 32 Units into the skin daily.   latanoprost (XALATAN) 0.005 % ophthalmic solution Place 1 drop into both eyes at bedtime.   memantine (NAMENDA) 10 MG tablet TAKE ONE TABLET BY MOUTH EVERY MORNING and TAKE ONE TABLET BY MOUTH EVERYDAY AT BEDTIME   metoCLOPramide (REGLAN) 5 MG tablet Take 0.5 tablets (2.5 mg total) by mouth 3 (three) times daily before meals.   mirtazapine  (REMERON SOL-TAB) 15 MG disintegrating tablet Take 1 tablet (15 mg total) by mouth at bedtime.   omeprazole (PRILOSEC OTC) 20 MG tablet Take 1 tablet (20 mg total) by mouth daily.   ondansetron (ZOFRAN) 4 MG tablet Take 4 mg by mouth every 6 (six) hours as needed for nausea or vomiting.   rivastigmine (EXELON) 1.5 MG capsule TAKE ONE CAPSULE BY MOUTH TWICE DAILY   Suvorexant (BELSOMRA) 10 MG TABS Take 1 tablet (10 mg total) by mouth at bedtime.   [DISCONTINUED] hydrALAZINE (APRESOLINE) 10 MG tablet Take 1 tablet (10 mg total) by mouth 3 (three) times daily.   No facility-administered encounter medications on file as of 09/03/2023.    Review of Systems  Constitutional:  Positive for appetite change. Negative for activity change, chills, diaphoresis and fever.  Respiratory:  Negative for cough and wheezing.   Cardiovascular:  Negative for chest pain and leg swelling.  Gastrointestinal:  Positive for constipation  and vomiting. Negative for abdominal pain, diarrhea and nausea.  Genitourinary:  Negative for dysuria and urgency.  Musculoskeletal:  Positive for gait problem. Negative for back pain, myalgias and neck pain.  Skin:  Negative for rash.  Neurological:  Negative for weakness.  Psychiatric/Behavioral:  Positive for confusion.     Immunization History  Administered Date(s) Administered   Fluad Quad(high Dose 65+) 04/08/2022, 04/28/2023   Influenza Split 05/01/2010, 03/11/2011, 04/06/2012, 04/21/2013, 05/03/2021   Influenza Whole 03/07/2009   Influenza, High Dose Seasonal PF 03/29/2014, 04/02/2015, 03/31/2016, 04/08/2017, 04/16/2018   Influenza-Unspecified 04/16/2019, 03/29/2020   Moderna Covid-19 Fall Seasonal Vaccine 2yrs & older 05/07/2022   Moderna Covid-19 Vaccine Bivalent Booster 55yrs & up 04/28/2023   Moderna Sars-Covid-2 Vaccination 07/19/2019, 08/17/2019, 06/01/2020, 12/09/2021   Pneumococcal Conjugate-13 03/29/2014   Pneumococcal Polysaccharide-23 10/31/2003, 09/07/2006,  03/25/2013   Td (Adult) 10/30/1993   Tdap 07/07/2010, 03/19/2011, 04/16/2023   Zoster Recombinant(Shingrix) 10/24/2016, 12/24/2016   Zoster, Live 10/30/2005, 10/21/2016, 12/24/2016   Zoster, Unspecified 10/24/2016   Pertinent  Health Maintenance Due  Topic Date Due   OPHTHALMOLOGY EXAM  07/30/2023   FOOT EXAM  10/07/2023   HEMOGLOBIN A1C  01/06/2024   INFLUENZA VACCINE  Completed      03/31/2023    1:05 PM 04/09/2023    4:03 PM 04/20/2023    4:07 PM 05/22/2023   10:57 AM 07/20/2023    1:17 PM  Fall Risk  Falls in the past year? 1 1 0 0 0  Was there an injury with Fall? 1 1 0 0 0  Fall Risk Category Calculator 3 2 0 0 0  Patient at Risk for Falls Due to  History of fall(s) No Fall Risks No Fall Risks No Fall Risks  Fall risk Follow up Falls evaluation completed Falls evaluation completed Falls evaluation completed Falls evaluation completed Falls evaluation completed   Functional Status Survey:    Vitals:   09/03/23 2053  BP: 139/64  Pulse: 60  Resp: 16  Temp: (!) 97.3 F (36.3 C)  SpO2: 95%   There is no height or weight on file to calculate BMI. Physical Exam Vitals and nursing note reviewed.  Constitutional:      General: He is not in acute distress.    Appearance: He is not diaphoretic.  HENT:     Head: Normocephalic and atraumatic.     Mouth/Throat:     Mouth: Mucous membranes are moist.     Pharynx: Oropharynx is clear.  Neck:     Thyroid: No thyromegaly.     Vascular: No JVD.     Trachea: No tracheal deviation.  Cardiovascular:     Rate and Rhythm: Normal rate and regular rhythm.     Heart sounds: No murmur heard. Pulmonary:     Effort: Pulmonary effort is normal. No respiratory distress.     Breath sounds: Normal breath sounds. No wheezing.  Abdominal:     General: Bowel sounds are normal. There is no distension.     Palpations: Abdomen is soft.     Tenderness: There is no abdominal tenderness.  Lymphadenopathy:     Cervical: No cervical  adenopathy.  Skin:    General: Skin is warm and dry.  Neurological:     General: No focal deficit present.     Mental Status: He is alert. Mental status is at baseline.     Labs reviewed: Recent Labs    01/14/23 0410 02/24/23 0000 07/21/23 0000  NA 140 142 141  K 4.3  4.5 4.1  CL 107 106 106  CO2 23 24* 27*  GLUCOSE 93  --   --   BUN 30* 33* 36*  CREATININE 1.61* 1.4* 1.5*  CALCIUM 9.1 9.0 8.4*   Recent Labs    02/24/23 0000  AST 32  ALT 25  ALKPHOS 104  ALBUMIN 3.9   Recent Labs    01/14/23 0410 02/24/23 0000  WBC 8.5 5.7  NEUTROABS 6.7  --   HGB 12.1* 11.9*  HCT 35.9* 36*  MCV 94.7  --   PLT 217 294   Lab Results  Component Value Date   TSH 2.58 08/19/2022   Lab Results  Component Value Date   HGBA1C 8.3 07/09/2023   Lab Results  Component Value Date   CHOL 136 08/19/2022   HDL 37 08/19/2022   LDLCALC 80 08/19/2022   TRIG 96 08/19/2022   CHOLHDL 3.0 09/04/2007    Significant Diagnostic Results in last 30 days:  No results found.  Assessment/Plan  1. Essential hypertension (Primary)  - hydrALAZINE (APRESOLINE) 25 MG tablet; Take 1 tablet (25 mg total) by mouth 3 (three) times daily.  2. Vomiting, unspecified vomiting type, unspecified whether nausea present Improved with Reglan but did still occur.  Will meds the same Treat underlying constipation Consider s/e of new med vs continued gastroparesis  3. Alzheimer's dementia with behavioral disturbance (HCC) Progressive decline in cognition and physical function c/w the disease. Continue supportive care in the skilled environment. Asked the nurse to provide most form to his daughte.r   4. Slow transit constipation  - polyethylene glycol (MIRALAX) 17 g packet; Take 17 g by mouth daily.   Labs/tests ordered:  tegretol and BMP in March

## 2023-09-07 ENCOUNTER — Non-Acute Institutional Stay (SKILLED_NURSING_FACILITY): Payer: Self-pay | Admitting: Internal Medicine

## 2023-09-07 DIAGNOSIS — R1110 Vomiting, unspecified: Secondary | ICD-10-CM

## 2023-09-07 DIAGNOSIS — G309 Alzheimer's disease, unspecified: Secondary | ICD-10-CM

## 2023-09-07 DIAGNOSIS — Z794 Long term (current) use of insulin: Secondary | ICD-10-CM

## 2023-09-07 DIAGNOSIS — G4709 Other insomnia: Secondary | ICD-10-CM

## 2023-09-07 DIAGNOSIS — E1169 Type 2 diabetes mellitus with other specified complication: Secondary | ICD-10-CM | POA: Diagnosis not present

## 2023-09-07 DIAGNOSIS — I10 Essential (primary) hypertension: Secondary | ICD-10-CM | POA: Diagnosis not present

## 2023-09-07 DIAGNOSIS — I2510 Atherosclerotic heart disease of native coronary artery without angina pectoris: Secondary | ICD-10-CM

## 2023-09-07 DIAGNOSIS — F5101 Primary insomnia: Secondary | ICD-10-CM

## 2023-09-07 DIAGNOSIS — F02818 Dementia in other diseases classified elsewhere, unspecified severity, with other behavioral disturbance: Secondary | ICD-10-CM

## 2023-09-07 DIAGNOSIS — R251 Tremor, unspecified: Secondary | ICD-10-CM

## 2023-09-07 DIAGNOSIS — E785 Hyperlipidemia, unspecified: Secondary | ICD-10-CM

## 2023-09-07 NOTE — Progress Notes (Unsigned)
 Location:  Medical illustrator of Service:  SNF ([id number])  Provider:   Code Status: DNR Goals of Care:     09/08/2023   10:56 AM  Advanced Directives  Does Patient Have a Medical Advance Directive? Yes  Type of Estate agent of [place];Living will;Out of facility DNR (pink MOST or yellow form)  Does patient want to make changes to medical advance directive? No - Patient declined  Copy of Healthcare Power of Attorney in Chart? No - copy requested  Pre-existing out of facility DNR order (yellow form or pink MOST form) Yellow form placed in chart (order not valid for inpatient use)     Chief Complaint  Patient presents with   Care Management    HPI: Patient is a 88 y.o. male seen today for medical management of chronic diseases.   Lives in Memory Unit in [place] Wife there also in different rooms     Alzheimer Dementia MRI shows Advanced Atrophy Behaviors Followed by Dr Donell Beers Also has a history of diabetes and hypertension  Patient has been having off-and-on vomiting for the past few months. He was recently started on Tegretol by Dr. Jasmine Awe   He also is on Exelon p.o. It is hard to get much history from him.   He was also started on reglan 2.5 mg twice daily which did improve his vomiting initially.  But then yesterday he had few more episodes. This morning he did not eat. No Fever Bowels are moving Well No Diarrhea   Per nurses it usually happens after supper.  Does not look like patient has any abdominal pain.  Repeat KUB is pending.  In the past the KUB has been negative. He was sitting in his Wheelchair and did not look in any distress BP runs high  Past Medical History:  Diagnosis Date   CAD (coronary artery disease)    Bare meta stent RCA   Carotid stenosis    a. Carotid US 3/17: bilateral ICA 1-39% >> repeat 2 years   Coronary atherosclerosis of native coronary artery 08/24/2008   S/P PCI   Dementia 12/15/2021   concerns for  Alzheimer's disease   Essential hypertension, benign 08/24/2008   Fatigue 08/08/2010   History of nuclear stress test    Myoview 12/16: EF 66%, very mild inferior ischemia; Low Risk   Mixed hyperlipidemia 08/24/2008   Myocardial infarction 10/06/2007   Nonproliferative diabetic retinopathy 08/08/2011   Nuclear sclerosis of both eyes 12/01/2017   Peripheral neuropathy    Pulmonary nodule, right 09/13/2007   Right upper lobe         Sinoatrial node dysfunction 08/24/2008   Chronotropic incompetence   Snoring 09/27/2009   Type II diabetes mellitus 08/08/2011    Past Surgical History:  Procedure Laterality Date   LEFT HEART CATHETERIZATION WITH CORONARY ANGIOGRAM N/A 03/04/2012   Procedure: LEFT HEART CATHETERIZATION WITH CORONARY ANGIOGRAM;  Surgeon: Kathleene Hazel, MD;  Location: [facility] CATH LAB;  Service: Cardiovascular;  Laterality: N/A;   TONSILLECTOMY      Allergies  Allergen Reactions   Ace Inhibitors    Alpha Lipoic Acid [Lipoic Acid]     Upset Stomach   Invokana [Canagliflozin]    Heparin Hives   Isosorbide Mononitrate [Isosorbide Dinitrate Er] Hives   Penicillins Hives   Sulfonamide Derivatives Hives    Outpatient Encounter Medications as of 09/07/2023  Medication Sig   amLODipine (NORVASC) 10 MG tablet TAKE 1 TABLET ONCE DAILY.   aspirin  81 MG tablet Take 1 tablet (81 mg total) by mouth daily.   atorvastatin (LIPITOR) 10 MG tablet Take 10 mg by mouth daily.   citalopram (CELEXA) 20 MG tablet Take 20 mg by mouth daily.   dextromethorphan-guaiFENesin (ROBITUSSIN-DM) 10-100 MG/5ML liquid Take by mouth every 6 (six) hours as needed for cough.   famotidine (PEPCID) 20 MG tablet Take 20 mg by mouth 2 (two) times daily.   hydrALAZINE (APRESOLINE) 10 MG tablet Take 1 tablet (10 mg total) by mouth 3 (three) times daily as needed.   hydrALAZINE (APRESOLINE) 25 MG tablet Take 1 tablet (25 mg total) by mouth 3 (three) times daily.   hydrOXYzine (VISTARIL) 25 MG capsule Take  1 capsule (25 mg total) by mouth 2 (two) times daily as needed (increased agitation/ sundowning).   hydrOXYzine (VISTARIL) 25 MG capsule Take 25 mg by mouth in the morning and at bedtime. Give at 3 and 5pm   insulin aspart (NOVOLOG) 100 UNIT/ML injection Inject 7 Units into the skin once. With lunch and Dinner Only if CBGS more then 200   LANTUS SOLOSTAR 100 UNIT/ML injection Inject 32 Units into the skin daily.   latanoprost (XALATAN) 0.005 % ophthalmic solution Place 1 drop into both eyes at bedtime.   memantine (NAMENDA) 10 MG tablet TAKE ONE TABLET BY MOUTH EVERY MORNING and TAKE ONE TABLET BY MOUTH EVERYDAY AT BEDTIME   metoCLOPramide (REGLAN) 5 MG tablet Take 0.5 tablets (2.5 mg total) by mouth 3 (three) times daily before meals.   mirtazapine (REMERON SOL-TAB) 15 MG disintegrating tablet Take 1 tablet (15 mg total) by mouth at bedtime.   omeprazole (PRILOSEC OTC) 20 MG tablet Take 1 tablet (20 mg total) by mouth daily.   ondansetron (ZOFRAN) 4 MG tablet Take 4 mg by mouth every 6 (six) hours as needed for nausea or vomiting.   polyethylene glycol (MIRALAX) 17 g packet Take 17 g by mouth daily.   rivastigmine (EXELON) 1.5 MG capsule TAKE ONE CAPSULE BY MOUTH TWICE DAILY   Suvorexant (BELSOMRA) 10 MG TABS Take 1 tablet (10 mg total) by mouth at bedtime.   [DISCONTINUED] carBAMazepine (TEGRETOL) 100 MG/5ML suspension Take 5 mLs (100 mg total) by mouth 2 (two) times daily.   No facility-administered encounter medications on file as of 09/07/2023.    Review of Systems:  Review of Systems  Unable to perform ROS: Dementia    Health Maintenance  Topic Date Due   COVID-19 Vaccine (7 - 2024-25 season) 06/23/2023   OPHTHALMOLOGY EXAM  07/30/2023   FOOT EXAM  10/07/2023   HEMOGLOBIN A1C  01/06/2024   Medicare Annual Wellness (AWV)  04/08/2024   DTaP/Tdap/Td (4 - Td or Tdap) 04/15/2033   Pneumonia Vaccine 30+ Years old  Completed   INFLUENZA VACCINE  Completed   HPV VACCINES  Aged Out    Zoster Vaccines- Shingrix  Discontinued    Physical Exam: Vitals:   09/07/23 1207  BP: (!) 166/71  Pulse: 61  Resp: 20  Temp: (!) 97.5 F (36.4 C)  Weight: 176 lb (79.8 kg)   Body mass index is 22.6 kg/m. Physical Exam Vitals reviewed.  Constitutional:      Appearance: Normal appearance.  HENT:     Head: Normocephalic.     Nose: Nose normal.     Mouth/Throat:     Mouth: Mucous membranes are moist.     Pharynx: Oropharynx is clear.  Eyes:     Pupils: Pupils are equal, round, and reactive to light.  Cardiovascular:     Rate and Rhythm: Normal rate and regular rhythm.     Pulses: Normal pulses.     Heart sounds: No murmur heard. Pulmonary:     Effort: Pulmonary effort is normal. No respiratory distress.     Breath sounds: Normal breath sounds. No rales.  Abdominal:     General: Abdomen is flat. Bowel sounds are normal. There is no distension.     Palpations: Abdomen is soft.     Tenderness: There is no abdominal tenderness. There is no guarding.  Musculoskeletal:        General: No swelling.     Cervical back: Neck supple.  Skin:    General: Skin is warm.  Neurological:     General: No focal deficit present.     Mental Status: He is alert.     Comments: Patient has become more Aphasic and Less responsive  Psychiatric:        Mood and Affect: Mood normal.        Thought Content: Thought content normal.     Labs reviewed: Basic Metabolic Panel: Recent Labs    01/14/23 0410 02/24/23 0000 07/21/23 0000  NA 140 142 141  K 4.3 4.5 4.1  CL 107 106 106  CO2 23 24* 27*  GLUCOSE 93  --   --   BUN 30* 33* 36*  CREATININE 1.61* 1.4* 1.5*  CALCIUM 9.1 9.0 8.4*   Liver Function Tests: Recent Labs    02/24/23 0000  AST 32  ALT 25  ALKPHOS 104  ALBUMIN 3.9   No results for input(s): "LIPASE", "AMYLASE" in the last 8760 hours. No results for input(s): "AMMONIA" in the last 8760 hours. CBC: Recent Labs    01/14/23 0410 02/24/23 0000  WBC 8.5 5.7   NEUTROABS 6.7  --   HGB 12.1* 11.9*  HCT 35.9* 36*  MCV 94.7  --   PLT 217 294   Lipid Panel: No results for input(s): "CHOL", "HDL", "LDLCALC", "TRIG", "CHOLHDL", "LDLDIRECT" in the last 8760 hours. Lab Results  Component Value Date   HGBA1C 8.3 07/09/2023    Procedures since last visit: No results found.  Assessment/Plan 1. Vomiting, unspecified vomiting type, unspecified whether nausea present (Primary) Change Prilosec to 40 mg Change Reglan to 5 mg tid Discontinue Exelon Repeat KUB is pending Also Talk to Dr Donell Beers about stopping Tegretol D/W the Daughter in the room He is not candidate for Work up like CT Scan She agreed to take more Conservative Approach  2. Essential hypertension BP running high  Will change his Hydralazine to 37.5 mg TID   3. Alzheimer's dementia with behavioral disturbance (HCC) Worsening Physically and Cognitively D/w the daughter who agrees that we need to keep him more comfortable He is also on Number oif meds per Pscyh service for his behaviors Will Discontinue Exelon due to Nausea Vomiting Continue Namenda to help behaviors  4  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, with long-term current use of insulin, macular edema presence unspecified (HCC) Metformin stopped due to Renal Insufficiency and vomiting BS are covered with Insulin now A1C was 8.3 but taking conservative Approach CBGS mostly 100-200 5 Depression On Celexa and Remeron  Labs/tests ordered:  * No order type specified * Next appt:  Visit date not found

## 2023-09-08 ENCOUNTER — Non-Acute Institutional Stay (SKILLED_NURSING_FACILITY): Payer: Self-pay | Admitting: Orthopedic Surgery

## 2023-09-08 ENCOUNTER — Encounter: Payer: Self-pay | Admitting: Orthopedic Surgery

## 2023-09-08 DIAGNOSIS — R1110 Vomiting, unspecified: Secondary | ICD-10-CM

## 2023-09-08 DIAGNOSIS — F02818 Dementia in other diseases classified elsewhere, unspecified severity, with other behavioral disturbance: Secondary | ICD-10-CM

## 2023-09-08 DIAGNOSIS — G309 Alzheimer's disease, unspecified: Secondary | ICD-10-CM | POA: Diagnosis not present

## 2023-09-08 NOTE — Progress Notes (Signed)
 Location:  [facility] Room Number: 309A Place of Service:  SNF [phone]) Provider:  Priscella Mann, MD  Patient Care Team: Mahlon Gammon, MD as PCP - General (Internal Medicine) Kathleene Hazel, MD as PCP - Cardiology (Cardiology) Drema Dallas, DO as Consulting Physician (Neurology)  Extended Emergency Contact Information Primary Emergency Contact: [facility] Address: [address]          [place], [place] [address] [place] Home Phone: [phone] Mobile Phone: [phone] Relation: Daughter Secondary Emergency Contact: [facility] Phone: [phone] Relation: Spouse Preferred language: English Interpreter needed? No  Code Status:  FULL Goals of care: Advanced Directive information    09/08/2023   10:56 AM  Advanced Directives  Does Patient Have a Medical Advance Directive? Yes  Type of Estate agent of [place];Living will;Out of facility DNR (pink MOST or yellow form)  Does patient want to make changes to medical advance directive? No - Patient declined  Copy of Healthcare Power of Attorney in Chart? No - copy requested  Pre-existing out of facility DNR order (yellow form or pink MOST form) Yellow form placed in chart (order not valid for inpatient use)     Chief Complaint  Patient presents with   Acute Visit    Vomiting    HPI:  Pt is a 88 y.o. male seen today for an acute visit due to vomiting.   He had one episode of vomiting yesterday after eating dinner. Poor historian due to dementia. H/o intermittent vomiting and weight loss (13 lbs) within past 2 months. Depakote, diuretics and metformin were stopped. 08/2023 abdominal ultrasound noted questionable findings in common bile duct, no gallstones or sludge. No abnormalities to liver, right kidney or pancreas. Past alk phos, bilirubin, amylase and lipase normal. He was started on Reglan last month. Dr.  Chales Abrahams increased Reglan yesterday. Small BM 03/02 and 02/26 per chart review. He has been eating 75-100% of most meals within past week. Afebrile. Vitals stable.    Past Medical History:  Diagnosis Date   CAD (coronary artery disease)    Bare meta stent RCA   Carotid stenosis    a. Carotid US 3/17: bilateral ICA 1-39% >> repeat 2 years   Coronary atherosclerosis of native coronary artery 08/24/2008   S/P PCI   Dementia 12/15/2021   concerns for Alzheimer's disease   Essential hypertension, benign 08/24/2008   Fatigue 08/08/2010   History of nuclear stress test    Myoview 12/16: EF 66%, very mild inferior ischemia; Low Risk   Mixed hyperlipidemia 08/24/2008   Myocardial infarction 10/06/2007   Nonproliferative diabetic retinopathy 08/08/2011   Nuclear sclerosis of both eyes 12/01/2017   Peripheral neuropathy    Pulmonary nodule, right 09/13/2007   Right upper lobe         Sinoatrial node dysfunction 08/24/2008   Chronotropic incompetence   Snoring 09/27/2009   Type II diabetes mellitus 08/08/2011   Past Surgical History:  Procedure Laterality Date   LEFT HEART CATHETERIZATION WITH CORONARY ANGIOGRAM N/A 03/04/2012   Procedure: LEFT HEART CATHETERIZATION WITH CORONARY ANGIOGRAM;  Surgeon: Kathleene Hazel, MD;  Location: [facility] CATH LAB;  Service: Cardiovascular;  Laterality: N/A;   TONSILLECTOMY      Allergies  Allergen Reactions   Ace Inhibitors    Alpha Lipoic Acid [Lipoic Acid]     Upset Stomach   Invokana [Canagliflozin]    Heparin Hives   Isosorbide Mononitrate [Isosorbide Dinitrate Er] Hives  Penicillins Hives   Sulfonamide Derivatives Hives    Outpatient Encounter Medications as of 09/08/2023  Medication Sig   amLODipine (NORVASC) 10 MG tablet TAKE 1 TABLET ONCE DAILY.   aspirin 81 MG tablet Take 1 tablet (81 mg total) by mouth daily.   atorvastatin (LIPITOR) 10 MG tablet Take 10 mg by mouth daily.   carBAMazepine (TEGRETOL) 100 MG/5ML suspension Take 5 mLs  (100 mg total) by mouth 2 (two) times daily.   citalopram (CELEXA) 20 MG tablet Take 20 mg by mouth daily.   dextromethorphan-guaiFENesin (ROBITUSSIN-DM) 10-100 MG/5ML liquid Take by mouth every 6 (six) hours as needed for cough.   famotidine (PEPCID) 20 MG tablet Take 20 mg by mouth 2 (two) times daily.   hydrALAZINE (APRESOLINE) 10 MG tablet Take 1 tablet (10 mg total) by mouth 3 (three) times daily as needed.   hydrALAZINE (APRESOLINE) 25 MG tablet Take 1 tablet (25 mg total) by mouth 3 (three) times daily.   hydrOXYzine (VISTARIL) 25 MG capsule Take 1 capsule (25 mg total) by mouth 2 (two) times daily as needed (increased agitation/ sundowning).   hydrOXYzine (VISTARIL) 25 MG capsule Take 25 mg by mouth in the morning and at bedtime. Give at 3 and 5pm   insulin aspart (NOVOLOG) 100 UNIT/ML injection Inject 7 Units into the skin once. With lunch and Dinner Only if CBGS more then 200   LANTUS SOLOSTAR 100 UNIT/ML injection Inject 32 Units into the skin daily.   latanoprost (XALATAN) 0.005 % ophthalmic solution Place 1 drop into both eyes at bedtime.   memantine (NAMENDA) 10 MG tablet TAKE ONE TABLET BY MOUTH EVERY MORNING and TAKE ONE TABLET BY MOUTH EVERYDAY AT BEDTIME   metoCLOPramide (REGLAN) 5 MG tablet Take 0.5 tablets (2.5 mg total) by mouth 3 (three) times daily before meals.   mirtazapine (REMERON SOL-TAB) 15 MG disintegrating tablet Take 1 tablet (15 mg total) by mouth at bedtime.   omeprazole (PRILOSEC OTC) 20 MG tablet Take 1 tablet (20 mg total) by mouth daily.   ondansetron (ZOFRAN) 4 MG tablet Take 4 mg by mouth every 6 (six) hours as needed for nausea or vomiting.   polyethylene glycol (MIRALAX) 17 g packet Take 17 g by mouth daily.   rivastigmine (EXELON) 1.5 MG capsule TAKE ONE CAPSULE BY MOUTH TWICE DAILY   Suvorexant (BELSOMRA) 10 MG TABS Take 1 tablet (10 mg total) by mouth at bedtime.   No facility-administered encounter medications on file as of 09/08/2023.    Review of  Systems  Unable to perform ROS: Dementia    Immunization History  Administered Date(s) Administered   Fluad Quad(high Dose 65+) 04/08/2022, 04/28/2023   Influenza Split 05/01/2010, 03/11/2011, 04/06/2012, 04/21/2013, 05/03/2021   Influenza Whole 03/07/2009   Influenza, High Dose Seasonal PF 03/29/2014, 04/02/2015, 03/31/2016, 04/08/2017, 04/16/2018   Influenza-Unspecified 04/16/2019, 03/29/2020   Moderna Covid-19 Fall Seasonal Vaccine 53yrs & older 05/07/2022   Moderna Covid-19 Vaccine Bivalent Booster 41yrs & up 04/28/2023   Moderna Sars-Covid-2 Vaccination 07/19/2019, 08/17/2019, 06/01/2020, 12/09/2021   Pneumococcal Conjugate-13 03/29/2014   Pneumococcal Polysaccharide-23 10/31/2003, 09/07/2006, 03/25/2013   Td (Adult) 10/30/1993   Tdap 07/07/2010, 03/19/2011, 04/16/2023   Zoster Recombinant(Shingrix) 10/24/2016, 12/24/2016   Zoster, Live 10/30/2005, 10/21/2016, 12/24/2016   Zoster, Unspecified 10/24/2016   Pertinent  Health Maintenance Due  Topic Date Due   OPHTHALMOLOGY EXAM  07/30/2023   FOOT EXAM  10/07/2023   HEMOGLOBIN A1C  01/06/2024   INFLUENZA VACCINE  Completed      03/31/2023  1:05 PM 04/09/2023    4:03 PM 04/20/2023    4:07 PM 05/22/2023   10:57 AM 07/20/2023    1:17 PM  Fall Risk  Falls in the past year? 1 1 0 0 0  Was there an injury with Fall? 1 1 0 0 0  Fall Risk Category Calculator 3 2 0 0 0  Patient at Risk for Falls Due to  History of fall(s) No Fall Risks No Fall Risks No Fall Risks  Fall risk Follow up Falls evaluation completed Falls evaluation completed Falls evaluation completed Falls evaluation completed Falls evaluation completed   Functional Status Survey:    Vitals:   09/08/23 1050 09/08/23 1056  BP: (!) 172/95 (!) 163/85  Pulse: 79   Resp: 18   Temp: (!) 97.2 F (36.2 C)   SpO2: 96%   Weight: 176 lb 9.6 oz (80.1 kg)   Height: 6\' 2"  (1.88 m)    Body mass index is 22.67 kg/m. Physical Exam Vitals reviewed.  Constitutional:       General: He is not in acute distress. HENT:     Head: Normocephalic.  Eyes:     General:        Right eye: No discharge.        Left eye: No discharge.  Cardiovascular:     Rate and Rhythm: Normal rate and regular rhythm.     Pulses: Normal pulses.     Heart sounds: Normal heart sounds.  Pulmonary:     Effort: Pulmonary effort is normal.     Breath sounds: Normal breath sounds.  Abdominal:     General: Bowel sounds are normal. There is no distension.     Palpations: Abdomen is soft.     Tenderness: There is no abdominal tenderness. There is no guarding.  Musculoskeletal:     Cervical back: Neck supple.     Right lower leg: No edema.     Left lower leg: No edema.  Skin:    General: Skin is warm.     Capillary Refill: Capillary refill takes less than 2 seconds.  Neurological:     General: No focal deficit present.     Mental Status: He is alert. Mental status is at baseline.     Motor: Weakness present.     Gait: Gait abnormal.  Psychiatric:        Mood and Affect: Mood normal.     Labs reviewed: Recent Labs    01/14/23 0410 02/24/23 0000 07/21/23 0000  NA 140 142 141  K 4.3 4.5 4.1  CL 107 106 106  CO2 23 24* 27*  GLUCOSE 93  --   --   BUN 30* 33* 36*  CREATININE 1.61* 1.4* 1.5*  CALCIUM 9.1 9.0 8.4*   Recent Labs    02/24/23 0000  AST 32  ALT 25  ALKPHOS 104  ALBUMIN 3.9   Recent Labs    01/14/23 0410 02/24/23 0000  WBC 8.5 5.7  NEUTROABS 6.7  --   HGB 12.1* 11.9*  HCT 35.9* 36*  MCV 94.7  --   PLT 217 294   Lab Results  Component Value Date   TSH 2.58 08/19/2022   Lab Results  Component Value Date   HGBA1C 8.3 07/09/2023   Lab Results  Component Value Date   CHOL 136 08/19/2022   HDL 37 08/19/2022   LDLCALC 80 08/19/2022   TRIG 96 08/19/2022   CHOLHDL 3.0 09/04/2007    Significant Diagnostic Results in last  30 days:  No results found.  Assessment/Plan 1. Vomiting, unspecified vomiting type, unspecified whether nausea present  (Primary) - ongoing x 2 months, 13 lbs weight loss - diuretics, metformin, Depakote stopped  - past labs unremarkable including amylase, lipase - abdominal ultrasound noted questionable area to common bile duct> no gallstones or sludge - exam unremarkable today - LBM 03/02 & 02/26> small - KUB to r/o obstruction - Reglan increased by Dr. Chales Abrahams 03/03 - stat cbc/diff, cmp - discontinue tegretol> contact Dr. Donell Beers with update> will consider trileptal  2. Alzheimer's dementia with behavioral disturbance (HCC) - resides in memory care - followed by Dr. Donell Beers - dependent with ADLs - see above - cont Namenda and Exelon    Family/ staff Communication: plan discussed with patient and nursing   Labs/tests ordered:  stat KUB, cbc/diff, cmp

## 2023-09-10 ENCOUNTER — Encounter: Payer: Self-pay | Admitting: Internal Medicine

## 2023-09-21 ENCOUNTER — Ambulatory Visit: Payer: PPO | Admitting: Physician Assistant

## 2023-09-21 LAB — BASIC METABOLIC PANEL WITH GFR
BUN: 19 (ref 4–21)
CO2: 23 — AB (ref 13–22)
Chloride: 106 (ref 99–108)
Creatinine: 1.3 (ref 0.6–1.3)
Glucose: 8
Potassium: 4.4 meq/L (ref 3.5–5.1)
Sodium: 143 (ref 137–147)

## 2023-09-21 LAB — CBC AND DIFFERENTIAL
HCT: 36 — AB (ref 41–53)
Hemoglobin: 12.1 — AB (ref 13.5–17.5)
Platelets: 252 10*3/uL (ref 150–400)
WBC: 5.2

## 2023-09-21 LAB — HEPATIC FUNCTION PANEL
ALT: 29 U/L (ref 10–40)
AST: 32 (ref 14–40)
Alkaline Phosphatase: 125 (ref 25–125)
Bilirubin, Total: 0.6

## 2023-09-21 LAB — COMPREHENSIVE METABOLIC PANEL WITH GFR
Albumin: 3.7 (ref 3.5–5.0)
Calcium: 8.9 (ref 8.7–10.7)
Globulin: 2.5
eGFR: 52

## 2023-09-21 LAB — CBC: RBC: 3.86 — AB (ref 3.87–5.11)

## 2023-09-29 ENCOUNTER — Ambulatory Visit: Payer: PPO | Admitting: Physician Assistant

## 2023-10-12 ENCOUNTER — Non-Acute Institutional Stay (SKILLED_NURSING_FACILITY): Payer: Self-pay | Admitting: Adult Health

## 2023-10-12 ENCOUNTER — Encounter: Payer: Self-pay | Admitting: Adult Health

## 2023-10-12 DIAGNOSIS — E113293 Type 2 diabetes mellitus with mild nonproliferative diabetic retinopathy without macular edema, bilateral: Secondary | ICD-10-CM | POA: Diagnosis not present

## 2023-10-12 DIAGNOSIS — Z66 Do not resuscitate: Secondary | ICD-10-CM

## 2023-10-12 DIAGNOSIS — I10 Essential (primary) hypertension: Secondary | ICD-10-CM

## 2023-10-12 DIAGNOSIS — Z794 Long term (current) use of insulin: Secondary | ICD-10-CM

## 2023-10-12 DIAGNOSIS — N1831 Chronic kidney disease, stage 3a: Secondary | ICD-10-CM

## 2023-10-12 DIAGNOSIS — R1111 Vomiting without nausea: Secondary | ICD-10-CM | POA: Diagnosis not present

## 2023-10-12 MED ORDER — ONDANSETRON HCL 4 MG PO TABS: 4.0000 mg | ORAL_TABLET | Freq: Three times a day (TID) | ORAL | Status: DC

## 2023-10-12 NOTE — Progress Notes (Signed)
 Location:  [facility] Room Number: 309 A Place of Service:  SNF [phone]) Provider:  Fletcher Anon, NP   Patient Care Team: Mahlon Gammon, MD as PCP - General (Internal Medicine) Kathleene Hazel, MD as PCP - Cardiology (Cardiology) Drema Dallas, DO as Consulting Physician (Neurology)  Extended Emergency Contact Information Primary Emergency Contact: [facility] Address: [address]          [place], [place] [address] [place] Home Phone: [phone] Mobile Phone: [phone] Relation: Daughter Secondary Emergency Contact: [facility] Phone: [phone] Relation: Spouse Preferred language: English Interpreter needed? No  Code Status:  DNR Goals of care: Advanced Directive information    10/12/2023    9:38 AM  Advanced Directives  Does Patient Have a Medical Advance Directive? Yes  Type of Advance Directive Living will;Out of facility DNR (pink MOST or yellow form)  Does patient want to make changes to medical advance directive? No - Patient declined  Pre-existing out of facility DNR order (yellow form or pink MOST form) Yellow form placed in chart (order not valid for inpatient use)     Chief Complaint  Patient presents with   Diabetes    HPI:  Pt is a 88 y.o. male seen today for an acute visit for f/u on diabetes  Lantus was on hold one day ago due to CBG 70-80s with decreased intake due to gastroenteritis going around the memory care unit. Also he has some chronic vomiting issues and was placed on reglan, dose increase to 5 mg on 3/3 due to gastroparesis. KUB neg on 09/10/23 Since 4/4 he has had some intermittent diarrhea and vomiting. Prior to this he was improving. CDiff and stool pathogen negative. Norovirus PCR pending. He is somewhat improving. Did take in jello and fluids this am and kept it down CBG 209 this am BP running in the 160-180 range systolic A1C 8.10 Jul 2023 on Lantus and meal  coverage. Off metformin due to vomiting.  Off depakote due to gi concerns    Past Medical History:  Diagnosis Date   CAD (coronary artery disease)    Bare meta stent RCA   Carotid stenosis    a. Carotid US 3/17: bilateral ICA 1-39% >> repeat 2 years   Coronary atherosclerosis of native coronary artery 08/24/2008   S/P PCI   Dementia 12/15/2021   concerns for Alzheimer's disease   Essential hypertension, benign 08/24/2008   Fatigue 08/08/2010   History of nuclear stress test    Myoview 12/16: EF 66%, very mild inferior ischemia; Low Risk   Mixed hyperlipidemia 08/24/2008   Myocardial infarction 10/06/2007   Nonproliferative diabetic retinopathy 08/08/2011   Nuclear sclerosis of both eyes 12/01/2017   Peripheral neuropathy    Pulmonary nodule, right 09/13/2007   Right upper lobe         Sinoatrial node dysfunction 08/24/2008   Chronotropic incompetence   Snoring 09/27/2009   Type II diabetes mellitus 08/08/2011   Past Surgical History:  Procedure Laterality Date   LEFT HEART CATHETERIZATION WITH CORONARY ANGIOGRAM N/A 03/04/2012   Procedure: LEFT HEART CATHETERIZATION WITH CORONARY ANGIOGRAM;  Surgeon: Kathleene Hazel, MD;  Location: [facility] CATH LAB;  Service: Cardiovascular;  Laterality: N/A;   TONSILLECTOMY      Allergies  Allergen Reactions   Ace Inhibitors    Alpha Lipoic Acid [Lipoic Acid]     Upset Stomach   Invokana [Canagliflozin]    Heparin Hives   Isosorbide Mononitrate [Isosorbide Dinitrate Er]  Hives   Penicillins Hives   Sulfonamide Derivatives Hives    Outpatient Encounter Medications as of 10/12/2023  Medication Sig   amLODipine (NORVASC) 10 MG tablet TAKE 1 TABLET ONCE DAILY.   aspirin 81 MG tablet Take 1 tablet (81 mg total) by mouth daily.   atorvastatin (LIPITOR) 10 MG tablet Take 10 mg by mouth daily.   citalopram (CELEXA) 20 MG tablet Take 20 mg by mouth daily.   dextromethorphan-guaiFENesin (ROBITUSSIN-DM) 10-100 MG/5ML liquid Take by mouth  every 6 (six) hours as needed for cough.   famotidine (PEPCID) 20 MG tablet Take 20 mg by mouth 2 (two) times daily.   feeding supplement, GLUCERNA SHAKE, (GLUCERNA SHAKE) LIQD Take 237 mLs by mouth at bedtime.   hydrALAZINE (APRESOLINE) 10 MG tablet Take 1 tablet (10 mg total) by mouth 3 (three) times daily as needed.   hydrALAZINE (APRESOLINE) 25 MG tablet Take 1 tablet (25 mg total) by mouth 3 (three) times daily.   hydrALAZINE (APRESOLINE) 25 MG tablet Take 37.5 mg by mouth 3 (three) times daily. 8 am, 1 pm, and 6 pm   hydrOXYzine (VISTARIL) 25 MG capsule Take 1 capsule (25 mg total) by mouth 2 (two) times daily as needed (increased agitation/ sundowning).   hydrOXYzine (VISTARIL) 25 MG capsule Take 25 mg by mouth in the morning and at bedtime. Give at 3 and 5pm   insulin aspart (NOVOLOG) 100 UNIT/ML injection Inject 7 Units into the skin once. With lunch and Dinner Only if CBGS more then 200   LANTUS SOLOSTAR 100 UNIT/ML injection Inject 32 Units into the skin daily.   latanoprost (XALATAN) 0.005 % ophthalmic solution Place 1 drop into both eyes at bedtime.   memantine (NAMENDA) 10 MG tablet TAKE ONE TABLET BY MOUTH EVERY MORNING and TAKE ONE TABLET BY MOUTH EVERYDAY AT BEDTIME   metoCLOPramide (REGLAN) 5 MG tablet Take 5 mg by mouth 3 (three) times daily before meals. 7:30 am, 11 am, and 5 pm   mirtazapine (REMERON SOL-TAB) 15 MG disintegrating tablet Take 1 tablet (15 mg total) by mouth at bedtime.   omeprazole (PRILOSEC) 20 MG capsule Take 40 mg by mouth daily.   ondansetron (ZOFRAN) 4 MG tablet Take 4 mg by mouth every 6 (six) hours as needed for nausea or vomiting.   ondansetron (ZOFRAN) 4 MG/5ML solution every 8 (eight) hours as needed for nausea or vomiting.   polyethylene glycol (MIRALAX) 17 g packet Take 17 g by mouth daily.   Suvorexant (BELSOMRA) 10 MG TABS Take 1 tablet (10 mg total) by mouth at bedtime.   metoCLOPramide (REGLAN) 5 MG tablet Take 0.5 tablets (2.5 mg total) by  mouth 3 (three) times daily before meals. (Patient not taking: Reported on 10/12/2023)   omeprazole (PRILOSEC OTC) 20 MG tablet Take 1 tablet (20 mg total) by mouth daily. (Patient not taking: Reported on 10/12/2023)   No facility-administered encounter medications on file as of 10/12/2023.    Review of Systems  Unable to perform ROS: Dementia    Immunization History  Administered Date(s) Administered   Fluad Quad(high Dose 65+) 04/08/2022, 04/28/2023   Influenza Split 05/01/2010, 03/11/2011, 04/06/2012, 04/21/2013, 05/03/2021   Influenza Whole 03/07/2009   Influenza, High Dose Seasonal PF 03/29/2014, 04/02/2015, 03/31/2016, 04/08/2017, 04/16/2018   Influenza-Unspecified 04/16/2019, 03/29/2020   Moderna Covid-19 Fall Seasonal Vaccine 3yrs & older 05/07/2022   Moderna Covid-19 Vaccine Bivalent Booster 79yrs & up 04/28/2023   Moderna Sars-Covid-2 Vaccination 07/19/2019, 08/17/2019, 06/01/2020, 12/09/2021   Pneumococcal Conjugate-13 03/29/2014  Pneumococcal Polysaccharide-23 10/31/2003, 09/07/2006, 03/25/2013   Td (Adult) 10/30/1993   Tdap 07/07/2010, 03/19/2011, 04/16/2023   Zoster Recombinant(Shingrix) 10/24/2016, 12/24/2016   Zoster, Live 10/30/2005, 10/21/2016, 12/24/2016   Zoster, Unspecified 10/24/2016   Pertinent  Health Maintenance Due  Topic Date Due   OPHTHALMOLOGY EXAM  07/30/2023   HEMOGLOBIN A1C  01/06/2024   INFLUENZA VACCINE  02/05/2024   FOOT EXAM  07/08/2024      03/31/2023    1:05 PM 04/09/2023    4:03 PM 04/20/2023    4:07 PM 05/22/2023   10:57 AM 07/20/2023    1:17 PM  Fall Risk  Falls in the past year? 1 1 0 0 0  Was there an injury with Fall? 1 1 0 0 0  Fall Risk Category Calculator 3 2 0 0 0  Patient at Risk for Falls Due to  History of fall(s) No Fall Risks No Fall Risks No Fall Risks  Fall risk Follow up Falls evaluation completed Falls evaluation completed Falls evaluation completed Falls evaluation completed Falls evaluation completed   Functional  Status Survey:    Vitals:   10/12/23 0906 10/12/23 0909  BP: (!) 166/69 (!) 188/80  Pulse: 72   Temp: (!) 97.3 F (36.3 C)   SpO2: 93%   Weight: 184 lb 8 oz (83.7 kg)   Height: 6' (1.829 m)    Body mass index is 25.02 kg/m. Physical Exam Vitals and nursing note reviewed.  Constitutional:      Appearance: Normal appearance.  HENT:     Head: Normocephalic and atraumatic.     Nose: Nose normal.     Mouth/Throat:     Mouth: Mucous membranes are moist.     Pharynx: Oropharynx is clear.  Cardiovascular:     Rate and Rhythm: Normal rate and regular rhythm.     Heart sounds: No murmur heard. Pulmonary:     Effort: Pulmonary effort is normal. No respiratory distress.     Breath sounds: Normal breath sounds. No wheezing.  Abdominal:     General: Bowel sounds are normal. There is no distension.     Palpations: Abdomen is soft.     Tenderness: There is no abdominal tenderness.  Musculoskeletal:     Cervical back: No rigidity.     Right lower leg: No edema.     Left lower leg: No edema.  Lymphadenopathy:     Cervical: No cervical adenopathy.  Skin:    General: Skin is warm and dry.  Neurological:     General: No focal deficit present.     Mental Status: He is alert. Mental status is at baseline.  Psychiatric:        Mood and Affect: Mood normal.     Labs reviewed: Recent Labs    01/14/23 0410 02/24/23 0000 07/21/23 0000 09/21/23 0000  NA 140 142 141 143  K 4.3 4.5 4.1 4.4  CL 107 106 106 106  CO2 23 24* 27* 23*  GLUCOSE 93  --   --   --   BUN 30* 33* 36* 19  CREATININE 1.61* 1.4* 1.5* 1.3  CALCIUM 9.1 9.0 8.4* 8.9   Recent Labs    02/24/23 0000 09/21/23 0000  AST 32 32  ALT 25 29  ALKPHOS 104 125  ALBUMIN 3.9 3.7   Recent Labs    01/14/23 0410 02/24/23 0000 09/21/23 0000  WBC 8.5 5.7 5.2  NEUTROABS 6.7  --   --   HGB 12.1* 11.9* 12.1*  HCT 35.9*  36* 36*  MCV 94.7  --   --   PLT 217 294 252   Lab Results  Component Value Date   TSH 2.58  08/19/2022   Lab Results  Component Value Date   HGBA1C 8.3 07/09/2023   Lab Results  Component Value Date   CHOL 136 08/19/2022   HDL 37 08/19/2022   LDLCALC 80 08/19/2022   TRIG 96 08/19/2022   CHOLHDL 3.0 09/04/2007    Significant Diagnostic Results in last 30 days:  No results found.  Assessment/Plan  1. DNR (do not resuscitate) (Primary) updated - Do not attempt resuscitation (DNR)  2. Vomiting without nausea, unspecified vomiting type Likely has chronic gastroparesis but now with gastroenteritis which is overall improving.  Normal LFTs Will add zofran with each meal No aggressive work up per goals of care.  Monitor weight, vitals, labs Recommend Speech eval  Continue PPI  3. 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, with long-term current use of insulin, macular edema presence unspecified (HCC) Restart Lantus at 20 units daily due to decreased intake, may increase over time if needed Eye exam if able   4. Stage 3a chronic kidney disease [facility]) Lab Results  Component Value Date   BUN 19 09/21/2023   Lab Results  Component Value Date   CREATININE 1.3 09/21/2023   Continue to periodically monitor BMP and avoid nephrotoxic agents   5. Essential hypertension Increase hydralazine 50 mg TID  Labs/tests ordered:  BMP A1C 1 week

## 2023-10-20 LAB — COMPREHENSIVE METABOLIC PANEL WITH GFR
Calcium: 8.6 — AB (ref 8.7–10.7)
eGFR: 45

## 2023-10-20 LAB — HEMOGLOBIN A1C: Hemoglobin A1C: 7.5

## 2023-10-20 LAB — BASIC METABOLIC PANEL WITH GFR
BUN: 18 (ref 4–21)
CO2: 22 (ref 13–22)
Chloride: 104 (ref 99–108)
Creatinine: 1.5 — AB (ref 0.6–1.3)
Glucose: 159
Potassium: 4.8 meq/L (ref 3.5–5.1)
Sodium: 139 (ref 137–147)

## 2023-10-22 ENCOUNTER — Encounter: Payer: Self-pay | Admitting: Internal Medicine

## 2023-11-13 ENCOUNTER — Encounter: Payer: Self-pay | Admitting: Adult Health

## 2023-11-13 ENCOUNTER — Non-Acute Institutional Stay: Payer: Self-pay | Admitting: Adult Health

## 2023-11-13 DIAGNOSIS — R1110 Vomiting, unspecified: Secondary | ICD-10-CM

## 2023-11-13 DIAGNOSIS — E113293 Type 2 diabetes mellitus with mild nonproliferative diabetic retinopathy without macular edema, bilateral: Secondary | ICD-10-CM | POA: Diagnosis not present

## 2023-11-13 DIAGNOSIS — Z794 Long term (current) use of insulin: Secondary | ICD-10-CM

## 2023-11-13 DIAGNOSIS — E782 Mixed hyperlipidemia: Secondary | ICD-10-CM

## 2023-11-13 DIAGNOSIS — I2510 Atherosclerotic heart disease of native coronary artery without angina pectoris: Secondary | ICD-10-CM

## 2023-11-13 DIAGNOSIS — F02818 Dementia in other diseases classified elsewhere, unspecified severity, with other behavioral disturbance: Secondary | ICD-10-CM

## 2023-11-13 DIAGNOSIS — I10 Essential (primary) hypertension: Secondary | ICD-10-CM

## 2023-11-13 DIAGNOSIS — G309 Alzheimer's disease, unspecified: Secondary | ICD-10-CM

## 2023-11-13 DIAGNOSIS — N1831 Chronic kidney disease, stage 3a: Secondary | ICD-10-CM

## 2023-11-13 NOTE — Progress Notes (Unsigned)
 Location:  [facility] Room Number: 309 A Place of Service:  SNF [phone]) Provider:  Wert,Christina,NP  Marguerite Shiley, MD  Patient Care Team: Marguerite Shiley, MD as PCP - General (Internal Medicine) Odie Benne, MD as PCP - Cardiology (Cardiology) Merriam Abbey, DO as Consulting Physician (Neurology)  Extended Emergency Contact Information Primary Emergency Contact: [facility] Address: [address]          [place], [place] [address] [place] Home Phone: [phone] Mobile Phone: [phone] Relation: Daughter Secondary Emergency Contact: [facility] Phone: [phone] Relation: Spouse Preferred language: English Interpreter needed? No  Code Status:  DNR Goals of care: Advanced Directive information    11/13/2023   11:46 AM  Advanced Directives  Does Patient Have a Medical Advance Directive? Yes  Type of Advance Directive Living will;Out of facility DNR (pink MOST or yellow form)  Does patient want to make changes to medical advance directive? No - Patient declined  Pre-existing out of facility DNR order (yellow form or pink MOST form) Yellow form placed in chart (order not valid for inpatient use)     Chief Complaint  Patient presents with   Medical Management of Chronic Issues    Routine Visit.    HPI:  Pt is a 88 y.o. male seen today for medical management of chronic diseases.     Past Medical History:  Diagnosis Date   CAD (coronary artery disease)    Bare meta stent RCA   Carotid stenosis    a. Carotid US  3/17: bilateral ICA 1-39% >> repeat 2 years   Coronary atherosclerosis of native coronary artery 08/24/2008   S/P PCI   Dementia 12/15/2021   concerns for Alzheimer's disease   Essential hypertension, benign 08/24/2008   Fatigue 08/08/2010   History of nuclear stress test    Myoview  12/16: EF 66%, very mild inferior ischemia; Low Risk   Mixed hyperlipidemia 08/24/2008    Myocardial infarction 10/06/2007   Nonproliferative diabetic retinopathy 08/08/2011   Nuclear sclerosis of both eyes 12/01/2017   Peripheral neuropathy    Pulmonary nodule, right 09/13/2007   Right upper lobe         Sinoatrial node dysfunction 08/24/2008   Chronotropic incompetence   Snoring 09/27/2009   Type II diabetes mellitus 08/08/2011   Past Surgical History:  Procedure Laterality Date   LEFT HEART CATHETERIZATION WITH CORONARY ANGIOGRAM N/A 03/04/2012   Procedure: LEFT HEART CATHETERIZATION WITH CORONARY ANGIOGRAM;  Surgeon: Odie Benne, MD;  Location: [facility] CATH LAB;  Service: Cardiovascular;  Laterality: N/A;   TONSILLECTOMY      Allergies  Allergen Reactions   Ace Inhibitors    Alpha Lipoic Acid [Lipoic Acid]     Upset Stomach   Invokana [Canagliflozin]    Heparin  Hives   Isosorbide  Mononitrate [Isosorbide  Dinitrate Er] Hives   Penicillins Hives   Sulfonamide Derivatives Hives    Outpatient Encounter Medications as of 11/13/2023  Medication Sig   amLODipine  (NORVASC ) 10 MG tablet TAKE 1 TABLET ONCE DAILY.   aspirin  81 MG tablet Take 1 tablet (81 mg total) by mouth daily.   atorvastatin (LIPITOR) 10 MG tablet Take 10 mg by mouth daily.   citalopram (CELEXA) 20 MG tablet Take 20 mg by mouth daily.   dextromethorphan-guaiFENesin (ROBITUSSIN-DM) 10-100 MG/5ML liquid Take 10 mLs by mouth every 6 (six) hours as needed for cough.   famotidine (PEPCID) 20 MG tablet Take 20 mg by mouth 2 (two) times daily.  feeding supplement, GLUCERNA SHAKE, (GLUCERNA SHAKE) LIQD Take 237 mLs by mouth at bedtime.   hydrALAZINE  (APRESOLINE ) 10 MG tablet Take 10 mg by mouth 3 (three) times daily as needed (for SBP >175).   hydrALAZINE  (APRESOLINE ) 25 MG tablet Take 50 mg by mouth 3 (three) times daily. 8 am, 1 pm, and 6 pm   hydrOXYzine  (VISTARIL ) 25 MG capsule Take 1 capsule (25 mg total) by mouth 2 (two) times daily as needed (increased agitation/ sundowning).   hydrOXYzine   (VISTARIL ) 25 MG capsule Take 25 mg by mouth in the morning and at bedtime. Give at 3 and 5pm   insulin aspart (NOVOLOG) 100 UNIT/ML injection Inject 7 Units into the skin 2 (two) times daily. With lunch and Dinner Only if CBGS more then 200   LANTUS SOLOSTAR 100 UNIT/ML injection Inject 20 Units into the skin daily.   latanoprost (XALATAN) 0.005 % ophthalmic solution Place 1 drop into both eyes at bedtime.   memantine  (NAMENDA ) 10 MG tablet TAKE ONE TABLET BY MOUTH EVERY MORNING and TAKE ONE TABLET BY MOUTH EVERYDAY AT BEDTIME   metoCLOPramide  (REGLAN ) 5 MG tablet Take 5 mg by mouth 3 (three) times daily before meals. 7:30 am, 11 am, and 5 pm   mirtazapine  (REMERON  SOL-TAB) 15 MG disintegrating tablet Take 1 tablet (15 mg total) by mouth at bedtime.   omeprazole  (PRILOSEC ) 20 MG capsule Take 40 mg by mouth daily.   ondansetron  (ZOFRAN ) 4 MG tablet Take 4 mg by mouth every 6 (six) hours as needed for nausea or vomiting.   ondansetron  (ZOFRAN ) 4 MG/5ML solution every 8 (eight) hours as needed for nausea or vomiting.   ondansetron  (ZOFRAN -ODT) 4 MG disintegrating tablet Take 4 mg by mouth as directed. Give 30 minutes prior to meals before Meals; 07:30 AM, 11:00 AM, 05:00 PM   polyethylene glycol (MIRALAX ) 17 g packet Take 17 g by mouth daily.   Suvorexant  (BELSOMRA ) 10 MG TABS Take 1 tablet (10 mg total) by mouth at bedtime.   hydrALAZINE  (APRESOLINE ) 10 MG tablet Take 1 tablet (10 mg total) by mouth 3 (three) times daily as needed. (Patient not taking: Reported on 11/13/2023)   ondansetron  (ZOFRAN ) 4 MG tablet Take 1 tablet (4 mg total) by mouth in the morning, at noon, and at bedtime. (Patient not taking: Reported on 11/13/2023)   No facility-administered encounter medications on file as of 11/13/2023.    Review of Systems  Immunization History  Administered Date(s) Administered   Fluad Quad(high Dose 65+) 04/08/2022, 04/28/2023   Influenza Split 05/01/2010, 03/11/2011, 04/06/2012, 04/21/2013,  05/03/2021   Influenza Whole 03/07/2009   Influenza, High Dose Seasonal PF 03/29/2014, 04/02/2015, 03/31/2016, 04/08/2017, 04/16/2018   Influenza-Unspecified 04/16/2019, 03/29/2020   Moderna Covid-19 Fall Seasonal Vaccine 34yrs & older 05/07/2022   Moderna Covid-19 Vaccine Bivalent Booster 69yrs & up 04/28/2023   Moderna Sars-Covid-2 Vaccination 07/19/2019, 08/17/2019, 06/01/2020, 12/09/2021   Pneumococcal Conjugate-13 03/29/2014   Pneumococcal Polysaccharide-23 10/31/2003, 09/07/2006, 03/25/2013   Td (Adult) 10/30/1993   Tdap 07/07/2010, 03/19/2011, 04/16/2023   Zoster Recombinant(Shingrix) 10/24/2016, 12/24/2016   Zoster, Live 10/30/2005, 10/21/2016, 12/24/2016   Zoster, Unspecified 10/24/2016   Pertinent  Health Maintenance Due  Topic Date Due   OPHTHALMOLOGY EXAM  07/30/2023   INFLUENZA VACCINE  02/05/2024   HEMOGLOBIN A1C  04/20/2024   FOOT EXAM  07/08/2024      03/31/2023    1:05 PM 04/09/2023    4:03 PM 04/20/2023    4:07 PM 05/22/2023   10:57 AM 07/20/2023  1:17 PM  Fall Risk  Falls in the past year? 1 1 0 0 0  Was there an injury with Fall? 1 1 0 0 0  Fall Risk Category Calculator 3 2 0 0 0  Patient at Risk for Falls Due to  History of fall(s) No Fall Risks No Fall Risks No Fall Risks  Fall risk Follow up Falls evaluation completed Falls evaluation completed Falls evaluation completed Falls evaluation completed Falls evaluation completed   Functional Status Survey:    Vitals:   11/13/23 1144 11/13/23 1145  BP: (!) 170/71 (!) 172/76  Pulse: 60   Resp: 18   Temp: (!) 97.3 F (36.3 C)   SpO2: 96%   Weight: 182 lb 9.6 oz (82.8 kg)   Height: 6' (1.829 m)    Body mass index is 24.77 kg/m. Physical Exam  Labs reviewed: Recent Labs    01/14/23 0410 02/24/23 0000 07/21/23 0000 09/21/23 0000 10/20/23 0000  NA 140   < > 141 143 139  K 4.3   < > 4.1 4.4 4.8  CL 107   < > 106 106 104  CO2 23   < > 27* 23* 22  GLUCOSE 93  --   --   --   --   BUN 30*   < >  36* 19 18  CREATININE 1.61*   < > 1.5* 1.3 1.5*  CALCIUM 9.1   < > 8.4* 8.9 8.6*   < > = values in this interval not displayed.   Recent Labs    02/24/23 0000 09/21/23 0000  AST 32 32  ALT 25 29  ALKPHOS 104 125  ALBUMIN 3.9 3.7   Recent Labs    01/14/23 0410 02/24/23 0000 09/21/23 0000  WBC 8.5 5.7 5.2  NEUTROABS 6.7  --   --   HGB 12.1* 11.9* 12.1*  HCT 35.9* 36* 36*  MCV 94.7  --   --   PLT 217 294 252   Lab Results  Component Value Date   TSH 2.58 08/19/2022   Lab Results  Component Value Date   HGBA1C 7.5 10/20/2023   Lab Results  Component Value Date   CHOL 136 08/19/2022   HDL 37 08/19/2022   LDLCALC 80 08/19/2022   TRIG 96 08/19/2022   CHOLHDL 3.0 09/04/2007    Significant Diagnostic Results in last 30 days:  No results found.  Assessment/Plan There are no diagnoses linked to this encounter.   Family/ staff Communication: ***  Labs/tests ordered:  ***

## 2023-11-16 ENCOUNTER — Other Ambulatory Visit: Payer: Self-pay | Admitting: Adult Health

## 2023-11-16 ENCOUNTER — Encounter: Payer: Self-pay | Admitting: Adult Health

## 2023-11-16 DIAGNOSIS — G4709 Other insomnia: Secondary | ICD-10-CM

## 2023-11-16 MED ORDER — BELSOMRA 10 MG PO TABS: 10.0000 mg | ORAL_TABLET | Freq: Every day | ORAL | 3 refills | Status: DC

## 2023-12-03 ENCOUNTER — Encounter: Payer: Self-pay | Admitting: Adult Health

## 2023-12-03 ENCOUNTER — Non-Acute Institutional Stay: Payer: Self-pay | Admitting: Adult Health

## 2023-12-03 DIAGNOSIS — I10 Essential (primary) hypertension: Secondary | ICD-10-CM | POA: Diagnosis not present

## 2023-12-03 DIAGNOSIS — R600 Localized edema: Secondary | ICD-10-CM | POA: Diagnosis not present

## 2023-12-03 DIAGNOSIS — F5101 Primary insomnia: Secondary | ICD-10-CM | POA: Diagnosis not present

## 2023-12-03 MED ORDER — FUROSEMIDE 20 MG PO TABS: 20.0000 mg | ORAL_TABLET | ORAL | Status: AC

## 2023-12-03 MED ORDER — POTASSIUM CHLORIDE CRYS ER 10 MEQ PO TBCR: 10.0000 meq | EXTENDED_RELEASE_TABLET | ORAL | Status: AC

## 2023-12-03 NOTE — Progress Notes (Signed)
 [facility] Room Number: 309-A Place of Service:  ALF [phone]) Provider:  Raylene Calamity, NP  PCP: Marguerite Shiley, MD  Patient Care Team: Marguerite Shiley, MD as PCP - General (Internal Medicine) Odie Benne, MD as PCP - Cardiology (Cardiology) Merriam Abbey, DO as Consulting Physician (Neurology)  Extended Emergency Contact Information Primary Emergency Contact: [facility] Address: [address]          [place], [place] [address] [place] Home Phone: [phone] Mobile Phone: [phone] Relation: Daughter Secondary Emergency Contact: [facility] Phone: [phone] Relation: Spouse Preferred language: English Interpreter needed? No  Code Status:  DNR Goals of care: Advanced Directive information    12/03/2023   12:40 PM  Advanced Directives  Does Patient Have a Medical Advance Directive? Yes  Type of Advance Directive Living will;Out of facility DNR (pink MOST or yellow form)  Does patient want to make changes to medical advance directive? No - Patient declined     Chief Complaint  Patient presents with   Edema    HPI:   The patient is an 88 year old who presents with edema on the left side of his face, right arm, and high blood pressure.  He has edema on the left side of his face which comes and goes, possibly due to sleeping a lot during the day and leaning on his side. There is no pain or redness in the area, and he is able to chew food appropriately.  He also has slight edema in his right hand and both ankles, without any associated pain.  His blood pressure today is 164/70 mmHg. Records indicate his systolic blood pressure typically ranges from 160s to 170s, and his diastolic pressure ranges from 34V to 80s. He is currently on amlodipine  10 mg and hydralazine , which was increased to four times a day two weeks ago for hypertension, providing only mild benefit.  He has been  experiencing altered sleep patterns, sleeping more during the day. His Belsomra  was increased to 20 mg due to lack of sleep, which improved his sleep last night, but  continues to sleep excessively during the day.  He resides in the memory care unit at [facility].  Past Medical History:  Diagnosis Date   CAD (coronary artery disease)    Bare meta stent RCA   Carotid stenosis    a. Carotid US  3/17: bilateral ICA 1-39% >> repeat 2 years   Coronary atherosclerosis of native coronary artery 08/24/2008   S/P PCI   Dementia 12/15/2021   concerns for Alzheimer's disease   Essential hypertension, benign 08/24/2008   Fatigue 08/08/2010   History of nuclear stress test    Myoview  12/16: EF 66%, very mild inferior ischemia; Low Risk   Mixed hyperlipidemia 08/24/2008   Myocardial infarction 10/06/2007   Nonproliferative diabetic retinopathy 08/08/2011   Nuclear sclerosis of both eyes 12/01/2017   Peripheral neuropathy    Pulmonary nodule, right 09/13/2007   Right upper lobe         Sinoatrial node dysfunction 08/24/2008   Chronotropic incompetence   Snoring 09/27/2009   Type II diabetes mellitus 08/08/2011   Past Surgical History:  Procedure Laterality Date   LEFT HEART CATHETERIZATION WITH CORONARY ANGIOGRAM N/A 03/04/2012   Procedure: LEFT HEART CATHETERIZATION WITH CORONARY ANGIOGRAM;  Surgeon: Odie Benne, MD;  Location: [facility] CATH LAB;  Service: Cardiovascular;  Laterality: N/A;   TONSILLECTOMY      Allergies  Allergen Reactions  Ace Inhibitors    Alpha Lipoic Acid [Lipoic Acid]     Upset Stomach   Invokana [Canagliflozin]    Heparin  Hives   Isosorbide  Mononitrate [Isosorbide  Dinitrate Er] Hives   Penicillins Hives   Sulfonamide Derivatives Hives    Allergies as of 12/03/2023       Reactions   Ace Inhibitors    Alpha Lipoic Acid [lipoic Acid]    Upset Stomach   Invokana [canagliflozin]    Heparin  Hives   Isosorbide  Mononitrate [isosorbide  Dinitrate Er] Hives    Penicillins Hives   Sulfonamide Derivatives Hives        Medication List        Accurate as of Dec 03, 2023  1:38 PM. If you have any questions, ask your nurse or doctor.          amLODipine  10 MG tablet Commonly known as: NORVASC  TAKE 1 TABLET ONCE DAILY.   aspirin  81 MG tablet Take 1 tablet (81 mg total) by mouth daily.   atorvastatin 10 MG tablet Commonly known as: LIPITOR Take 10 mg by mouth daily.   Belsomra  20 MG Tabs Generic drug: Suvorexant  Take 1 tablet by mouth at bedtime.   Belsomra  10 MG Tabs Generic drug: Suvorexant  Take 1 tablet (10 mg total) by mouth at bedtime.   citalopram 20 MG tablet Commonly known as: CELEXA Take 20 mg by mouth daily.   dextromethorphan-guaiFENesin 10-100 MG/5ML liquid Commonly known as: ROBITUSSIN-DM Take 10 mLs by mouth every 6 (six) hours as needed for cough.   famotidine 20 MG tablet Commonly known as: PEPCID Take 20 mg by mouth 2 (two) times daily.   feeding supplement (GLUCERNA SHAKE) Liqd Take 237 mLs by mouth at bedtime.   hydrALAZINE  25 MG tablet Commonly known as: APRESOLINE  Take 50 mg by mouth 4 (four) times daily.   hydrALAZINE  10 MG tablet Commonly known as: APRESOLINE  Take 10 mg by mouth 3 (three) times daily as needed (for SBP >175).   hydrOXYzine  25 MG capsule Commonly known as: VISTARIL  Take 25 mg by mouth in the morning and at bedtime. Give at 3 and 5pm   hydrOXYzine  25 MG capsule Commonly known as: VISTARIL  Take 1 capsule (25 mg total) by mouth 2 (two) times daily as needed (increased agitation/ sundowning).   insulin aspart 100 UNIT/ML injection Commonly known as: novoLOG Inject 7 Units into the skin 2 (two) times daily. With lunch and Dinner Only if CBGS more then 200   Lantus SoloStar 100 UNIT/ML Solostar Pen Generic drug: insulin glargine Inject 20 Units into the skin daily.   latanoprost 0.005 % ophthalmic solution Commonly known as: XALATAN Place 1 drop into both eyes at  bedtime.   memantine  10 MG tablet Commonly known as: NAMENDA  TAKE ONE TABLET BY MOUTH EVERY MORNING and TAKE ONE TABLET BY MOUTH EVERYDAY AT BEDTIME   metoCLOPramide  5 MG tablet Commonly known as: REGLAN  Take 5 mg by mouth 3 (three) times daily before meals. 7:30 am, 11 am, and 5 pm   mirtazapine  15 MG disintegrating tablet Commonly known as: REMERON  SOL-TAB Take 1 tablet (15 mg total) by mouth at bedtime.   omeprazole  20 MG capsule Commonly known as: PRILOSEC  Take 40 mg by mouth daily.   ondansetron  4 MG disintegrating tablet Commonly known as: ZOFRAN -ODT Take 4 mg by mouth as directed. Give 30 minutes prior to meals before Meals; 07:30 AM, 11:00 AM, 05:00 PM   ondansetron  4 MG tablet Commonly known as: ZOFRAN  Take 4 mg by mouth  every 6 (six) hours as needed for nausea or vomiting.   ondansetron  4 MG/5ML solution Commonly known as: ZOFRAN  every 8 (eight) hours as needed for nausea or vomiting.   ondansetron  4 MG tablet Commonly known as: Zofran  Take 1 tablet (4 mg total) by mouth in the morning, at noon, and at bedtime.   polyethylene glycol 17 g packet Commonly known as: MiraLax  Take 17 g by mouth daily.        Review of Systems  Unable to perform ROS: Dementia    Immunization History  Administered Date(s) Administered   Fluad Quad(high Dose 65+) 04/08/2022, 04/28/2023   Influenza Split 05/01/2010, 03/11/2011, 04/06/2012, 04/21/2013, 05/03/2021   Influenza Whole 03/07/2009   Influenza, High Dose Seasonal PF 03/29/2014, 04/02/2015, 03/31/2016, 04/08/2017, 04/16/2018   Influenza-Unspecified 04/16/2019, 03/29/2020   Moderna Covid-19 Fall Seasonal Vaccine 50yrs & older 05/07/2022   Moderna Covid-19 Vaccine Bivalent Booster 28yrs & up 04/28/2023   Moderna Sars-Covid-2 Vaccination 07/19/2019, 08/17/2019, 06/01/2020, 12/09/2021   Pneumococcal Conjugate-13 03/29/2014   Pneumococcal Polysaccharide-23 10/31/2003, 09/07/2006, 03/25/2013   Td (Adult) 10/30/1993   Tdap  07/07/2010, 03/19/2011, 04/16/2023   Zoster Recombinant(Shingrix) 10/24/2016, 12/24/2016   Zoster, Live 10/30/2005, 10/21/2016, 12/24/2016   Zoster, Unspecified 10/24/2016   Pertinent  Health Maintenance Due  Topic Date Due   OPHTHALMOLOGY EXAM  07/30/2023   INFLUENZA VACCINE  02/05/2024   HEMOGLOBIN A1C  04/20/2024   FOOT EXAM  07/08/2024      03/31/2023    1:05 PM 04/09/2023    4:03 PM 04/20/2023    4:07 PM 05/22/2023   10:57 AM 07/20/2023    1:17 PM  Fall Risk  Falls in the past year? 1 1 0 0 0  Was there an injury with Fall? 1 1 0 0 0  Fall Risk Category Calculator 3 2 0 0 0  Patient at Risk for Falls Due to  History of fall(s) No Fall Risks No Fall Risks No Fall Risks  Fall risk Follow up Falls evaluation completed Falls evaluation completed Falls evaluation completed Falls evaluation completed Falls evaluation completed   Functional Status Survey:    Vitals:   12/03/23 1239  BP: (!) 162/70  Pulse: (!) 58  Resp: 16  Temp: (!) 97.2 F (36.2 C)  SpO2: 92%  Weight: 182 lb 9.6 oz (82.8 kg)  Height: 6' (1.829 m)   Body mass index is 24.77 kg/m. Physical Exam Vitals and nursing note reviewed.  Constitutional:      Appearance: Normal appearance.     Comments: Asleep but arouses  HENT:     Head: Normocephalic and atraumatic.     Nose: Nose normal.     Mouth/Throat:     Mouth: Mucous membranes are moist.     Pharynx: Oropharynx is clear.     Comments: No erythema to gums, no pain with palpation of his teeth with a tongue blade Eyes:     Conjunctiva/sclera: Conjunctivae normal.     Pupils: Pupils are equal, round, and reactive to light.  Cardiovascular:     Rate and Rhythm: Normal rate and regular rhythm.     Heart sounds: No murmur heard. Pulmonary:     Effort: Pulmonary effort is normal. No respiratory distress.     Breath sounds: Normal breath sounds. No wheezing.  Abdominal:     General: Bowel sounds are normal. There is no distension.     Palpations:  Abdomen is soft.     Tenderness: There is no abdominal tenderness.  Musculoskeletal:  General: Swelling (Left side of face, right hand, bilateral ankles non pitting) present.     Cervical back: Normal range of motion. No rigidity.  Lymphadenopathy:     Cervical: No cervical adenopathy.  Skin:    General: Skin is warm and dry.  Neurological:     General: No focal deficit present.     Mental Status: Mental status is at baseline.     Comments: Sleepy but arouses and able to f/c and take a deep breath for exam      Labs reviewed: Recent Labs    01/14/23 0410 02/24/23 0000 07/21/23 0000 09/21/23 0000 10/20/23 0000  NA 140   < > 141 143 139  K 4.3   < > 4.1 4.4 4.8  CL 107   < > 106 106 104  CO2 23   < > 27* 23* 22  GLUCOSE 93  --   --   --   --   BUN 30*   < > 36* 19 18  CREATININE 1.61*   < > 1.5* 1.3 1.5*  CALCIUM 9.1   < > 8.4* 8.9 8.6*   < > = values in this interval not displayed.   Recent Labs    02/24/23 0000 09/21/23 0000  AST 32 32  ALT 25 29  ALKPHOS 104 125  ALBUMIN 3.9 3.7   Recent Labs    01/14/23 0410 02/24/23 0000 09/21/23 0000  WBC 8.5 5.7 5.2  NEUTROABS 6.7  --   --   HGB 12.1* 11.9* 12.1*  HCT 35.9* 36* 36*  MCV 94.7  --   --   PLT 217 294 252   Lab Results  Component Value Date   TSH 2.58 08/19/2022   Lab Results  Component Value Date   HGBA1C 7.5 10/20/2023   Lab Results  Component Value Date   CHOL 136 08/19/2022   HDL 37 08/19/2022   LDLCALC 80 08/19/2022   TRIG 96 08/19/2022   CHOLHDL 3.0 09/04/2007    Significant Diagnostic Results in last 30 days:  No results found.  Assessment/Plan  Hypertension Systolic 160s-170s, diastolic 95J-88C.  Better control expected to improve edema. - Continue amlodipine  10 mg, lasix will mitigate effects.  - Initiate furosemide 20 mg MWF with potassium. - Recheck BMP in two weeks.  Edema Edema on left face, right hand, both ankles. Likely due to hypertension and immobility    Insomnia Increased daytime somnolence. Belsomra  increased to 20 mg by geropsych to help improve sleep wake cycle waiting on effects.   Total time :  time greater than 50% of total time spent doing pt counseling and coordination of care

## 2023-12-22 ENCOUNTER — Other Ambulatory Visit: Payer: Self-pay | Admitting: Orthopedic Surgery

## 2023-12-22 DIAGNOSIS — G309 Alzheimer's disease, unspecified: Secondary | ICD-10-CM

## 2023-12-22 DIAGNOSIS — G4700 Insomnia, unspecified: Secondary | ICD-10-CM

## 2023-12-22 MED ORDER — BELSOMRA 20 MG PO TABS
1.0000 | ORAL_TABLET | Freq: Every day | ORAL | 3 refills | Status: DC
Start: 2023-12-22 — End: 2024-04-19

## 2024-02-04 ENCOUNTER — Non-Acute Institutional Stay: Payer: Self-pay | Admitting: Adult Health

## 2024-02-04 DIAGNOSIS — G301 Alzheimer's disease with late onset: Secondary | ICD-10-CM

## 2024-02-04 DIAGNOSIS — Z794 Long term (current) use of insulin: Secondary | ICD-10-CM

## 2024-02-04 DIAGNOSIS — N1831 Chronic kidney disease, stage 3a: Secondary | ICD-10-CM

## 2024-02-04 DIAGNOSIS — F0283 Dementia in other diseases classified elsewhere, unspecified severity, with mood disturbance: Secondary | ICD-10-CM

## 2024-02-04 DIAGNOSIS — E782 Mixed hyperlipidemia: Secondary | ICD-10-CM | POA: Diagnosis not present

## 2024-02-04 DIAGNOSIS — I10 Essential (primary) hypertension: Secondary | ICD-10-CM

## 2024-02-04 DIAGNOSIS — F325 Major depressive disorder, single episode, in full remission: Secondary | ICD-10-CM | POA: Diagnosis not present

## 2024-02-04 DIAGNOSIS — E113293 Type 2 diabetes mellitus with mild nonproliferative diabetic retinopathy without macular edema, bilateral: Secondary | ICD-10-CM

## 2024-02-04 DIAGNOSIS — I2510 Atherosclerotic heart disease of native coronary artery without angina pectoris: Secondary | ICD-10-CM

## 2024-02-05 ENCOUNTER — Encounter: Payer: Self-pay | Admitting: Adult Health

## 2024-02-05 DIAGNOSIS — R1110 Vomiting, unspecified: Secondary | ICD-10-CM | POA: Insufficient documentation

## 2024-02-05 MED ORDER — MIRTAZAPINE 30 MG PO TBDP: 30.0000 mg | ORAL_TABLET | Freq: Every day | ORAL | Status: AC

## 2024-02-05 MED ORDER — MIRTAZAPINE 30 MG PO TBDP: 15.0000 mg | ORAL_TABLET | Freq: Every day | ORAL | Status: DC

## 2024-02-05 NOTE — Progress Notes (Signed)
 Location:  Medical illustrator of Service:  ALF [phone]) Provider:  Tanishka Drolet,NP  Charlanne Fredia CROME, MD  Patient Care Team: Charlanne Fredia CROME, MD as PCP - General (Internal Medicine) Verlin Lonni BIRCH, MD as PCP - Cardiology (Cardiology) Skeet Juliene SAUNDERS, DO as Consulting Physician (Neurology)  Extended Emergency Contact Information Primary Emergency Contact: [facility] Address: [address]          [place], [place] [address] [place] Home Phone: [phone] Mobile Phone: [phone] Relation: Daughter Secondary Emergency Contact: Tullis,Anne Mobile Phone: [phone] Relation: Spouse Preferred language: English Interpreter needed? No  Code Status:  DNR Goals of care: Advanced Directive information    12/03/2023   12:40 PM  Advanced Directives  Does Patient Have a Medical Advance Directive? Yes  Type of Advance Directive Living will;Out of facility DNR (pink MOST or yellow form)  Does patient want to make changes to medical advance directive? No - Patient declined     Chief Complaint  Patient presents with   Medical Management of Chronic Issues    HPI:  Pt is a 88 y.o. male seen today for medical management of chronic diseases.    PMH significant for DM II, dementia, AD, carotid stenosis, HTN, vomiting with gastroparesis, GERD, HLD CAD with PCI  DM II: CBG 90-204 A1C 7.5. Current on meal coverage and lantus Eye exam August of 2024  Dementia: MMSE 15/30  Now requires a hoyer lift for tranfers Behaviors have improved. Remeron  increased 7/18 due to lethargy which has improved.   Vomiting: no current issues with PPI, pepcid, Zofran   and Reglan . Possible gastroparesis vs med s/e  Abd ultrasound 08/2023 revealed a questionable finding in the common bile duct, but no gallstones or sludge were detected. The gallbladder visualization was limited by gas, and no significant abnormalities were noted in the liver, right kidney, or  pancreas.  KUBs have been negative.   CKD BUN 18 Cr 1.5 10/20/23  HTN: 130-170/60-90  CAD with hx of PCI on statin and asa  Past Medical History:  Diagnosis Date   CAD (coronary artery disease)    Bare meta stent RCA   Carotid stenosis    a. Carotid US  3/17: bilateral ICA 1-39% >> repeat 2 years   Coronary atherosclerosis of native coronary artery 08/24/2008   S/P PCI   Dementia 12/15/2021   concerns for Alzheimer's disease   Essential hypertension, benign 08/24/2008   Fatigue 08/08/2010   History of nuclear stress test    Myoview  12/16: EF 66%, very mild inferior ischemia; Low Risk   Mixed hyperlipidemia 08/24/2008   Myocardial infarction 10/06/2007   Nonproliferative diabetic retinopathy 08/08/2011   Nuclear sclerosis of both eyes 12/01/2017   Peripheral neuropathy    Pulmonary nodule, right 09/13/2007   Right upper lobe         Sinoatrial node dysfunction 08/24/2008   Chronotropic incompetence   Snoring 09/27/2009   Type II diabetes mellitus 08/08/2011   Past Surgical History:  Procedure Laterality Date   LEFT HEART CATHETERIZATION WITH CORONARY ANGIOGRAM N/A 03/04/2012   Procedure: LEFT HEART CATHETERIZATION WITH CORONARY ANGIOGRAM;  Surgeon: Lonni BIRCH Verlin, MD;  Location: [facility] CATH LAB;  Service: Cardiovascular;  Laterality: N/A;   TONSILLECTOMY      Allergies  Allergen Reactions   Ace Inhibitors    Alpha Lipoic Acid [Lipoic Acid]     Upset Stomach   Invokana [Canagliflozin]    Heparin  Hives   Isosorbide  Mononitrate [Isosorbide  Dinitrate Er] Hives  Penicillins Hives   Sulfonamide Derivatives Hives    Outpatient Encounter Medications as of 02/04/2024  Medication Sig   amLODipine  (NORVASC ) 10 MG tablet TAKE 1 TABLET ONCE DAILY.   aspirin  81 MG tablet Take 1 tablet (81 mg total) by mouth daily.   atorvastatin (LIPITOR) 10 MG tablet Take 10 mg by mouth daily.   citalopram (CELEXA) 20 MG tablet Take 20 mg by mouth daily.   dextromethorphan-guaiFENesin  (ROBITUSSIN-DM) 10-100 MG/5ML liquid Take 10 mLs by mouth every 6 (six) hours as needed for cough.   famotidine (PEPCID) 20 MG tablet Take 20 mg by mouth 2 (two) times daily.   feeding supplement, GLUCERNA SHAKE, (GLUCERNA SHAKE) LIQD Take 237 mLs by mouth at bedtime.   furosemide  (LASIX ) 20 MG tablet Take 1 tablet (20 mg total) by mouth 3 (three) times a week.   hydrALAZINE  (APRESOLINE ) 10 MG tablet Take 10 mg by mouth 3 (three) times daily as needed (for SBP >175).   hydrALAZINE  (APRESOLINE ) 25 MG tablet Take 50 mg by mouth 4 (four) times daily.   hydrOXYzine  (VISTARIL ) 25 MG capsule Take 1 capsule (25 mg total) by mouth 2 (two) times daily as needed (increased agitation/ sundowning).   hydrOXYzine  (VISTARIL ) 25 MG capsule Take 25 mg by mouth in the morning and at bedtime. Give at 3 and 5pm   insulin aspart (NOVOLOG) 100 UNIT/ML injection Inject 7 Units into the skin 2 (two) times daily. With lunch and Dinner Only if CBGS more then 200   LANTUS SOLOSTAR 100 UNIT/ML injection Inject 20 Units into the skin daily.   latanoprost (XALATAN) 0.005 % ophthalmic solution Place 1 drop into both eyes at bedtime.   memantine  (NAMENDA ) 10 MG tablet TAKE ONE TABLET BY MOUTH EVERY MORNING and TAKE ONE TABLET BY MOUTH EVERYDAY AT BEDTIME   metoCLOPramide  (REGLAN ) 5 MG tablet Take 5 mg by mouth 3 (three) times daily before meals. 7:30 am, 11 am, and 5 pm   mirtazapine  (REMERON  SOL-TAB) 15 MG disintegrating tablet Take 1 tablet (15 mg total) by mouth at bedtime.   omeprazole  (PRILOSEC ) 20 MG capsule Take 40 mg by mouth daily.   ondansetron  (ZOFRAN ) 4 MG tablet Take 4 mg by mouth every 6 (six) hours as needed for nausea or vomiting.   ondansetron  (ZOFRAN ) 4 MG tablet Take 1 tablet (4 mg total) by mouth in the morning, at noon, and at bedtime.   ondansetron  (ZOFRAN ) 4 MG/5ML solution every 8 (eight) hours as needed for nausea or vomiting.   ondansetron  (ZOFRAN -ODT) 4 MG disintegrating tablet Take 4 mg by mouth as  directed. Give 30 minutes prior to meals before Meals; 07:30 AM, 11:00 AM, 05:00 PM (Patient not taking: Reported on 12/03/2023)   polyethylene glycol (MIRALAX ) 17 g packet Take 17 g by mouth daily.   potassium chloride  (KLOR-CON  M) 10 MEQ tablet Take 1 tablet (10 mEq total) by mouth 3 (three) times a week.   Suvorexant  (BELSOMRA ) 20 MG TABS Take 1 tablet (20 mg total) by mouth at bedtime.   No facility-administered encounter medications on file as of 02/04/2024.    Review of Systems  Unable to perform ROS: Dementia    Immunization History  Administered Date(s) Administered   Fluad  Quad(high Dose 65+) 04/08/2022, 04/28/2023   Influenza Split 05/01/2010, 03/11/2011, 04/06/2012, 04/21/2013, 05/03/2021   Influenza Whole 03/07/2009   Influenza, High Dose Seasonal PF 03/29/2014, 04/02/2015, 03/31/2016, 04/08/2017, 04/16/2018   Influenza-Unspecified 04/16/2019, 03/29/2020   Moderna Covid-19 Fall Seasonal Vaccine 39yrs & older 05/07/2022  Moderna Covid-19 Vaccine Bivalent Booster 85yrs & up 04/28/2023   Moderna Sars-Covid-2 Vaccination 07/19/2019, 08/17/2019, 06/01/2020, 12/09/2021   Pneumococcal Conjugate-13 03/29/2014   Pneumococcal Polysaccharide-23 10/31/2003, 09/07/2006, 03/25/2013   Td (Adult) 10/30/1993   Tdap 07/07/2010, 03/19/2011, 04/16/2023   Zoster Recombinant(Shingrix) 10/24/2016, 12/24/2016   Zoster, Live 10/30/2005, 10/21/2016, 12/24/2016   Zoster, Unspecified 10/24/2016   Pertinent  Health Maintenance Due  Topic Date Due   OPHTHALMOLOGY EXAM  07/30/2023   INFLUENZA VACCINE  02/05/2024   HEMOGLOBIN A1C  04/20/2024   FOOT EXAM  07/08/2024      03/31/2023    1:05 PM 04/09/2023    4:03 PM 04/20/2023    4:07 PM 05/22/2023   10:57 AM 07/20/2023    1:17 PM  Fall Risk  Falls in the past year? 1 1 0 0 0  Was there an injury with Fall? 1 1 0 0 0  Fall Risk Category Calculator 3 2 0 0 0  Patient at Risk for Falls Due to  History of fall(s) No Fall Risks No Fall Risks No Fall  Risks  Fall risk Follow up Falls evaluation completed Falls evaluation completed Falls evaluation completed Falls evaluation completed Falls evaluation completed   Functional Status Survey:    Vitals:   02/05/24 0733  BP: (!) 156/82  Pulse: 60  Resp: 19  Temp: (!) 97.2 F (36.2 C)  SpO2: 93%  Weight: 181 lb 6.4 oz (82.3 kg)   Body mass index is 24.6 kg/m. Wt Readings from Last 3 Encounters:  02/05/24 181 lb 6.4 oz (82.3 kg)  12/03/23 182 lb 9.6 oz (82.8 kg)  11/13/23 182 lb 9.6 oz (82.8 kg)    Physical Exam Vitals and nursing note reviewed.  Constitutional:      Appearance: Normal appearance.  HENT:     Head: Normocephalic and atraumatic.  Cardiovascular:     Rate and Rhythm: Normal rate and regular rhythm.     Heart sounds: No murmur heard. Pulmonary:     Effort: Pulmonary effort is normal. No respiratory distress.     Breath sounds: Normal breath sounds. No wheezing.  Abdominal:     General: Bowel sounds are normal. There is no distension.     Palpations: Abdomen is soft.     Tenderness: There is no abdominal tenderness.  Musculoskeletal:     Cervical back: Normal range of motion. No rigidity.     Right lower leg: No edema.     Left lower leg: No edema.  Lymphadenopathy:     Cervical: No cervical adenopathy.  Skin:    General: Skin is warm and dry.     Comments:    Neurological:     General: No focal deficit present.     Mental Status: Mental status is at baseline.  Psychiatric:        Mood and Affect: Mood normal.       Labs reviewed: Recent Labs    07/21/23 0000 09/21/23 0000 10/20/23 0000  NA 141 143 139  K 4.1 4.4 4.8  CL 106 106 104  CO2 27* 23* 22  BUN 36* 19 18  CREATININE 1.5* 1.3 1.5*  CALCIUM 8.4* 8.9 8.6*   Recent Labs    02/24/23 0000 09/21/23 0000  AST 32 32  ALT 25 29  ALKPHOS 104 125  ALBUMIN 3.9 3.7   Recent Labs    02/24/23 0000 09/21/23 0000  WBC 5.7 5.2  HGB 11.9* 12.1*  HCT 36* 36*  PLT 294 252  Lab  Results  Component Value Date   TSH 2.58 08/19/2022   Lab Results  Component Value Date   HGBA1C 7.5 10/20/2023   Lab Results  Component Value Date   CHOL 136 08/19/2022   HDL 37 08/19/2022   LDLCALC 80 08/19/2022   TRIG 96 08/19/2022   CHOLHDL 3.0 09/04/2007    Significant Diagnostic Results in last 30 days:  No results found.  Assessment/Plan  1. Vomiting, unspecified vomiting type, unspecified whether nausea present (Primary) Doing well on current regimen Possibly due to gastroparesis   2. 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, with long-term current use of insulin, macular edema presence unspecified (HCC) Needs A1C Continue meal coverage and lantus  3. Essential hypertension Periods of HTN, has prn hydralazine  for use Given his goals of care are bp lability would treat with prn med.   4. Alzheimer's dementia with behavioral disturbance (HCC) Progressive decline in cognition and physical function c/w the disease. Continue supportive care in the skilled environment. Behaviors are much improved Less lethargy   5. Stage 3a chronic kidney disease (HCC) Continue to periodically monitor BMP and avoid nephrotoxic agents  6. Mixed hyperlipidemia Continue lipitor   7. Coronary artery disease involving native coronary artery of native heart without angina pectoris Lipitor and baby asa   8. Depression Remeron  was increased due to lethargy (higher med dose has paradoxical effect) More alert now No signs of depression     Total time :  time greater than 50% of total time spent doing pt counseling and coordination of care

## 2024-02-08 LAB — BASIC METABOLIC PANEL WITH GFR
BUN: 29 — AB (ref 4–21)
CO2: 23 — AB (ref 13–22)
Chloride: 105 (ref 99–108)
Creatinine: 1.7 — AB (ref 0.6–1.3)
Glucose: 114
Potassium: 4.3 meq/L (ref 3.5–5.1)
Sodium: 142 (ref 137–147)

## 2024-02-08 LAB — COMPREHENSIVE METABOLIC PANEL WITH GFR: Calcium: 8.9 (ref 8.7–10.7)

## 2024-02-08 LAB — HEMOGLOBIN A1C: Hemoglobin A1C: 7.4

## 2024-03-02 ENCOUNTER — Encounter: Payer: Self-pay | Admitting: Orthopedic Surgery

## 2024-03-02 ENCOUNTER — Non-Acute Institutional Stay (SKILLED_NURSING_FACILITY): Payer: Self-pay | Admitting: Orthopedic Surgery

## 2024-03-02 DIAGNOSIS — J90 Pleural effusion, not elsewhere classified: Secondary | ICD-10-CM

## 2024-03-02 DIAGNOSIS — R051 Acute cough: Secondary | ICD-10-CM

## 2024-03-02 DIAGNOSIS — J189 Pneumonia, unspecified organism: Secondary | ICD-10-CM | POA: Diagnosis not present

## 2024-03-02 NOTE — Progress Notes (Unsigned)
 [facility] Room Number: 309-P Place of Service:  SNF [phone]) Provider:  Greig Cluster, NP  PCP: Charlanne Fredia CROME, MD  Patient Care Team: Charlanne Fredia CROME, MD as PCP - General (Internal Medicine) Verlin Lonni BIRCH, MD as PCP - Cardiology (Cardiology) Skeet Juliene SAUNDERS, DO as Consulting Physician (Neurology)  Extended Emergency Contact Information Primary Emergency Contact: [facility] Address: [address]          [place], [place] [address] [place] Home Phone: [phone] Mobile Phone: [phone] Relation: Daughter Secondary Emergency Contact: [facility] Phone: [phone] Relation: Spouse Preferred language: English Interpreter needed? No  Code Status:  DNR Goals of care: Advanced Directive information    03/02/2024    1:03 PM  Advanced Directives  Does Patient Have a Medical Advance Directive? Yes  Type of Advance Directive Living will;Out of facility DNR (pink MOST or yellow form)  Does patient want to make changes to medical advance directive? No - Patient declined     Chief Complaint  Patient presents with   Cough    HPI:  Pt is a 88 y.o. male seen today for acute visit due to cough.   He currently resides on the memory care unit at [facility] PMH: CAD, HTN, MI, OSA, pulmonary nodule, T2DM with retinopathy/neuropathy, CKD, dementia, depression and unstable gait.   Poor historian due to dementia. Nursing reports increased cough and malaise x 1 day. H/o CHF.    Past Medical History:  Diagnosis Date   CAD (coronary artery disease)    Bare meta stent RCA   Carotid stenosis    a. Carotid US  3/17: bilateral ICA 1-39% >> repeat 2 years   Coronary atherosclerosis of native coronary artery 08/24/2008   S/P PCI   Dementia 12/15/2021   concerns for Alzheimer's disease   Essential hypertension, benign 08/24/2008   Fatigue 08/08/2010   History of nuclear stress test    Myoview  12/16: EF  66%, very mild inferior ischemia; Low Risk   Mixed hyperlipidemia 08/24/2008   Myocardial infarction 10/06/2007   Nonproliferative diabetic retinopathy 08/08/2011   Nuclear sclerosis of both eyes 12/01/2017   Peripheral neuropathy    Pulmonary nodule, right 09/13/2007   Right upper lobe         Sinoatrial node dysfunction 08/24/2008   Chronotropic incompetence   Snoring 09/27/2009   Type II diabetes mellitus 08/08/2011   Past Surgical History:  Procedure Laterality Date   LEFT HEART CATHETERIZATION WITH CORONARY ANGIOGRAM N/A 03/04/2012   Procedure: LEFT HEART CATHETERIZATION WITH CORONARY ANGIOGRAM;  Surgeon: Lonni BIRCH Verlin, MD;  Location: [facility] CATH LAB;  Service: Cardiovascular;  Laterality: N/A;   TONSILLECTOMY      Allergies  Allergen Reactions   Ace Inhibitors    Alpha Lipoic Acid [Lipoic Acid]     Upset Stomach   Invokana [Canagliflozin]    Heparin  Hives   Isosorbide  Mononitrate [Isosorbide  Dinitrate Er] Hives   Penicillins Hives   Sulfonamide Derivatives Hives    Allergies as of 03/02/2024       Reactions   Ace Inhibitors    Alpha Lipoic Acid [lipoic Acid]    Upset Stomach   Invokana [canagliflozin]    Heparin  Hives   Isosorbide  Mononitrate [isosorbide  Dinitrate Er] Hives   Penicillins Hives   Sulfonamide Derivatives Hives        Medication List        Accurate as of March 02, 2024  3:00 PM. If you have any  questions, ask your nurse or doctor.          amLODipine  10 MG tablet Commonly known as: NORVASC  TAKE 1 TABLET ONCE DAILY.   aspirin  81 MG tablet Take 1 tablet (81 mg total) by mouth daily.   atorvastatin 10 MG tablet Commonly known as: LIPITOR Take 10 mg by mouth daily.   BD AutoShield Duo 30G X 5 MM Misc Generic drug: Insulin Pen Needle 1 Needle by Does not apply route 3 (three) times daily.   Belsomra  20 MG Tabs Generic drug: Suvorexant  Take 1 tablet (20 mg total) by mouth at bedtime.   citalopram 20 MG tablet Commonly  known as: CELEXA Take 20 mg by mouth daily.   dextromethorphan-guaiFENesin 10-100 MG/5ML liquid Commonly known as: ROBITUSSIN-DM Take 10 mLs by mouth every 6 (six) hours as needed for cough.   famotidine 20 MG tablet Commonly known as: PEPCID Take 20 mg by mouth 2 (two) times daily.   feeding supplement (GLUCERNA SHAKE) Liqd Take 237 mLs by mouth at bedtime.   furosemide  20 MG tablet Commonly known as: Lasix  Take 1 tablet (20 mg total) by mouth 3 (three) times a week.   hydrALAZINE  25 MG tablet Commonly known as: APRESOLINE  Take 50 mg by mouth 4 (four) times daily.   hydrALAZINE  10 MG tablet Commonly known as: APRESOLINE  Take 10 mg by mouth 3 (three) times daily as needed (for SBP >175).   hydrOXYzine  25 MG capsule Commonly known as: VISTARIL  Take 50 mg by mouth at bedtime.   hydrOXYzine  25 MG capsule Commonly known as: VISTARIL  Take 1 capsule (25 mg total) by mouth 2 (two) times daily as needed (increased agitation/ sundowning).   insulin aspart 100 UNIT/ML injection Commonly known as: novoLOG Inject 7 Units into the skin 2 (two) times daily. With lunch and Dinner Only if CBGS more then 200   Lantus SoloStar 100 UNIT/ML Solostar Pen Generic drug: insulin glargine Inject 20 Units into the skin daily.   latanoprost 0.005 % ophthalmic solution Commonly known as: XALATAN Place 1 drop into both eyes at bedtime.   memantine  10 MG tablet Commonly known as: NAMENDA  TAKE ONE TABLET BY MOUTH EVERY MORNING and TAKE ONE TABLET BY MOUTH EVERYDAY AT BEDTIME   metoCLOPramide  5 MG tablet Commonly known as: REGLAN  Take 5 mg by mouth as directed. Orally before meals.   mirtazapine  30 MG disintegrating tablet Commonly known as: REMERON  SOL-TAB Take 1 tablet (30 mg total) by mouth at bedtime.   omeprazole  20 MG capsule Commonly known as: PRILOSEC  Take 40 mg by mouth daily.   omeprazole  40 MG capsule Commonly known as: PRILOSEC  Take 40 mg by mouth daily before breakfast.    ondansetron  4 MG tablet Commonly known as: ZOFRAN  Take 4 mg by mouth every 6 (six) hours as needed for nausea or vomiting.   ondansetron  4 MG/5ML solution Commonly known as: ZOFRAN  every 8 (eight) hours as needed for nausea or vomiting.   ondansetron  4 MG tablet Commonly known as: ZOFRAN  Take 4 mg by mouth as directed. Give 30 min prior to meals.   ondansetron  4 MG tablet Commonly known as: Zofran  Take 1 tablet (4 mg total) by mouth in the morning, at noon, and at bedtime.   polyethylene glycol 17 g packet Commonly known as: MiraLax  Take 17 g by mouth daily.   potassium chloride  10 MEQ tablet Commonly known as: KLOR-CON  M Take 1 tablet (10 mEq total) by mouth 3 (three) times a week.  Review of Systems  Immunization History  Administered Date(s) Administered   Fluad  Quad(high Dose 65+) 04/08/2022, 04/28/2023   Fluzone Influenza virus vaccine,trivalent (IIV3), split virus 03/11/2011, 05/03/2021   INFLUENZA, HIGH DOSE SEASONAL PF 03/29/2014, 04/02/2015, 03/31/2016, 04/08/2017, 04/16/2018   Influenza Split 05/01/2010, 04/06/2012, 04/21/2013   Influenza Whole 03/07/2009   Influenza-Unspecified 04/16/2019, 03/29/2020   Moderna Covid-19 Fall Seasonal Vaccine 71yrs & older 05/07/2022   Moderna Covid-19 Vaccine Bivalent Booster 55yrs & up 04/28/2023   Moderna Sars-Covid-2 Vaccination 07/19/2019, 08/17/2019, 06/01/2020, 12/09/2021   Pneumococcal Conjugate-13 03/29/2014   Pneumococcal Polysaccharide-23 10/31/2003, 09/07/2006, 03/25/2013   Td (Adult) 10/30/1993   Tdap 07/07/2010, 03/19/2011, 04/16/2023   Zoster Recombinant(Shingrix) 10/24/2016, 12/24/2016   Zoster, Live 10/30/2005, 10/21/2016, 12/24/2016   Zoster, Unspecified 10/24/2016   Pertinent  Health Maintenance Due  Topic Date Due   OPHTHALMOLOGY EXAM  07/30/2023   INFLUENZA VACCINE  02/05/2024   FOOT EXAM  07/08/2024   HEMOGLOBIN A1C  08/10/2024      03/31/2023    1:05 PM 04/09/2023    4:03 PM 04/20/2023     4:07 PM 05/22/2023   10:57 AM 07/20/2023    1:17 PM  Fall Risk  Falls in the past year? 1 1 0 0 0  Was there an injury with Fall? 1 1 0 0 0  Fall Risk Category Calculator 3 2 0 0 0  Patient at Risk for Falls Due to  History of fall(s) No Fall Risks No Fall Risks No Fall Risks  Fall risk Follow up Falls evaluation completed Falls evaluation completed Falls evaluation completed Falls evaluation completed Falls evaluation completed   Functional Status Survey:    Vitals:   03/02/24 1259  BP: (!) 155/76  Pulse: 62  Resp: 16  Temp: (!) 97.3 F (36.3 C)  SpO2: 95%  Weight: 179 lb (81.2 kg)  Height: 6' (1.829 m)   Body mass index is 24.28 kg/m. Physical Exam  Labs reviewed: Recent Labs    09/21/23 0000 10/20/23 0000 02/08/24 0000  NA 143 139 142  K 4.4 4.8 4.3  CL 106 104 105  CO2 23* 22 23*  BUN 19 18 29*  CREATININE 1.3 1.5* 1.7*  CALCIUM 8.9 8.6* 8.9   Recent Labs    09/21/23 0000  AST 32  ALT 29  ALKPHOS 125  ALBUMIN 3.7   Recent Labs    09/21/23 0000  WBC 5.2  HGB 12.1*  HCT 36*  PLT 252   Lab Results  Component Value Date   TSH 2.58 08/19/2022   Lab Results  Component Value Date   HGBA1C 7.4 02/08/2024   Lab Results  Component Value Date   CHOL 136 08/19/2022   HDL 37 08/19/2022   LDLCALC 80 08/19/2022   TRIG 96 08/19/2022   CHOLHDL 3.0 09/04/2007    Significant Diagnostic Results in last 30 days:  No results found.  Assessment/Plan There are no diagnoses linked to this encounter.   Family/ staff Communication:   Labs/tests ordered:

## 2024-03-03 MED ORDER — DOXYCYCLINE HYCLATE 100 MG PO TABS: 100.0000 mg | ORAL_TABLET | Freq: Two times a day (BID) | ORAL | Status: AC

## 2024-03-03 MED ORDER — POTASSIUM CHLORIDE CRYS ER 20 MEQ PO TBCR: 20.0000 meq | EXTENDED_RELEASE_TABLET | Freq: Every day | ORAL | Status: DC

## 2024-03-03 MED ORDER — FUROSEMIDE 40 MG PO TABS
40.0000 mg | ORAL_TABLET | Freq: Every day | ORAL | Status: DC
Start: 2024-03-03 — End: 2024-03-29

## 2024-03-07 LAB — BASIC METABOLIC PANEL WITH GFR
BUN: 33 — AB (ref 4–21)
CO2: 25 — AB (ref 13–22)
Chloride: 107 (ref 99–108)
Creatinine: 1.6 — AB (ref 0.6–1.3)
Glucose: 122
Potassium: 4.4 meq/L (ref 3.5–5.1)
Sodium: 144 (ref 137–147)

## 2024-03-07 LAB — COMPREHENSIVE METABOLIC PANEL WITH GFR
Calcium: 8.9 (ref 8.7–10.7)
eGFR: 41

## 2024-03-28 ENCOUNTER — Other Ambulatory Visit: Payer: Self-pay

## 2024-03-28 ENCOUNTER — Emergency Department (HOSPITAL_BASED_OUTPATIENT_CLINIC_OR_DEPARTMENT_OTHER)
Admission: EM | Admit: 2024-03-28 | Discharge: 2024-03-29 | Disposition: A | Discharge status: Home / Self Care | Attending: Emergency Medicine | Admitting: Emergency Medicine

## 2024-03-28 DIAGNOSIS — Z79899 Other long term (current) drug therapy: Secondary | ICD-10-CM | POA: Diagnosis not present

## 2024-03-28 DIAGNOSIS — Z794 Long term (current) use of insulin: Secondary | ICD-10-CM | POA: Diagnosis not present

## 2024-03-28 DIAGNOSIS — Z7982 Long term (current) use of aspirin: Secondary | ICD-10-CM | POA: Insufficient documentation

## 2024-03-28 DIAGNOSIS — R519 Headache, unspecified: Secondary | ICD-10-CM | POA: Insufficient documentation

## 2024-03-28 DIAGNOSIS — E162 Hypoglycemia, unspecified: Secondary | ICD-10-CM | POA: Diagnosis present

## 2024-03-28 LAB — CBC WITH DIFFERENTIAL/PLATELET
Abs Immature Granulocytes: 0.02 K/uL (ref 0.00–0.07)
Basophils Absolute: 0.1 K/uL (ref 0.0–0.1)
Basophils Relative: 1 %
Eosinophils Absolute: 0.1 K/uL (ref 0.0–0.5)
Eosinophils Relative: 3 %
HCT: 30.4 % — ABNORMAL LOW (ref 39.0–52.0)
Hemoglobin: 9.9 g/dL — ABNORMAL LOW (ref 13.0–17.0)
Immature Granulocytes: 0 %
Lymphocytes Relative: 24 %
Lymphs Abs: 1.2 K/uL (ref 0.7–4.0)
MCH: 29.1 pg (ref 26.0–34.0)
MCHC: 32.6 g/dL (ref 30.0–36.0)
MCV: 89.4 fL (ref 80.0–100.0)
Monocytes Absolute: 0.7 K/uL (ref 0.1–1.0)
Monocytes Relative: 14 %
Neutro Abs: 3 K/uL (ref 1.7–7.7)
Neutrophils Relative %: 58 %
Platelets: 314 K/uL (ref 150–400)
RBC: 3.4 MIL/uL — ABNORMAL LOW (ref 4.22–5.81)
RDW: 13.3 % (ref 11.5–15.5)
WBC: 5.2 K/uL (ref 4.0–10.5)
nRBC: 0 % (ref 0.0–0.2)

## 2024-03-28 NOTE — ED Notes (Signed)
 [phone] Amy, pt daughter called for a update

## 2024-03-28 NOTE — ED Triage Notes (Signed)
 7 units novolog at facility. 300CBG. Dropped to 77. Given 1 glucagon by EMS enroute. Dementia baseline. Alert and confused in triage. Bed alarm put in place.

## 2024-03-29 ENCOUNTER — Encounter: Payer: Self-pay | Admitting: Orthopedic Surgery

## 2024-03-29 ENCOUNTER — Emergency Department (HOSPITAL_BASED_OUTPATIENT_CLINIC_OR_DEPARTMENT_OTHER)

## 2024-03-29 ENCOUNTER — Non-Acute Institutional Stay (SKILLED_NURSING_FACILITY): Payer: Self-pay | Admitting: Orthopedic Surgery

## 2024-03-29 DIAGNOSIS — F02818 Dementia in other diseases classified elsewhere, unspecified severity, with other behavioral disturbance: Secondary | ICD-10-CM

## 2024-03-29 DIAGNOSIS — E113293 Type 2 diabetes mellitus with mild nonproliferative diabetic retinopathy without macular edema, bilateral: Secondary | ICD-10-CM

## 2024-03-29 DIAGNOSIS — R400 Somnolence: Secondary | ICD-10-CM | POA: Diagnosis not present

## 2024-03-29 DIAGNOSIS — G309 Alzheimer's disease, unspecified: Secondary | ICD-10-CM | POA: Diagnosis not present

## 2024-03-29 DIAGNOSIS — R710 Precipitous drop in hematocrit: Secondary | ICD-10-CM | POA: Diagnosis not present

## 2024-03-29 DIAGNOSIS — R634 Abnormal weight loss: Secondary | ICD-10-CM

## 2024-03-29 DIAGNOSIS — Z7189 Other specified counseling: Secondary | ICD-10-CM

## 2024-03-29 DIAGNOSIS — E162 Hypoglycemia, unspecified: Secondary | ICD-10-CM

## 2024-03-29 LAB — CBG MONITORING, ED
Glucose-Capillary: 112 mg/dL — ABNORMAL HIGH (ref 70–99)
Glucose-Capillary: 125 mg/dL — ABNORMAL HIGH (ref 70–99)
Glucose-Capillary: 104 mg/dL — ABNORMAL HIGH (ref 70–99)

## 2024-03-29 LAB — TROPONIN T, HIGH SENSITIVITY
Troponin T High Sensitivity: 92 ng/L — ABNORMAL HIGH (ref 0–19)
Troponin T High Sensitivity: 98 ng/L — ABNORMAL HIGH (ref 0–19)

## 2024-03-29 LAB — COMPREHENSIVE METABOLIC PANEL WITH GFR
ALT: 15 U/L (ref 0–44)
AST: 29 U/L (ref 15–41)
Albumin: 3.8 g/dL (ref 3.5–5.0)
Alkaline Phosphatase: 132 U/L — ABNORMAL HIGH (ref 38–126)
Anion gap: 13 (ref 5–15)
BUN: 29 mg/dL — ABNORMAL HIGH (ref 8–23)
CO2: 23 mmol/L (ref 22–32)
Calcium: 9.2 mg/dL (ref 8.9–10.3)
Chloride: 107 mmol/L (ref 98–111)
Creatinine, Ser: 1.44 mg/dL — ABNORMAL HIGH (ref 0.61–1.24)
GFR, Estimated: 47 mL/min — ABNORMAL LOW (ref >60)
Glucose, Bld: 120 mg/dL — ABNORMAL HIGH (ref 70–99)
Potassium: 4.2 mmol/L (ref 3.5–5.1)
Sodium: 143 mmol/L (ref 135–145)
Total Bilirubin: 0.3 mg/dL (ref 0.0–1.2)
Total Protein: 6.6 g/dL (ref 6.5–8.1)

## 2024-03-29 LAB — LIPASE, BLOOD: Lipase: 23 U/L (ref 11–51)

## 2024-03-29 NOTE — ED Provider Notes (Signed)
 [place] EMERGENCY DEPARTMENT AT [facility] Provider Note   CSN: [id number] Arrival date & time: 03/28/24  2253     Patient presents with: No chief complaint on file.   [name] is a 88 y.o. male.   Patient presents the ER from facility.  EMS states that patient's blood sugar was low at the facility.  Reportedly had minimal intake throughout the day today and then was given his insulin tonight before bed and then his blood sugar was low.  We do not know how low it was but they gave glucagon and EMS was called.  And route patient blood sugar 77 so was given another dose of glucagon.  He is at his reported baseline.  Attempted to discuss with daughter however did not answer her phone.  Patient will say yes or no but is hard to tell if it is accurate.  He states at this point he feels good.  He denies pain, nausea, vomiting by saying no.        Prior to Admission medications   Medication Sig Start Date End Date Taking? Authorizing Provider  amLODipine  (NORVASC ) 10 MG tablet TAKE 1 TABLET ONCE DAILY. 08/03/17   Verlin Lonni BIRCH, MD  aspirin  81 MG tablet Take 1 tablet (81 mg total) by mouth daily. 09/15/12   Verlin Lonni BIRCH, MD  atorvastatin (LIPITOR) 10 MG tablet Take 10 mg by mouth daily. 08/04/22   [provider]  citalopram (CELEXA) 20 MG tablet Take 20 mg by mouth daily. 02/03/12   [provider]  famotidine (PEPCID) 20 MG tablet Take 20 mg by mouth 2 (two) times daily. 02/11/20   [provider]  feeding supplement, GLUCERNA SHAKE, (GLUCERNA SHAKE) LIQD Take 237 mLs by mouth at bedtime.    [provider]  furosemide  (LASIX ) 20 MG tablet Take 1 tablet (20 mg total) by mouth 3 (three) times a week. 12/04/23   Wert, Christina, NP  furosemide  (LASIX ) 40 MG tablet Take 1 tablet (40 mg total) by mouth daily for 3 days. 03/03/24 03/06/24  Gil Greig BRAVO, NP  hydrALAZINE  (APRESOLINE ) 10 MG tablet Take 10 mg by mouth 3 (three)  times daily as needed (for SBP >175).    [provider]  hydrALAZINE  (APRESOLINE ) 25 MG tablet Take 50 mg by mouth 4 (four) times daily.    [provider]  hydrOXYzine  (VISTARIL ) 25 MG capsule Take 1 capsule (25 mg total) by mouth 2 (two) times daily as needed (increased agitation/ sundowning). 11/18/22   Fargo, Amy E, NP  hydrOXYzine  (VISTARIL ) 25 MG capsule Take 50 mg by mouth at bedtime.    [provider]  insulin aspart (NOVOLOG) 100 UNIT/ML injection Inject 7 Units into the skin 2 (two) times daily. With lunch and Dinner Only if CBGS more then 200    [provider]  Insulin Pen Needle (BD AUTOSHIELD DUO) 30G X 5 MM MISC 1 Needle by Does not apply route 3 (three) times daily.    [provider]  LANTUS SOLOSTAR 100 UNIT/ML injection Inject 20 Units into the skin daily. 12/10/11   [provider]  latanoprost (XALATAN) 0.005 % ophthalmic solution Place 1 drop into both eyes at bedtime.    [provider]  memantine  (NAMENDA ) 10 MG tablet TAKE ONE TABLET BY MOUTH EVERY MORNING and TAKE ONE TABLET BY MOUTH EVERYDAY AT BEDTIME 10/21/22   Wertman, Sara E, PA-C  metoCLOPramide  (REGLAN ) 5 MG tablet Take 5 mg by mouth as  directed. Orally before meals.    [provider]  mirtazapine  (REMERON  SOL-TAB) 30 MG disintegrating tablet Take 1 tablet (30 mg total) by mouth at bedtime. 02/05/24   Darlean Maus, NP  omeprazole  (PRILOSEC ) 40 MG capsule Take 40 mg by mouth daily before breakfast.    [provider]  ondansetron  (ZOFRAN ) 4 MG tablet Take 4 mg by mouth as directed. Give 30 min prior to meals.    [provider]  polyethylene glycol (MIRALAX ) 17 g packet Take 17 g by mouth daily. 09/03/23   Darlean Maus, NP  potassium chloride  (KLOR-CON  M) 10 MEQ tablet Take 1 tablet (10 mEq total) by mouth 3 (three) times a week. 12/04/23   Darlean Maus, NP  potassium chloride  SA (KLOR-CON  M) 20 MEQ tablet Take 1 tablet (20 mEq  total) by mouth daily for 3 days. 03/03/24 03/06/24  Fargo, Amy E, NP  Suvorexant  (BELSOMRA ) 20 MG TABS Take 1 tablet (20 mg total) by mouth at bedtime. 12/22/23   Fargo, Amy E, NP    Allergies: Ace inhibitors, Alpha lipoic acid [lipoic acid], Invokana [canagliflozin], Heparin , Isosorbide  mononitrate [isosorbide  dinitrate er], Penicillins, and Sulfonamide derivatives    Review of Systems  Updated Vital Signs BP (!) 173/101   Pulse (!) 59   Temp 98.5 F (36.9 C) (Oral)   Resp 16   SpO2 97%   Physical Exam Vitals and nursing note reviewed.  Constitutional:      Appearance: He is well-developed.  HENT:     Head: Normocephalic and atraumatic.  Cardiovascular:     Rate and Rhythm: Normal rate.  Pulmonary:     Effort: Pulmonary effort is normal. No respiratory distress.  Abdominal:     General: There is no distension.  Musculoskeletal:        General: Normal range of motion.     Cervical back: Normal range of motion.  Neurological:     Mental Status: He is alert. Mental status is at baseline.     Comments: Reportedly at baseline with severe dementia     (all labs ordered are listed, but only abnormal results are displayed) Labs Reviewed  CBC WITH DIFFERENTIAL/PLATELET - Abnormal; Notable for the following components:      Result Value   RBC 3.40 (*)    Hemoglobin 9.9 (*)    HCT 30.4 (*)    All other components within normal limits  COMPREHENSIVE METABOLIC PANEL WITH GFR - Abnormal; Notable for the following components:   Glucose, Bld 120 (*)    BUN 29 (*)    Creatinine, Ser 1.44 (*)    Alkaline Phosphatase 132 (*)    GFR, Estimated 47 (*)    All other components within normal limits  CBG MONITORING, ED - Abnormal; Notable for the following components:   Glucose-Capillary 125 (*)    All other components within normal limits  TROPONIN T, HIGH SENSITIVITY - Abnormal; Notable for the following components:   Troponin T High Sensitivity 92 (*)    All other components within  normal limits  TROPONIN T, HIGH SENSITIVITY - Abnormal; Notable for the following components:   Troponin T High Sensitivity 98 (*)    All other components within normal limits  LIPASE, BLOOD  URINALYSIS, ROUTINE W REFLEX MICROSCOPIC    EKG: EKG Interpretation Date/Time:  Monday March 28 2024 23:52:24 EDT Ventricular Rate:  60 PR Interval:    QRS Duration:  133 QT Interval:  462 QTC Calculation: 462 R Axis:   -47  Text Interpretation: Junctional rhythm Left bundle branch block Confirmed by Lorette Mayo [phone]) on 03/29/2024 1:21:31 AM  Radiology: CT Head Wo Contrast Result Date: 03/29/2024 EXAM: CT HEAD WITHOUT CONTRAST 03/29/2024 02:44:02 AM TECHNIQUE: CT of the head was performed without the administration of intravenous contrast. Automated exposure control, iterative reconstruction, and/or weight based adjustment of the mA/kV was utilized to reduce the radiation dose to as low as reasonably achievable. COMPARISON: 01/14/2023 CLINICAL HISTORY: Mental status change, unknown cause. 7 units novolog at facility. 300CBG. Dropped to 77. Given 1 glucagon by EMS enroute. Dementia baseline. Alert and confused in triage. Bed alarm put in place. FINDINGS: BRAIN AND VENTRICLES: No acute hemorrhage. No evidence of acute infarct. No hydrocephalus. No extra-axial collection. No mass effect or midline shift. Global cortical and central atrophy. Subcortical and periventricular small vessel ischemic changes. ORBITS: No acute abnormality. SINUSES: No acute abnormality. SOFT TISSUES AND SKULL: No acute soft tissue abnormality. No skull fracture. VASCULATURE: Intracranial atherosclerosis. LIMITATIONS/ARTIFACTS: Motion degraded images. IMPRESSION: 1. Motion degraded images. 2. No acute intracranial abnormality. 3. Atrophy with small vessel ischemic changes. Electronically signed by: Pinkie Pebbles MD 03/29/2024 02:46 AM EDT RP Workstation: [id number]     Procedures   Medications Ordered in the ED - No  data to display                                  Medical Decision Making Amount and/or Complexity of Data Reviewed Labs: ordered. Radiology: ordered. ECG/medicine tests: ordered.   Reportedly at baseline.  Cannot corroborate with family.  Patient was observed for about 6 hours with no recurrent episodes of hypoglycemia.  Has been labs are overall reassuring.  Head CT done was negative.  His kidney function is elevated but at his baseline.  His troponin slightly elevated as well but flat and EKG negative so I doubt this is ACS and this is probably mostly related to the hypoglycemia and chronic kidney disease decreasing clearance.  Stable for discharge back to the facility with close PCP follow-up.     Final diagnoses:  Hypoglycemia    ED Discharge Orders     None          Sabin Gibeault, Mayo, MD 03/29/24 [phone]

## 2024-03-29 NOTE — Progress Notes (Addendum)
 Location:  [facility] Room Number: 309/A Place of Service:  SNF [phone]) Provider:  Greig FORBES Cluster, NP   Charlanne Fredia CROME, MD  Patient Care Team: Charlanne Fredia CROME, MD as PCP - General (Internal Medicine) Verlin Lonni BIRCH, MD as PCP - Cardiology (Cardiology) Skeet Juliene SAUNDERS, DO as Consulting Physician (Neurology)  Extended Emergency Contact Information Primary Emergency Contact: [facility] Address: [address]          [place], [place] [address] [place] Home Phone: [phone] Mobile Phone: [phone] Relation: Daughter Secondary Emergency Contact: [facility] Phone: [phone] Relation: Spouse Preferred language: English Interpreter needed? No  Code Status:  DNR Goals of care: Advanced Directive information    03/28/2024   11:16 PM  Advanced Directives  Does Patient Have a Medical Advance Directive? Yes  Type of Advance Directive Out of facility DNR (pink MOST or yellow form)  Does patient want to make changes to medical advance directive? Yes (ED - Information included in AVS)     Chief Complaint  Patient presents with   Hospitalization Follow-up    ED follow up    HPI:  Pt is a 88 y.o. male seen today for f/u s/p ED visit 03/28/2024 due to hypoglycemia.   He currently resides on the memory care unit at [facility]. PMH: CAD, HTN, MI, OSA, pulmonary nodule, T2DM with retinopathy/neuropathy, CKD, dementia, depression and unstable gait.   09/22 he was sent to ED due to hypoglycemia and somnolence. He was noted to have poor po intake in AM and Lantus was held. Later in the day his blood sugar was 300 and he was more alert. He received Lantus. Rechecked blood sugar was 88. He was given pudding and orange juice but he spit food/fluids out. Rechecked blood sugar was 79. Nursing did administer glucagon. His alertness continued to decline and he was sent to ED for evaluation. Initial ED glucose 120. CT head  negative for acute intracranial abnormality. Troponin was slightly elevated but did not grow. EKG noted junctional rhythm with LBBB. Hypoglycemia thought to be related to CKD and decreasing clearance. Blood sugar was 125 at discharge.   Today, he continues to be tired. He refused breakfast this morning. SBP in 180's. He does not follow any commands or talk. 08/28 he was started on doxycycline  x 5 days due to aspiration pneumonia. 09/08 diet was downgraded by ST. He continues to spit out foods and fluids. Weight has continued to drop within past few months. He is taking Remeron . Glucerna was also started. Yesterday, WBC was 5.2, hgb was 9.9> was 12.1 6 months ago. BUN/creat 29/1.44, GFR 47. He is taking belsomra  and vistaril  for insomnia related to dementia.   Recent A1c 7.4 02/08/2024. Remains on Lantus 20 units BID and novolog 7 units BID.   He is taking omeprazole  and famotidine for GERD.      Recent weights:  09/01- 173.6 lbs              08/07- 179 lbs             07/01- 181.4 lbs             06/01- 185 lbs   Past Medical History:  Diagnosis Date   CAD (coronary artery disease)    Bare meta stent RCA   Carotid stenosis    a. Carotid US  3/17: bilateral ICA 1-39% >> repeat 2 years   Coronary atherosclerosis of native coronary artery 08/24/2008   S/P PCI  Dementia 12/15/2021   concerns for Alzheimer's disease   Essential hypertension, benign 08/24/2008   Fatigue 08/08/2010   History of nuclear stress test    Myoview  12/16: EF 66%, very mild inferior ischemia; Low Risk   Mixed hyperlipidemia 08/24/2008   Myocardial infarction 10/06/2007   Nonproliferative diabetic retinopathy 08/08/2011   Nuclear sclerosis of both eyes 12/01/2017   Peripheral neuropathy    Pulmonary nodule, right 09/13/2007   Right upper lobe         Sinoatrial node dysfunction 08/24/2008   Chronotropic incompetence   Snoring 09/27/2009   Type II diabetes mellitus 08/08/2011   Past Surgical History:   Procedure Laterality Date   LEFT HEART CATHETERIZATION WITH CORONARY ANGIOGRAM N/A 03/04/2012   Procedure: LEFT HEART CATHETERIZATION WITH CORONARY ANGIOGRAM;  Surgeon: Lonni JONETTA Cash, MD;  Location: [facility] CATH LAB;  Service: Cardiovascular;  Laterality: N/A;   TONSILLECTOMY      Allergies  Allergen Reactions   Ace Inhibitors    Alpha Lipoic Acid [Lipoic Acid]     Upset Stomach   Invokana [Canagliflozin]    Heparin  Hives   Isosorbide  Mononitrate [Isosorbide  Dinitrate Er] Hives   Penicillins Hives   Sulfonamide Derivatives Hives    Outpatient Encounter Medications as of 03/29/2024  Medication Sig   amLODipine  (NORVASC ) 10 MG tablet TAKE 1 TABLET ONCE DAILY.   aspirin  81 MG tablet Take 1 tablet (81 mg total) by mouth daily.   atorvastatin (LIPITOR) 10 MG tablet Take 10 mg by mouth daily.   citalopram (CELEXA) 20 MG tablet Take 20 mg by mouth daily.   famotidine (PEPCID) 20 MG tablet Take 20 mg by mouth 2 (two) times daily.   feeding supplement, GLUCERNA SHAKE, (GLUCERNA SHAKE) LIQD Take 237 mLs by mouth at bedtime.   furosemide  (LASIX ) 20 MG tablet Take 1 tablet (20 mg total) by mouth 3 (three) times a week.   furosemide  (LASIX ) 40 MG tablet Take 1 tablet (40 mg total) by mouth daily for 3 days.   hydrALAZINE  (APRESOLINE ) 10 MG tablet Take 10 mg by mouth 3 (three) times daily as needed (for SBP >175).   hydrALAZINE  (APRESOLINE ) 25 MG tablet Take 50 mg by mouth 4 (four) times daily.   hydrOXYzine  (VISTARIL ) 25 MG capsule Take 1 capsule (25 mg total) by mouth 2 (two) times daily as needed (increased agitation/ sundowning).   hydrOXYzine  (VISTARIL ) 25 MG capsule Take 50 mg by mouth at bedtime.   insulin aspart (NOVOLOG) 100 UNIT/ML injection Inject 7 Units into the skin 2 (two) times daily. With lunch and Dinner Only if CBGS more then 200   Insulin Pen Needle (BD AUTOSHIELD DUO) 30G X 5 MM MISC 1 Needle by Does not apply route 3 (three) times daily.   LANTUS SOLOSTAR 100 UNIT/ML  injection Inject 20 Units into the skin daily.   latanoprost (XALATAN) 0.005 % ophthalmic solution Place 1 drop into both eyes at bedtime.   memantine  (NAMENDA ) 10 MG tablet TAKE ONE TABLET BY MOUTH EVERY MORNING and TAKE ONE TABLET BY MOUTH EVERYDAY AT BEDTIME   metoCLOPramide  (REGLAN ) 5 MG tablet Take 5 mg by mouth as directed. Orally before meals.   mirtazapine  (REMERON  SOL-TAB) 30 MG disintegrating tablet Take 1 tablet (30 mg total) by mouth at bedtime.   omeprazole  (PRILOSEC ) 40 MG capsule Take 40 mg by mouth daily before breakfast.   ondansetron  (ZOFRAN ) 4 MG tablet Take 4 mg by mouth as directed. Give 30 min prior to meals.   polyethylene glycol (MIRALAX )  17 g packet Take 17 g by mouth daily.   potassium chloride  (KLOR-CON  M) 10 MEQ tablet Take 1 tablet (10 mEq total) by mouth 3 (three) times a week.   potassium chloride  SA (KLOR-CON  M) 20 MEQ tablet Take 1 tablet (20 mEq total) by mouth daily for 3 days.   Suvorexant  (BELSOMRA ) 20 MG TABS Take 1 tablet (20 mg total) by mouth at bedtime.   No facility-administered encounter medications on file as of 03/29/2024.    Review of Systems  Unable to perform ROS: Dementia    Immunization History  Administered Date(s) Administered   Fluad  Quad(high Dose 65+) 04/08/2022, 04/28/2023   Fluzone Influenza virus vaccine,trivalent (IIV3), split virus 03/11/2011, 05/03/2021   INFLUENZA, HIGH DOSE SEASONAL PF 03/29/2014, 04/02/2015, 03/31/2016, 04/08/2017, 04/16/2018   Influenza Split 05/01/2010, 04/06/2012, 04/21/2013   Influenza Whole 03/07/2009   Influenza-Unspecified 04/16/2019, 03/29/2020   Moderna Covid-19 Fall Seasonal Vaccine 37yrs & older 05/07/2022   Moderna Covid-19 Vaccine Bivalent Booster 40yrs & up 04/28/2023   Moderna Sars-Covid-2 Vaccination 07/19/2019, 08/17/2019, 06/01/2020, 12/09/2021   Pneumococcal Conjugate-13 03/29/2014   Pneumococcal Polysaccharide-23 10/31/2003, 09/07/2006, 03/25/2013   Td (Adult) 10/30/1993   Tdap  07/07/2010, 03/19/2011, 04/16/2023   Zoster Recombinant(Shingrix) 10/24/2016, 12/24/2016   Zoster, Live 10/30/2005, 10/21/2016, 12/24/2016   Zoster, Unspecified 10/24/2016   Pertinent  Health Maintenance Due  Topic Date Due   OPHTHALMOLOGY EXAM  07/30/2023   Influenza Vaccine  02/05/2024   FOOT EXAM  07/08/2024   HEMOGLOBIN A1C  08/10/2024      04/09/2023    4:03 PM 04/20/2023    4:07 PM 05/22/2023   10:57 AM 07/20/2023    1:17 PM 03/03/2024   10:24 AM  Fall Risk  Falls in the past year? 1 0 0 0 0  Was there an injury with Fall? 1 0 0 0 0  Fall Risk Category Calculator 2 0 0 0 0  Patient at Risk for Falls Due to History of fall(s) No Fall Risks No Fall Risks No Fall Risks History of fall(s);Impaired mobility  Fall risk Follow up Falls evaluation completed Falls evaluation completed Falls evaluation completed Falls evaluation completed Falls evaluation completed   Functional Status Survey:    Vitals:   03/29/24 0954  BP: (!) 173/75  Pulse: 61  Resp: 16  Temp: (!) 97 F (36.1 C)  SpO2: 96%  Weight: 173 lb 9.6 oz (78.7 kg)  Height: 6' (1.829 m)   Body mass index is 23.54 kg/m. Physical Exam Vitals reviewed.  HENT:     Head: Normocephalic.  Eyes:     General:        Right eye: No discharge.        Left eye: No discharge.  Cardiovascular:     Rate and Rhythm: Normal rate and regular rhythm.     Pulses: Normal pulses.     Heart sounds: Normal heart sounds.  Pulmonary:     Effort: Pulmonary effort is normal. No respiratory distress.     Breath sounds: Examination of the right-middle field reveals decreased breath sounds. Examination of the left-middle field reveals decreased breath sounds. Examination of the right-lower field reveals decreased breath sounds. Examination of the left-lower field reveals decreased breath sounds. Decreased breath sounds present. No wheezing or rales.  Abdominal:     General: Bowel sounds are normal. There is no distension.     Palpations:  Abdomen is soft.     Tenderness: There is no abdominal tenderness.  Musculoskeletal:  Cervical back: Neck supple.     Right lower leg: No edema.     Left lower leg: No edema.  Skin:    General: Skin is warm.     Capillary Refill: Capillary refill takes less than 2 seconds.  Neurological:     General: No focal deficit present.     Mental Status: He is easily aroused. Mental status is at baseline.     Motor: Weakness present.     Gait: Gait abnormal.  Psychiatric:     Comments: Nonverbal, does not follow commands     Labs reviewed: Recent Labs    10/20/23 0000 02/08/24 0000 03/28/24 2335  NA 139 142 143  K 4.8 4.3 4.2  CL 104 105 107  CO2 22 23* 23  GLUCOSE  --   --  120*  BUN 18 29* 29*  CREATININE 1.5* 1.7* 1.44*  CALCIUM 8.6* 8.9 9.2   Recent Labs    09/21/23 0000 03/28/24 2335  AST 32 29  ALT 29 15  ALKPHOS 125 132*  BILITOT  --  0.3  PROT  --  6.6  ALBUMIN 3.7 3.8   Recent Labs    09/21/23 0000 03/28/24 2335  WBC 5.2 5.2  NEUTROABS  --  3.0  HGB 12.1* 9.9*  HCT 36* 30.4*  MCV  --  89.4  PLT 252 314   Lab Results  Component Value Date   TSH 2.58 08/19/2022   Lab Results  Component Value Date   HGBA1C 7.4 02/08/2024   Lab Results  Component Value Date   CHOL 136 08/19/2022   HDL 37 08/19/2022   LDLCALC 80 08/19/2022   TRIG 96 08/19/2022   CHOLHDL 3.0 09/04/2007    Significant Diagnostic Results in last 30 days:  CT Head Wo Contrast Result Date: 03/29/2024 EXAM: CT HEAD WITHOUT CONTRAST 03/29/2024 02:44:02 AM TECHNIQUE: CT of the head was performed without the administration of intravenous contrast. Automated exposure control, iterative reconstruction, and/or weight based adjustment of the mA/kV was utilized to reduce the radiation dose to as low as reasonably achievable. COMPARISON: 01/14/2023 CLINICAL HISTORY: Mental status change, unknown cause. 7 units novolog at facility. 300CBG. Dropped to 77. Given 1 glucagon by EMS enroute.  Dementia baseline. Alert and confused in triage. Bed alarm put in place. FINDINGS: BRAIN AND VENTRICLES: No acute hemorrhage. No evidence of acute infarct. No hydrocephalus. No extra-axial collection. No mass effect or midline shift. Global cortical and central atrophy. Subcortical and periventricular small vessel ischemic changes. ORBITS: No acute abnormality. SINUSES: No acute abnormality. SOFT TISSUES AND SKULL: No acute soft tissue abnormality. No skull fracture. VASCULATURE: Intracranial atherosclerosis. LIMITATIONS/ARTIFACTS: Motion degraded images. IMPRESSION: 1. Motion degraded images. 2. No acute intracranial abnormality. 3. Atrophy with small vessel ischemic changes. Electronically signed by: Pinkie Pebbles MD 03/29/2024 02:46 AM EDT RP Workstation: [id number]    Assessment/Plan 1. Hypoglycemia (Primary) - associated with behaviors of spitting food/drink  - recent weight loss  - 09/22 ED evaluation> thought to be related to CKD and declining clearance - recent A1c 7.4 - discontinue Lantus - start novolog SSI - cont Glucerna  2. Somnolence - ongoing  - WBC unremarkable - hgb dropped 9.9 (09/22)> was 10.2 (08/28)> was 12.1 (03/17) - 08/28 CXR noted aspiration PNA - lung sounds diminished today  - hemoccult x 2  - consider repeat CXR in future  - discontinue vistaril  bedtime - consider stopping/reducing Belsomra    3. Decreased hemoglobin - see above - ? Acute blood loss versus  anemia chronic disease - cont omeprazole  and famotidine  4. Alzheimer's dementia with behavioral disturbance (HCC) - no behaviors - increased somnolence - spitting food/fluids - weight loss  - cont Namenda   5. Weight loss - ongoing  - TSH 2.58 08/20/2023 - followed by dietary - cont Glucerna   6. Advanced directives, counseling/discussion - if further decline or weight loss recommend Hospice    Family/ staff Communication: plan discussed with patient and nurse  Labs/tests ordered:   hemoccult x 2, CBG AC and HS> give report to PCP in 1 week

## 2024-03-29 NOTE — ED Notes (Addendum)
 CBG- 112

## 2024-03-30 NOTE — Addendum Note (Signed)
 Addended by: GIL NO E on: 03/30/2024 09:37 AM   Modules accepted: Orders

## 2024-04-01 ENCOUNTER — Encounter: Payer: Self-pay | Admitting: Adult Health

## 2024-04-04 ENCOUNTER — Encounter: Payer: Self-pay | Admitting: Internal Medicine

## 2024-04-04 ENCOUNTER — Non-Acute Institutional Stay (SKILLED_NURSING_FACILITY): Payer: Self-pay | Admitting: Internal Medicine

## 2024-04-04 DIAGNOSIS — N1832 Chronic kidney disease, stage 3b: Secondary | ICD-10-CM

## 2024-04-04 DIAGNOSIS — D649 Anemia, unspecified: Secondary | ICD-10-CM

## 2024-04-04 DIAGNOSIS — G309 Alzheimer's disease, unspecified: Secondary | ICD-10-CM

## 2024-04-04 DIAGNOSIS — G4700 Insomnia, unspecified: Secondary | ICD-10-CM | POA: Diagnosis not present

## 2024-04-04 DIAGNOSIS — E113293 Type 2 diabetes mellitus with mild nonproliferative diabetic retinopathy without macular edema, bilateral: Secondary | ICD-10-CM

## 2024-04-04 DIAGNOSIS — E785 Hyperlipidemia, unspecified: Secondary | ICD-10-CM

## 2024-04-04 DIAGNOSIS — Z7189 Other specified counseling: Secondary | ICD-10-CM

## 2024-04-04 DIAGNOSIS — R1111 Vomiting without nausea: Secondary | ICD-10-CM

## 2024-04-04 DIAGNOSIS — I10 Essential (primary) hypertension: Secondary | ICD-10-CM | POA: Diagnosis not present

## 2024-04-04 DIAGNOSIS — F02818 Dementia in other diseases classified elsewhere, unspecified severity, with other behavioral disturbance: Secondary | ICD-10-CM

## 2024-04-04 DIAGNOSIS — Z794 Long term (current) use of insulin: Secondary | ICD-10-CM

## 2024-04-04 NOTE — Progress Notes (Signed)
 This encounter was created in error - please disregard.

## 2024-04-04 NOTE — Progress Notes (Unsigned)
 Location:  [facility] Room Number: 309-P Place of Service:  SNF [phone]) Provider:  Charlanne Fredia CROME, MD  Patient Care Team: Charlanne Fredia CROME, MD as PCP - General (Internal Medicine) Verlin Lonni BIRCH, MD as PCP - Cardiology (Cardiology) Skeet Juliene SAUNDERS, DO as Consulting Physician (Neurology)  Extended Emergency Contact Information Primary Emergency Contact: [facility] Address: [address]          [place], [place] [address] [place] Home Phone: [phone] Mobile Phone: [phone] Relation: Daughter Secondary Emergency Contact: [facility] Phone: [phone] Relation: Spouse Preferred language: English Interpreter needed? No  Code Status:  DNR Goals of care: Advanced Directive information    03/28/2024   11:16 PM  Advanced Directives  Does Patient Have a Medical Advance Directive? Yes  Type of Advance Directive Out of facility DNR (pink MOST or yellow form)  Does patient want to make changes to medical advance directive? Yes (ED - Information included in AVS)     Chief Complaint  Patient presents with  . Medical Management of Chronic Issues    Routine Visit, needs to discuss eye exam, medicare annual wellness visit, flu and covid vaccine.    HPI:  Pt is a 87 y.o. male seen today for medical management of chronic diseases.     Past Medical History:  Diagnosis Date  . CAD (coronary artery disease)    Bare meta stent RCA  . Carotid stenosis    a. Carotid US  3/17: bilateral ICA 1-39% >> repeat 2 years  . Coronary atherosclerosis of native coronary artery 08/24/2008   S/P PCI  . Dementia 12/15/2021   concerns for Alzheimer's disease  . Essential hypertension, benign 08/24/2008  . Fatigue 08/08/2010  . History of nuclear stress test    Myoview  12/16: EF 66%, very mild inferior ischemia; Low Risk  . Mixed hyperlipidemia 08/24/2008  . Myocardial infarction 10/06/2007  . Nonproliferative diabetic  retinopathy 08/08/2011  . Nuclear sclerosis of both eyes 12/01/2017  . Peripheral neuropathy   . Pulmonary nodule, right 09/13/2007   Right upper lobe        . Sinoatrial node dysfunction 08/24/2008   Chronotropic incompetence  . Snoring 09/27/2009  . Type II diabetes mellitus 08/08/2011   Past Surgical History:  Procedure Laterality Date  . LEFT HEART CATHETERIZATION WITH CORONARY ANGIOGRAM N/A 03/04/2012   Procedure: LEFT HEART CATHETERIZATION WITH CORONARY ANGIOGRAM;  Surgeon: Lonni BIRCH Verlin, MD;  Location: [facility] CATH LAB;  Service: Cardiovascular;  Laterality: N/A;  . TONSILLECTOMY      Allergies  Allergen Reactions  . Ace Inhibitors   . Alpha Lipoic Acid [Lipoic Acid]     Upset Stomach  . Invokana [Canagliflozin]   . Heparin  Hives  . Isosorbide  Mononitrate [Isosorbide  Dinitrate Er] Hives  . Penicillins Hives  . Sulfonamide Derivatives Hives    Outpatient Encounter Medications as of 04/04/2024  Medication Sig  . amLODipine  (NORVASC ) 10 MG tablet TAKE 1 TABLET ONCE DAILY.  . aspirin  81 MG tablet Take 1 tablet (81 mg total) by mouth daily.  SABRA atorvastatin (LIPITOR) 10 MG tablet Take 10 mg by mouth daily.  . citalopram (CELEXA) 20 MG tablet Take 20 mg by mouth daily.  . famotidine (PEPCID) 20 MG tablet Take 20 mg by mouth 2 (two) times daily.  . feeding supplement, GLUCERNA SHAKE, (GLUCERNA SHAKE) LIQD Take 237 mLs by mouth at bedtime.  . furosemide  (LASIX ) 20 MG tablet Take 1 tablet (20 mg total) by mouth 3 (three) times  a week.  . hydrALAZINE  (APRESOLINE ) 10 MG tablet Take 10 mg by mouth 3 (three) times daily as needed (for SBP >175).  . hydrALAZINE  (APRESOLINE ) 25 MG tablet Take 50 mg by mouth 4 (four) times daily.  . hydrOXYzine  (VISTARIL ) 25 MG capsule Take 1 capsule (25 mg total) by mouth 2 (two) times daily as needed (increased agitation/ sundowning).  . hydrOXYzine  (VISTARIL ) 25 MG capsule Take 50 mg by mouth at bedtime.  . insulin aspart (NOVOLOG) 100 UNIT/ML  injection Inject 2 Units into the skin 4 (four) times daily -  before meals and at bedtime. 2 units > 151-200 4 units > 201-250 6 units > 251-300 8 units> 301-350 10 units> 351-400  . Insulin Pen Needle (BD AUTOSHIELD DUO) 30G X 5 MM MISC 1 Needle by Does not apply route 3 (three) times daily.  SABRA latanoprost (XALATAN) 0.005 % ophthalmic solution Place 1 drop into both eyes at bedtime.  . memantine  (NAMENDA ) 10 MG tablet TAKE ONE TABLET BY MOUTH EVERY MORNING and TAKE ONE TABLET BY MOUTH EVERYDAY AT BEDTIME  . metoCLOPramide  (REGLAN ) 5 MG tablet Take 5 mg by mouth as directed. Orally before meals.  . mirtazapine  (REMERON  SOL-TAB) 30 MG disintegrating tablet Take 1 tablet (30 mg total) by mouth at bedtime.  . omeprazole  (PRILOSEC ) 40 MG capsule Take 40 mg by mouth daily before breakfast.  . ondansetron  (ZOFRAN ) 4 MG tablet Take 4 mg by mouth as directed. Give 30 min prior to meals.  . polyethylene glycol (MIRALAX ) 17 g packet Take 17 g by mouth daily.  . potassium chloride  (KLOR-CON  M) 10 MEQ tablet Take 1 tablet (10 mEq total) by mouth 3 (three) times a week.  . Suvorexant  (BELSOMRA ) 20 MG TABS Take 1 tablet (20 mg total) by mouth at bedtime.  . potassium chloride  SA (KLOR-CON  M) 20 MEQ tablet Take 1 tablet (20 mEq total) by mouth daily for 3 days. (Patient not taking: Reported on 04/04/2024)   No facility-administered encounter medications on file as of 04/04/2024.    Review of Systems  Immunization History  Administered Date(s) Administered  . Fluad  Quad(high Dose 65+) 04/08/2022, 04/28/2023  . Fluzone Influenza virus vaccine,trivalent (IIV3), split virus 03/11/2011, 05/03/2021  . INFLUENZA, HIGH DOSE SEASONAL PF 03/29/2014, 04/02/2015, 03/31/2016, 04/08/2017, 04/16/2018  . Influenza Split 05/01/2010, 04/06/2012, 04/21/2013  . Influenza Whole 03/07/2009  . Influenza-Unspecified 04/16/2019, 03/29/2020  . Moderna Covid-19 Fall Seasonal Vaccine 3yrs & older 05/07/2022  . Moderna Covid-19  Vaccine Bivalent Booster 54yrs & up 04/28/2023  . Moderna Sars-Covid-2 Vaccination 07/19/2019, 08/17/2019, 06/01/2020, 12/09/2021  . Pneumococcal Conjugate-13 03/29/2014  . Pneumococcal Polysaccharide-23 10/31/2003, 09/07/2006, 03/25/2013  . Td (Adult) 10/30/1993  . Tdap 07/07/2010, 03/19/2011, 04/16/2023  . Zoster Recombinant(Shingrix) 10/24/2016, 12/24/2016  . Zoster, Live 10/30/2005, 10/21/2016, 12/24/2016  . Zoster, Unspecified 10/24/2016   Pertinent  Health Maintenance Due  Topic Date Due  . OPHTHALMOLOGY EXAM  07/30/2023  . Influenza Vaccine  02/05/2024  . FOOT EXAM  07/08/2024  . HEMOGLOBIN A1C  08/10/2024      04/20/2023    4:07 PM 05/22/2023   10:57 AM 07/20/2023    1:17 PM 03/03/2024   10:24 AM 03/29/2024    1:22 PM  Fall Risk  Falls in the past year? 0 0 0 0 0  Was there an injury with Fall? 0 0 0 0 0  Fall Risk Category Calculator 0 0 0 0 0  Patient at Risk for Falls Due to No Fall Risks No Fall Risks No  Fall Risks History of fall(s);Impaired mobility History of fall(s);Impaired mobility  Fall risk Follow up Falls evaluation completed Falls evaluation completed Falls evaluation completed Falls evaluation completed Falls evaluation completed   Functional Status Survey:    Vitals:   04/04/24 1141  BP: (!) 182/66  Pulse: 64  Resp: 16  Temp: (!) 97 F (36.1 C)  SpO2: 95%  Weight: 173 lb 9.6 oz (78.7 kg)  Height: 6' (1.829 m)   Body mass index is 23.54 kg/m. Physical Exam  Labs reviewed: Recent Labs    02/08/24 0000 03/07/24 0000 03/28/24 2335  NA 142 144 143  K 4.3 4.4 4.2  CL 105 107 107  CO2 23* 25* 23  GLUCOSE  --   --  120*  BUN 29* 33* 29*  CREATININE 1.7* 1.6* 1.44*  CALCIUM 8.9 8.9 9.2   Recent Labs    09/21/23 0000 03/28/24 2335  AST 32 29  ALT 29 15  ALKPHOS 125 132*  BILITOT  --  0.3  PROT  --  6.6  ALBUMIN 3.7 3.8   Recent Labs    09/21/23 0000 03/28/24 2335  WBC 5.2 5.2  NEUTROABS  --  3.0  HGB 12.1* 9.9*  HCT 36*  30.4*  MCV  --  89.4  PLT 252 314   Lab Results  Component Value Date   TSH 2.58 08/19/2022   Lab Results  Component Value Date   HGBA1C 7.4 02/08/2024   Lab Results  Component Value Date   CHOL 136 08/19/2022   HDL 37 08/19/2022   LDLCALC 80 08/19/2022   TRIG 96 08/19/2022   CHOLHDL 3.0 09/04/2007    Significant Diagnostic Results in last 30 days:  CT Head Wo Contrast Result Date: 03/29/2024 EXAM: CT HEAD WITHOUT CONTRAST 03/29/2024 02:44:02 AM TECHNIQUE: CT of the head was performed without the administration of intravenous contrast. Automated exposure control, iterative reconstruction, and/or weight based adjustment of the mA/kV was utilized to reduce the radiation dose to as low as reasonably achievable. COMPARISON: 01/14/2023 CLINICAL HISTORY: Mental status change, unknown cause. 7 units novolog at facility. 300CBG. Dropped to 77. Given 1 glucagon by EMS enroute. Dementia baseline. Alert and confused in triage. Bed alarm put in place. FINDINGS: BRAIN AND VENTRICLES: No acute hemorrhage. No evidence of acute infarct. No hydrocephalus. No extra-axial collection. No mass effect or midline shift. Global cortical and central atrophy. Subcortical and periventricular small vessel ischemic changes. ORBITS: No acute abnormality. SINUSES: No acute abnormality. SOFT TISSUES AND SKULL: No acute soft tissue abnormality. No skull fracture. VASCULATURE: Intracranial atherosclerosis. LIMITATIONS/ARTIFACTS: Motion degraded images. IMPRESSION: 1. Motion degraded images. 2. No acute intracranial abnormality. 3. Atrophy with small vessel ischemic changes. Electronically signed by: Pinkie Pebbles MD 03/29/2024 02:46 AM EDT RP Workstation: [id number]    Assessment/Plan There are no diagnoses linked to this encounter.   Family/ staff Communication: ***  Labs/tests ordered:  ***

## 2024-04-05 DIAGNOSIS — G309 Alzheimer's disease, unspecified: Secondary | ICD-10-CM | POA: Insufficient documentation

## 2024-04-05 DIAGNOSIS — G4700 Insomnia, unspecified: Secondary | ICD-10-CM | POA: Insufficient documentation

## 2024-04-05 DIAGNOSIS — D649 Anemia, unspecified: Secondary | ICD-10-CM | POA: Insufficient documentation

## 2024-04-05 MED ORDER — INSULIN GLARGINE 100 UNIT/ML ~~LOC~~ SOLN: 5.0000 [IU] | Freq: Every morning | SUBCUTANEOUS | Status: AC

## 2024-04-19 ENCOUNTER — Other Ambulatory Visit: Payer: Self-pay | Admitting: Internal Medicine

## 2024-04-19 DIAGNOSIS — G309 Alzheimer's disease, unspecified: Secondary | ICD-10-CM

## 2024-04-19 DIAGNOSIS — G4700 Insomnia, unspecified: Secondary | ICD-10-CM

## 2024-04-19 MED ORDER — BELSOMRA 20 MG PO TABS: 1.0000 | ORAL_TABLET | Freq: Every day | ORAL | 3 refills | Status: AC

## 2024-05-20 ENCOUNTER — Encounter: Payer: Self-pay | Admitting: Adult Health

## 2024-05-20 ENCOUNTER — Non-Acute Institutional Stay (SKILLED_NURSING_FACILITY): Payer: Self-pay | Admitting: Adult Health

## 2024-05-20 DIAGNOSIS — F5101 Primary insomnia: Secondary | ICD-10-CM | POA: Diagnosis not present

## 2024-05-20 DIAGNOSIS — N1831 Chronic kidney disease, stage 3a: Secondary | ICD-10-CM | POA: Diagnosis not present

## 2024-05-20 DIAGNOSIS — Z794 Long term (current) use of insulin: Secondary | ICD-10-CM

## 2024-05-20 DIAGNOSIS — F0283 Dementia in other diseases classified elsewhere, unspecified severity, with mood disturbance: Secondary | ICD-10-CM | POA: Diagnosis not present

## 2024-05-20 DIAGNOSIS — E1142 Type 2 diabetes mellitus with diabetic polyneuropathy: Secondary | ICD-10-CM

## 2024-05-20 DIAGNOSIS — E118 Type 2 diabetes mellitus with unspecified complications: Secondary | ICD-10-CM

## 2024-05-20 DIAGNOSIS — I2510 Atherosclerotic heart disease of native coronary artery without angina pectoris: Secondary | ICD-10-CM

## 2024-05-20 DIAGNOSIS — G301 Alzheimer's disease with late onset: Secondary | ICD-10-CM | POA: Diagnosis not present

## 2024-05-20 DIAGNOSIS — I10 Essential (primary) hypertension: Secondary | ICD-10-CM

## 2024-05-20 DIAGNOSIS — E785 Hyperlipidemia, unspecified: Secondary | ICD-10-CM | POA: Diagnosis not present

## 2024-05-20 DIAGNOSIS — R1111 Vomiting without nausea: Secondary | ICD-10-CM | POA: Diagnosis not present

## 2024-05-20 NOTE — Progress Notes (Signed)
 Location:  [facility] Room Number: 309 P Place of Service:  SNF [phone]) Provider:  Tawni America, NP   Patient Care Team: Charlanne Fredia CROME, MD as PCP - General (Internal Medicine) Verlin Lonni BIRCH, MD as PCP - Cardiology (Cardiology) Skeet Juliene SAUNDERS, DO as Consulting Physician (Neurology)  Extended Emergency Contact Information Primary Emergency Contact: [facility] Address: [address]          [place], [place] [address] [place] Home Phone: [phone] Mobile Phone: [phone] Relation: Daughter Secondary Emergency Contact: [facility] Phone: [phone] Relation: Spouse Preferred language: English Interpreter needed? No  Code Status:  DNR Goals of care: Advanced Directive information    03/28/2024   11:16 PM  Advanced Directives  Does Patient Have a Medical Advance Directive? Yes  Type of Advance Directive Out of facility DNR (pink MOST or yellow form)  Does patient want to make changes to medical advance directive? Yes (ED - Information included in AVS)     Chief Complaint  Patient presents with   Routine Visit    HPI:  Pt is a 88 y.o. male seen today for medical management of chronic diseases.    PMH significant for DM II, dementia, AD, carotid stenosis, HTN, vomiting with gastroparesis, GERD, HLD CAD with PCI  DM II: CBG 127-324 A1C 7.4. Current on meal coverage and lantus dose had been adjusted at last visit due to low and high numbers. Did go to the Ed for hypoglycemia in September.   Eye exam August of 2024  Dementia: MMSE 15/30  Now requires a hoyer lift for tranfers Behaviors have improved per nurse but still has some periods of resistance to personal care.   On Remeron  and belsomra  for sleep. No reported issues with sleep  Off namenda   Vomiting: no current issues with PPI, pepcid, Zofran   and Reglan . Possible gastroparesis vs med s/e  Abd ultrasound 08/2023 revealed a questionable  finding in the common bile duct, but no gallstones or sludge were detected. The gallbladder visualization was limited by gas, and no significant abnormalities were noted in the liver, right kidney, or pancreas.  KUBs have been negative.  CKD BUN 25 Cr 1.6 05/21/24  CAD with hx of PCI on asa, off statin due to hospice status   BPs are significantly elevated in review of records at [facility].  BPs checked up to 5-6x a day but he is on hospice He is asymptomatic with no sob, cp, edema Weight unchanged the past few times, he did have progressive weight loss earlier this year.  Wt Readings from Last 3 Encounters:  05/20/24 173 lb 12.8 oz (78.8 kg)  04/04/24 173 lb 9.6 oz (78.7 kg)  03/29/24 173 lb 9.6 oz (78.7 kg)     Past Medical History:  Diagnosis Date   CAD (coronary artery disease)    Bare meta stent RCA   Carotid stenosis    a. Carotid US  3/17: bilateral ICA 1-39% >> repeat 2 years   Coronary atherosclerosis of native coronary artery 08/24/2008   S/P PCI   Dementia 12/15/2021   concerns for Alzheimer's disease   Essential hypertension, benign 08/24/2008   Fatigue 08/08/2010   History of nuclear stress test    Myoview  12/16: EF 66%, very mild inferior ischemia; Low Risk   Mixed hyperlipidemia 08/24/2008   Myocardial infarction 10/06/2007   Nonproliferative diabetic retinopathy 08/08/2011   Nuclear sclerosis of both eyes 12/01/2017   Peripheral neuropathy    Pulmonary nodule, right 09/13/2007  Right upper lobe         Sinoatrial node dysfunction 08/24/2008   Chronotropic incompetence   Snoring 09/27/2009   Type II diabetes mellitus 08/08/2011   Past Surgical History:  Procedure Laterality Date   LEFT HEART CATHETERIZATION WITH CORONARY ANGIOGRAM N/A 03/04/2012   Procedure: LEFT HEART CATHETERIZATION WITH CORONARY ANGIOGRAM;  Surgeon: Lonni JONETTA Cash, MD;  Location: [facility] CATH LAB;  Service: Cardiovascular;  Laterality: N/A;   TONSILLECTOMY      Allergies   Allergen Reactions   Ace Inhibitors    Alpha Lipoic Acid [Lipoic Acid]     Upset Stomach   Invokana [Canagliflozin]    Heparin  Hives   Isosorbide  Mononitrate [Isosorbide  Dinitrate Er] Hives   Penicillins Hives   Sulfonamide Derivatives Hives    Outpatient Encounter Medications as of 05/20/2024  Medication Sig   amLODipine  (NORVASC ) 10 MG tablet TAKE 1 TABLET ONCE DAILY.   aspirin  81 MG tablet Take 1 tablet (81 mg total) by mouth daily.   citalopram (CELEXA) 20 MG tablet Take 20 mg by mouth daily.   cloNIDine (CATAPRES) 0.1 MG tablet Take 0.1 mg by mouth daily.   famotidine (PEPCID) 20 MG tablet Take 20 mg by mouth 2 (two) times daily.   feeding supplement, GLUCERNA SHAKE, (GLUCERNA SHAKE) LIQD Take 237 mLs by mouth at bedtime.   furosemide  (LASIX ) 20 MG tablet Take 1 tablet (20 mg total) by mouth 3 (three) times a week.   hydrALAZINE  (APRESOLINE ) 10 MG tablet Take 10 mg by mouth 3 (three) times daily as needed (for SBP >175).   hydrALAZINE  (APRESOLINE ) 25 MG tablet Take 50 mg by mouth 4 (four) times daily.   hydrOXYzine  (VISTARIL ) 25 MG capsule Take 1 capsule (25 mg total) by mouth 2 (two) times daily as needed (increased agitation/ sundowning).   insulin aspart (NOVOLOG) 100 UNIT/ML injection Inject 2 Units into the skin 4 (four) times daily -  before meals and at bedtime. 2 units > 151-200 4 units > 201-250 6 units > 251-300 8 units> 301-350 10 units> 351-400   insulin glargine (LANTUS) 100 UNIT/ML injection Inject 0.05 mLs (5 Units total) into the skin every morning.   Insulin Pen Needle (BD AUTOSHIELD DUO) 30G X 5 MM MISC 1 Needle by Does not apply route 3 (three) times daily.   latanoprost (XALATAN) 0.005 % ophthalmic solution Place 1 drop into both eyes at bedtime.   metoCLOPramide  (REGLAN ) 5 MG tablet Take 5 mg by mouth as directed. Orally before meals.   mirtazapine  (REMERON  SOL-TAB) 30 MG disintegrating tablet Take 1 tablet (30 mg total) by mouth at bedtime.   omeprazole   (PRILOSEC ) 40 MG capsule Take 40 mg by mouth daily before breakfast.   ondansetron  (ZOFRAN ) 4 MG tablet Take 4 mg by mouth as directed. Give 30 min prior to meals.   polyethylene glycol (MIRALAX ) 17 g packet Take 17 g by mouth daily.   potassium chloride  (KLOR-CON  M) 10 MEQ tablet Take 1 tablet (10 mEq total) by mouth 3 (three) times a week.   Suvorexant  (BELSOMRA ) 20 MG TABS Take 1 tablet (20 mg total) by mouth at bedtime.   atorvastatin (LIPITOR) 10 MG tablet Take 10 mg by mouth daily. (Patient not taking: Reported on 05/20/2024)   hydrOXYzine  (VISTARIL ) 25 MG capsule Take 50 mg by mouth at bedtime. (Patient not taking: Reported on 05/20/2024)   memantine  (NAMENDA ) 10 MG tablet TAKE ONE TABLET BY MOUTH EVERY MORNING and TAKE ONE TABLET BY MOUTH EVERYDAY AT BEDTIME (Patient  not taking: Reported on 05/20/2024)   potassium chloride  SA (KLOR-CON  M) 20 MEQ tablet Take 1 tablet (20 mEq total) by mouth daily for 3 days. (Patient not taking: Reported on 05/20/2024)   No facility-administered encounter medications on file as of 05/20/2024.    Review of Systems  Unable to perform ROS: Dementia    Immunization History  Administered Date(s) Administered   Fluad  Quad(high Dose 65+) 04/08/2022, 04/28/2023   Fluzone Influenza virus vaccine,trivalent (IIV3), split virus 03/11/2011, 05/03/2021   INFLUENZA, HIGH DOSE SEASONAL PF 03/29/2014, 04/02/2015, 03/31/2016, 04/08/2017, 04/16/2018   Influenza Split 05/01/2010, 04/06/2012, 04/21/2013   Influenza Whole 03/07/2009   Influenza-Unspecified 04/16/2019, 03/29/2020, 04/05/2024   Moderna Covid-19 Fall Seasonal Vaccine 13yrs & older 05/07/2022   Moderna Covid-19 Vaccine Bivalent Booster 72yrs & up 04/28/2023   Moderna Sars-Covid-2 Vaccination 07/19/2019, 08/17/2019, 06/01/2020, 12/09/2021   Pneumococcal Conjugate-13 03/29/2014   Pneumococcal Polysaccharide-23 10/31/2003, 09/07/2006, 03/25/2013   Td (Adult) 10/30/1993   Tdap 07/07/2010, 03/19/2011,  04/16/2023   Unspecified SARS-COV-2 Vaccination 04/05/2024   Zoster Recombinant(Shingrix) 10/24/2016, 12/24/2016   Zoster, Live 10/30/2005, 10/21/2016, 12/24/2016   Zoster, Unspecified 10/24/2016   Pertinent  Health Maintenance Due  Topic Date Due   OPHTHALMOLOGY EXAM  07/30/2023   FOOT EXAM  07/08/2024   HEMOGLOBIN A1C  08/10/2024   Influenza Vaccine  Completed      04/20/2023    4:07 PM 05/22/2023   10:57 AM 07/20/2023    1:17 PM 03/03/2024   10:24 AM 03/29/2024    1:22 PM  Fall Risk  Falls in the past year? 0 0 0 0 0  Was there an injury with Fall? 0 0 0 0 0  Fall Risk Category Calculator 0 0 0 0 0  Patient at Risk for Falls Due to No Fall Risks No Fall Risks No Fall Risks History of fall(s);Impaired mobility History of fall(s);Impaired mobility  Fall risk Follow up Falls evaluation completed Falls evaluation completed Falls evaluation completed Falls evaluation completed Falls evaluation completed   Functional Status Survey:    Vitals:   05/20/24 1025  BP: (!) 190/80  Pulse: 60  Resp: 17  Temp: (!) 97.4 F (36.3 C)  SpO2: 96%  Weight: 173 lb 12.8 oz (78.8 kg)  Height: 6' (1.829 m)   Body mass index is 23.57 kg/m. Physical Exam Vitals and nursing note reviewed.  Constitutional:      Appearance: Normal appearance.  HENT:     Head: Normocephalic and atraumatic.     Right Ear: Tympanic membrane normal.     Left Ear: Tympanic membrane normal.     Nose: Nose normal.     Mouth/Throat:     Mouth: Mucous membranes are moist.     Pharynx: Oropharynx is clear.  Eyes:     Conjunctiva/sclera: Conjunctivae normal.     Pupils: Pupils are equal, round, and reactive to light.  Cardiovascular:     Rate and Rhythm: Normal rate and regular rhythm.     Heart sounds: Murmur heard.  Pulmonary:     Effort: Pulmonary effort is normal. No respiratory distress.     Breath sounds: Normal breath sounds. No wheezing.  Abdominal:     General: Bowel sounds are normal. There is no  distension.     Palpations: Abdomen is soft.     Tenderness: There is no abdominal tenderness.  Musculoskeletal:     Cervical back: No rigidity.     Right lower leg: No edema.     Left lower leg: No  edema.  Lymphadenopathy:     Cervical: No cervical adenopathy.  Skin:    General: Skin is warm and dry.  Neurological:     General: No focal deficit present.     Mental Status: He is alert. Mental status is at baseline.     Comments: Unable to f/c Muscle atrophy   Psychiatric:        Mood and Affect: Mood normal.     Labs reviewed: Recent Labs    02/08/24 0000 03/07/24 0000 03/28/24 2335  NA 142 144 143  K 4.3 4.4 4.2  CL 105 107 107  CO2 23* 25* 23  GLUCOSE  --   --  120*  BUN 29* 33* 29*  CREATININE 1.7* 1.6* 1.44*  CALCIUM 8.9 8.9 9.2   Recent Labs    09/21/23 0000 03/28/24 2335  AST 32 29  ALT 29 15  ALKPHOS 125 132*  BILITOT  --  0.3  PROT  --  6.6  ALBUMIN 3.7 3.8   Recent Labs    09/21/23 0000 03/28/24 2335  WBC 5.2 5.2  NEUTROABS  --  3.0  HGB 12.1* 9.9*  HCT 36* 30.4*  MCV  --  89.4  PLT 252 314   Lab Results  Component Value Date   TSH 2.58 08/19/2022   Lab Results  Component Value Date   HGBA1C 7.4 02/08/2024   Lab Results  Component Value Date   CHOL 136 08/19/2022   HDL 37 08/19/2022   LDLCALC 80 08/19/2022   TRIG 96 08/19/2022   CHOLHDL 3.0 09/04/2007    Significant Diagnostic Results in last 30 days:  No results found.  Assessment/Plan  Type 2 diabetes with complication (HCC) Loose control due to hx of hypoglycemia and goals of care Continue lantus at 5 units and SSI  Essential hypertension D/c prn hydralazine  as he has meant the max dose and the q 8 prn order allows for frequent bp checks given his goals of care checking the bp so frequently is not warranted and can lead to over treatment and/or erroneous numbers Continue hydralazine  and norvasc , has allergy to ace and invokana Continue lasix  and kdur Would not  recommend BB due to HR and hx of LBBB/junctional rhythm on EKG Increase clonidine to BID Only check BP twice daily   Vomiting NO issues Continue reglan  and zofran  along with PPI  Hyperlipidemia Off statin  CAD (coronary artery disease) On asa  Diabetic neuropathy (HCC) No current issues with pain  Dementia Progressive decline in cognition and physical function c/w the disease. Continue supportive care in the skilled environment. Under hospice care Continue Remeron , Celexa,  CKD (chronic kidney disease), stage III (HCC) Continue to periodically monitor BMP and avoid nephrotoxic agents   Insomnia High dose but he had many issues with insomnia followed by Dr Tasia Continue Belsomra 

## 2024-05-21 ENCOUNTER — Encounter: Payer: Self-pay | Admitting: Adult Health

## 2024-05-21 NOTE — Assessment & Plan Note (Signed)
 High dose but he had many issues with insomnia followed by Dr Tasia Continue Belsomra 

## 2024-05-21 NOTE — Assessment & Plan Note (Signed)
 Progressive decline in cognition and physical function c/w the disease. Continue supportive care in the skilled environment. Under hospice care Continue Remeron , Celexa,

## 2024-05-21 NOTE — Assessment & Plan Note (Signed)
 Loose control due to hx of hypoglycemia and goals of care Continue lantus at 5 units and SSI

## 2024-05-21 NOTE — Assessment & Plan Note (Signed)
Off statin  

## 2024-05-21 NOTE — Assessment & Plan Note (Signed)
 NO issues Continue reglan  and zofran  along with PPI

## 2024-05-21 NOTE — Assessment & Plan Note (Signed)
No current issues with pain.

## 2024-05-21 NOTE — Assessment & Plan Note (Signed)
 On asa

## 2024-05-21 NOTE — Assessment & Plan Note (Addendum)
 D/c prn hydralazine  as he has meant the max dose and the q 8 prn order allows for frequent bp checks given his goals of care checking the bp so frequently is not warranted and can lead to over treatment and/or erroneous numbers Continue hydralazine  and norvasc , has allergy to ace and invokana Continue lasix  and kdur Would not recommend BB due to HR and hx of LBBB/junctional rhythm on EKG Increase clonidine to BID Only check BP twice daily

## 2024-05-21 NOTE — Assessment & Plan Note (Signed)
Continue to periodically monitor BMP and avoid nephrotoxic agents  

## 2024-07-12 ENCOUNTER — Other Ambulatory Visit: Payer: Self-pay

## 2024-07-12 ENCOUNTER — Emergency Department (HOSPITAL_COMMUNITY)

## 2024-07-12 ENCOUNTER — Encounter (HOSPITAL_COMMUNITY): Payer: Self-pay

## 2024-07-12 ENCOUNTER — Emergency Department (HOSPITAL_COMMUNITY)
Admission: EM | Admit: 2024-07-12 | Discharge: 2024-07-12 | Disposition: A | Discharge status: Home / Self Care | Attending: Emergency Medicine | Admitting: Emergency Medicine

## 2024-07-12 DIAGNOSIS — F0280 Dementia in other diseases classified elsewhere without behavioral disturbance: Secondary | ICD-10-CM | POA: Diagnosis not present

## 2024-07-12 DIAGNOSIS — I2510 Atherosclerotic heart disease of native coronary artery without angina pectoris: Secondary | ICD-10-CM | POA: Insufficient documentation

## 2024-07-12 DIAGNOSIS — E119 Type 2 diabetes mellitus without complications: Secondary | ICD-10-CM | POA: Diagnosis not present

## 2024-07-12 DIAGNOSIS — R7989 Other specified abnormal findings of blood chemistry: Secondary | ICD-10-CM | POA: Diagnosis not present

## 2024-07-12 DIAGNOSIS — G309 Alzheimer's disease, unspecified: Secondary | ICD-10-CM | POA: Insufficient documentation

## 2024-07-12 DIAGNOSIS — R251 Tremor, unspecified: Secondary | ICD-10-CM | POA: Diagnosis not present

## 2024-07-12 DIAGNOSIS — R079 Chest pain, unspecified: Secondary | ICD-10-CM | POA: Diagnosis present

## 2024-07-12 DIAGNOSIS — R531 Weakness: Secondary | ICD-10-CM | POA: Diagnosis not present

## 2024-07-12 DIAGNOSIS — Z7982 Long term (current) use of aspirin: Secondary | ICD-10-CM | POA: Insufficient documentation

## 2024-07-12 DIAGNOSIS — Z79899 Other long term (current) drug therapy: Secondary | ICD-10-CM | POA: Insufficient documentation

## 2024-07-12 DIAGNOSIS — Z794 Long term (current) use of insulin: Secondary | ICD-10-CM | POA: Diagnosis not present

## 2024-07-12 LAB — COMPREHENSIVE METABOLIC PANEL WITH GFR
ALT: 13 U/L (ref 0–44)
AST: 24 U/L (ref 15–41)
Albumin: 3.6 g/dL (ref 3.5–5.0)
Alkaline Phosphatase: 127 U/L — ABNORMAL HIGH (ref 38–126)
Anion gap: 11 (ref 5–15)
BUN: 32 mg/dL — ABNORMAL HIGH (ref 8–23)
CO2: 25 mmol/L (ref 22–32)
Calcium: 9 mg/dL (ref 8.9–10.3)
Chloride: 108 mmol/L (ref 98–111)
Creatinine, Ser: 1.59 mg/dL — ABNORMAL HIGH (ref 0.61–1.24)
GFR, Estimated: 42 mL/min — ABNORMAL LOW
Glucose, Bld: 161 mg/dL — ABNORMAL HIGH (ref 70–99)
Potassium: 4.2 mmol/L (ref 3.5–5.1)
Sodium: 144 mmol/L (ref 135–145)
Total Bilirubin: 0.5 mg/dL (ref 0.0–1.2)
Total Protein: 6.7 g/dL (ref 6.5–8.1)

## 2024-07-12 LAB — CBC
HCT: 33.8 % — ABNORMAL LOW (ref 39.0–52.0)
Hemoglobin: 10.6 g/dL — ABNORMAL LOW (ref 13.0–17.0)
MCH: 28.4 pg (ref 26.0–34.0)
MCHC: 31.4 g/dL (ref 30.0–36.0)
MCV: 90.6 fL (ref 80.0–100.0)
Platelets: 318 K/uL (ref 150–400)
RBC: 3.73 MIL/uL — ABNORMAL LOW (ref 4.22–5.81)
RDW: 15.2 % (ref 11.5–15.5)
WBC: 6.5 K/uL (ref 4.0–10.5)
nRBC: 0 % (ref 0.0–0.2)

## 2024-07-12 LAB — LIPASE, BLOOD: Lipase: 27 U/L (ref 11–51)

## 2024-07-12 LAB — TROPONIN T, HIGH SENSITIVITY
Troponin T High Sensitivity: 133 ng/L (ref 0–19)
Troponin T High Sensitivity: 120 ng/L (ref 0–19)

## 2024-07-12 LAB — AMMONIA: Ammonia: 22 umol/L (ref 9–35)

## 2024-07-12 NOTE — ED Triage Notes (Signed)
 89 yo pt BIB [facility] from [facility]. Pt is on hospice. This AM facility noted swelling to L side of the face and droop around 8:15 AM LKW. Pt c/o chest pain and was sent to ED. Daughter states he was clenching his chest.  Baseline speech issue, seems more lethargic than normal. Arrived with vomit on his shirt, hx of emesis. Has been alert and tracking for EMS. Pt on thickened diet, has not been taking meds today. Alzheimers and dementia hx. 12 lead showed left bundle branch block.   EMS vitals:   197/84 63 HR  94% RA 205 CBG

## 2024-07-12 NOTE — ED Provider Notes (Signed)
 " [place] EMERGENCY DEPARTMENT AT [facility] Provider Note   CSN: [id number] Arrival date & time: 07/12/24  1633     Patient presents with: Chest Pain and Fatigue   [name] is a 89 y.o. male.   HPI   Patient has a history of coronary artery disease neuropathy, carotid stenosis, dementia, diabetes, myocardial infarction.  According to previous record patient requires a Hoyer lift for transfers.  He is currently followed by hospice and palliative care.  Staff at the nursing facility felt like the patient may have had some drooping on the left side of his face this morning.  This was noted around 815.  He also had an episode of vomiting which apparently has not unusual for him.  Reportedly at some point patient complained of some chest pain and he was clutching his chest.  In the ED patient is alert and looking at me but not able to provide any history.  He is there is thank you but otherwise does not provide any history as to what was bothering him today  Prior to Admission medications  Medication Sig Start Date End Date Taking? Authorizing Provider  amLODipine  (NORVASC ) 10 MG tablet TAKE 1 TABLET ONCE DAILY. 08/03/17   Verlin Lonni BIRCH, MD  aspirin  81 MG tablet Take 1 tablet (81 mg total) by mouth daily. 09/15/12   Verlin Lonni BIRCH, MD  citalopram (CELEXA) 20 MG tablet Take 20 mg by mouth daily. 02/03/12   [provider]  cloNIDine (CATAPRES) 0.1 MG tablet Take 0.1 mg by mouth daily.    [provider]  famotidine (PEPCID) 20 MG tablet Take 20 mg by mouth 2 (two) times daily. 02/11/20   [provider]  feeding supplement, GLUCERNA SHAKE, (GLUCERNA SHAKE) LIQD Take 237 mLs by mouth at bedtime.    [provider]  furosemide  (LASIX ) 20 MG tablet Take 1 tablet (20 mg total) by mouth 3 (three) times a week. 12/04/23   Wert, Christina, NP  hydrALAZINE  (APRESOLINE ) 10 MG tablet Take 10 mg by mouth 3 (three) times daily as needed  (for SBP >175).    [provider]  hydrALAZINE  (APRESOLINE ) 25 MG tablet Take 50 mg by mouth 4 (four) times daily.    [provider]  hydrOXYzine  (VISTARIL ) 25 MG capsule Take 1 capsule (25 mg total) by mouth 2 (two) times daily as needed (increased agitation/ sundowning). 11/18/22   Fargo, Amy E, NP  insulin  aspart (NOVOLOG) 100 UNIT/ML injection Inject 2 Units into the skin 4 (four) times daily -  before meals and at bedtime. 2 units > 151-200 4 units > 201-250 6 units > 251-300 8 units> 301-350 10 units> 351-400    [provider]  insulin  glargine (LANTUS ) 100 UNIT/ML injection Inject 0.05 mLs (5 Units total) into the skin every morning. 04/05/24   Charlanne Fredia CROME, MD  Insulin  Pen Needle (BD AUTOSHIELD DUO) 30G X 5 MM MISC 1 Needle by Does not apply route 3 (three) times daily.    [provider]  latanoprost (XALATAN) 0.005 % ophthalmic solution Place 1 drop into both eyes at bedtime.    [provider]  metoCLOPramide  (REGLAN ) 5 MG tablet Take 5 mg by mouth as directed. Orally before meals.    [provider]  mirtazapine  (REMERON  SOL-TAB) 30 MG disintegrating tablet Take 1 tablet (30 mg total) by mouth at bedtime. 02/05/24   Darlean Maus, NP  omeprazole  (PRILOSEC ) 40 MG capsule Take 40 mg by  mouth daily before breakfast.    [provider]  ondansetron  (ZOFRAN ) 4 MG tablet Take 4 mg by mouth as directed. Give 30 min prior to meals.    [provider]  polyethylene glycol (MIRALAX ) 17 g packet Take 17 g by mouth daily. 09/03/23   Darlean Maus, NP  potassium chloride  (KLOR-CON  M) 10 MEQ tablet Take 1 tablet (10 mEq total) by mouth 3 (three) times a week. 12/04/23   Darlean Maus, NP  Suvorexant  (BELSOMRA ) 20 MG TABS Take 1 tablet (20 mg total) by mouth at bedtime. 04/19/24   Charlanne Fredia CROME, MD    Allergies: Ace inhibitors, Alpha lipoic acid [lipoic acid], Invokana [canagliflozin], Heparin , Isosorbide  mononitrate  [isosorbide  dinitrate er], Penicillins, and Sulfonamide derivatives    Review of Systems  Updated Vital Signs BP (!) 191/65   Pulse 67   Temp 98.6 F (37 C) (Oral)   Resp 10   Ht 1.829 m (6')   Wt 77.1 kg   SpO2 99%   BMI 23.06 kg/m   Physical Exam Vitals and nursing note reviewed.  Constitutional:      Appearance: He is well-developed. He is not diaphoretic.  HENT:     Head: Normocephalic and atraumatic.     Right Ear: External ear normal.     Left Ear: External ear normal.  Eyes:     General: No scleral icterus.       Right eye: No discharge.        Left eye: No discharge.     Conjunctiva/sclera: Conjunctivae normal.  Neck:     Trachea: No tracheal deviation.  Cardiovascular:     Rate and Rhythm: Normal rate and regular rhythm.  Pulmonary:     Effort: Pulmonary effort is normal. No respiratory distress.     Breath sounds: Normal breath sounds. No stridor. No wheezing or rales.  Abdominal:     General: Bowel sounds are normal. There is no distension.     Palpations: Abdomen is soft.     Tenderness: There is no abdominal tenderness. There is no guarding or rebound.  Musculoskeletal:        General: No tenderness or deformity.     Cervical back: Neck supple.  Skin:    General: Skin is warm and dry.     Findings: No rash.  Neurological:     Mental Status: He is alert.     Cranial Nerves: No cranial nerve deficit, dysarthria or facial asymmetry.     Sensory: No sensory deficit.     Motor: Weakness and tremor present. No seizure activity.     Comments: Patient does move both of his hands during exam but overall appears weak, does not lift his legs off the bed, speech is limited but no dysarthria noted  Psychiatric:        Mood and Affect: Mood normal.     (all labs ordered are listed, but only abnormal results are displayed) Labs Reviewed  CBC - Abnormal; Notable for the following components:      Result Value   RBC 3.73 (*)    Hemoglobin 10.6 (*)    HCT  33.8 (*)    All other components within normal limits  COMPREHENSIVE METABOLIC PANEL WITH GFR - Abnormal; Notable for the following components:   Glucose, Bld 161 (*)    BUN 32 (*)    Creatinine, Ser 1.59 (*)    Alkaline Phosphatase 127 (*)    GFR, Estimated 42 (*)  All other components within normal limits  TROPONIN T, HIGH SENSITIVITY - Abnormal; Notable for the following components:   Troponin T High Sensitivity 133 (*)    All other components within normal limits  TROPONIN T, HIGH SENSITIVITY - Abnormal; Notable for the following components:   Troponin T High Sensitivity 120 (*)    All other components within normal limits  LIPASE, BLOOD  AMMONIA  URINALYSIS, ROUTINE W REFLEX MICROSCOPIC  AMMONIA    EKG: EKG Interpretation Date/Time:  Tuesday July 12 2024 17:03:19 EST Ventricular Rate:  69 PR Interval:    QRS Duration:  128 QT Interval:  433 QTC Calculation: 464 R Axis:   -65  Text Interpretation: pooor data quality, undetermined rhythm Left bundle branch block Confirmed by Randol Simmonds [id number]) on 07/12/2024 5:11:35 PM  Radiology: CT Head Wo Contrast Result Date: 07/12/2024 CLINICAL DATA:  Altered mental status. EXAM: CT HEAD WITHOUT CONTRAST TECHNIQUE: Contiguous axial images were obtained from the base of the skull through the vertex without intravenous contrast. RADIATION DOSE REDUCTION: This exam was performed according to the departmental dose-optimization program which includes automated exposure control, adjustment of the mA and/or kV according to patient size and/or use of iterative reconstruction technique. COMPARISON:  March 29, 2024 FINDINGS: Brain: There is generalized cerebral atrophy with widening of the extra-axial spaces and ventricular dilatation. There are areas of decreased attenuation within the white matter tracts of the supratentorial brain, consistent with microvascular disease changes. Vascular: No hyperdense vessel or unexpected calcification.  Skull: Normal. Negative for fracture or focal lesion. Sinuses/Orbits: No acute finding. Other: None. IMPRESSION: 1. Generalized cerebral atrophy and microvascular disease changes of the supratentorial brain. 2. No acute intracranial abnormality. Electronically Signed   By: Suzen Dials M.D.   On: 07/12/2024 18:49   DG Chest Portable 1 View Result Date: 07/12/2024 CLINICAL DATA:  Chest pain.  Alzheimer's. EXAM: PORTABLE CHEST - 1 VIEW COMPARISON:  07/11/2021 FINDINGS: Cardiomediastinal silhouette and pulmonary vasculature are within normal limits. LEFT lung apex obscured by patient's chin. Visualized portion of the lungs are clear. IMPRESSION: No acute cardiopulmonary process. Exam limited due to patient condition. Electronically Signed   By: Aliene Lloyd M.D.   On: 07/12/2024 17:17     Procedures   Medications Ordered in the ED - No data to display  Clinical Course as of 07/12/24 2035  Tue Jul 12, 2024  1831 Troponin T, High Sensitivity(!!) Increased at 133.  CBC shows hemoglobin of 10.6, similar to previous [JK]  1831 Comprehensive metabolic panel(!) Creatinine slightly increased compared to previous [JK]  1831 DG Chest Portable 1 View No acute findings [JK]  1903 CT Head Wo Contrast Head CT without acute abnormality [JK]    Clinical Course User Index [JK] Randol Simmonds, MD                                 Medical Decision Making Problems Addressed: Chest pain, unspecified type: acute illness or injury that poses a threat to life or bodily functions  Amount and/or Complexity of Data Reviewed Labs: ordered. Decision-making details documented in ED Course. Radiology: ordered and independent interpretation performed. Decision-making details documented in ED Course.  Risk Decision not to resuscitate or to de-escalate care because of poor prognosis.   Patient presented to the ED for evaluation of an episode of possible facial drooping as well as chest discomfort.  Daughters are at  the bedside and confirmed that patient  is on hospice.  They are not interested in aggressive interventions.  They were concerned primarily because he seemed to be having pain earlier today.  Patient has remained comfortable in the ED and at his baseline.  He does not appear to be in any pain or discomfort.  His ED workup did show elevated troponins but the delta is decreasing.  At slight only slightly higher than previous elevated troponins.  His head CT does not show any obvious acute abnormality.  Patient does not have any signs of acute anemia or electrolyte disturbance.  I discussed the findings with the patient's daughters.  Certainly cannot exclude the possibility of stroke.  An episode of angina and non-ST elevation MI is not completely excluded although I am reassured by the downtrending troponin.  They are not interested in admission to the hospital for further evaluation.  They are comfortable with discharge back to the nursing facility for continued hospice palliative type care.     Final diagnoses:  Chest pain, unspecified type    ED Discharge Orders     None          Randol Simmonds, MD 07/12/24 2037  "

## 2024-07-12 NOTE — Progress Notes (Signed)
 [facility] liaison note     This patient is a current hospice patient with [facility]. Please see media tab for detailed hospice report.    Liaison will continue to follow for any discharge planning needs and to coordinate continuation of hospice care.    Please don't hesitate to call with any Hospice related questions or concerns.    Thank you for the opportunity to participate in this patient's care.   Eleanor Nail, LPN [facility] Liaison [phone]

## 2024-07-12 NOTE — ED Notes (Signed)
 Need to recollect ammonia level with lav on ice

## 2024-07-12 NOTE — ED Notes (Signed)
 Pt to ct

## 2024-07-12 NOTE — ED Notes (Signed)
 Pt's family declining urinary cath. MD aware.

## 2024-07-12 NOTE — ED Notes (Signed)
Secretary to call [facility] for transport back to facility  °

## 2024-07-12 NOTE — ED Notes (Addendum)
 Critical trop 133 ,knapp MD aware

## 2024-07-12 NOTE — ED Notes (Signed)
 Pillow from home found in pt's room. Called emergency contact Earnie and she stated OK to dispose.

## 2024-07-12 NOTE — Discharge Instructions (Signed)
 Continue your home medications.  Continue treatment with the palliative care team.  Return to the emergency room as needed

## 2024-08-05 ENCOUNTER — Encounter: Payer: Self-pay | Admitting: Adult Health

## 2024-08-05 ENCOUNTER — Non-Acute Institutional Stay (SKILLED_NURSING_FACILITY): Payer: Self-pay | Admitting: Adult Health

## 2024-08-05 DIAGNOSIS — G44209 Tension-type headache, unspecified, not intractable: Secondary | ICD-10-CM

## 2024-08-05 MED ORDER — TRAMADOL HCL 50 MG PO TABS: 50.0000 mg | ORAL_TABLET | Freq: Three times a day (TID) | ORAL | 0 refills | Status: AC | PRN

## 2024-08-05 MED ORDER — IBUPROFEN 200 MG PO TABS: 400.0000 mg | ORAL_TABLET | Freq: Three times a day (TID) | ORAL | 0 refills | Status: AC | PRN

## 2024-08-05 NOTE — Progress Notes (Signed)
 " Location:  [facility] Room Number: 150 P Place of Service:  SNF (31) Provider: Tawni America, NP   Patient Care Team: Charlanne Fredia CROME, MD as PCP - General (Internal Medicine) Verlin Lonni BIRCH, MD as PCP - Cardiology (Cardiology) Skeet Juliene SAUNDERS, DO as Consulting Physician (Neurology)  Extended Emergency Contact Information Primary Emergency Contact: [facility] Address: [address]          [place], [place] [address] [place] Home Phone: [phone] Mobile Phone: [phone] Relation: Daughter Secondary Emergency Contact: [facility] Phone: [phone] Relation: Spouse Preferred language: English Interpreter needed? No  Code Status:  DNR Goals of care: Advanced Directive information    07/12/2024    4:48 PM  Advanced Directives  Does Patient Have a Medical Advance Directive? Yes  Type of Advance Directive Out of facility DNR (pink MOST or yellow form)  Pre-existing out of facility DNR order (yellow form or pink MOST form) Pink Most/Yellow Form available - Physician notified to receive inpatient order     Chief Complaint  Patient presents with   Routine Visit    HPI:  Pt is a 89 y.o. male seen today for a headache  The nurse reports this morning he reported a frontal headache. He has tried tylenol  with no relief.   Followed by hospice due to dementia.   CBG 174 BP 159/78 Eating and drinking No focal deficit No hx of headaches  He has dementia and can not provide a hx. Not talking today but takes your hand and puts it on his forehead when asking if he is in pain.   Seen in the ED for chest pain and left facial droop 07/12/24 CT of the head showed generalized cerebral atrophy and microvascular disease changes nothing acute Troponin was slightly elevated but the delta was decreasing. They did not completely exclude a CVA or MI but given his goals of care he was discharged with his care focus on  pain management.    Past Medical History:  Diagnosis Date   CAD (coronary artery disease)    Bare meta stent RCA   Carotid stenosis    a. Carotid US  3/17: bilateral ICA 1-39% >> repeat 2 years   Coronary atherosclerosis of native coronary artery 08/24/2008   S/P PCI   Dementia 12/15/2021   concerns for Alzheimer's disease   Essential hypertension, benign 08/24/2008   Fatigue 08/08/2010   History of nuclear stress test    Myoview  12/16: EF 66%, very mild inferior ischemia; Low Risk   Mixed hyperlipidemia 08/24/2008   Myocardial infarction 10/06/2007   Nonproliferative diabetic retinopathy 08/08/2011   Nuclear sclerosis of both eyes 12/01/2017   Peripheral neuropathy    Pulmonary nodule, right 09/13/2007   Right upper lobe         Sinoatrial node dysfunction 08/24/2008   Chronotropic incompetence   Snoring 09/27/2009   Type II diabetes mellitus 08/08/2011   Past Surgical History:  Procedure Laterality Date   LEFT HEART CATHETERIZATION WITH CORONARY ANGIOGRAM N/A 03/04/2012   Procedure: LEFT HEART CATHETERIZATION WITH CORONARY ANGIOGRAM;  Surgeon: Lonni BIRCH Verlin, MD;  Location: [facility] CATH LAB;  Service: Cardiovascular;  Laterality: N/A;   TONSILLECTOMY      Allergies[1]  Outpatient Encounter Medications as of 08/05/2024  Medication Sig   acetaminophen  (TYLENOL ) 650 MG CR tablet Take 650 mg by mouth every 4 (four) hours as needed for pain. Give 650 mg by mouth every 4 hours as needed for minor discomfort for  3 Days administer q 4 hours prn for 3 days. Do not exceed 4000mg  in 24 hours. Note area of pain and effectiveness.   amLODipine  (NORVASC ) 10 MG tablet TAKE 1 TABLET ONCE DAILY.   aspirin  81 MG tablet Take 1 tablet (81 mg total) by mouth daily.   citalopram (CELEXA) 20 MG tablet Take 20 mg by mouth daily.   cloNIDine (CATAPRES) 0.1 MG tablet Take 0.1 mg by mouth 2 (two) times daily. Give 0.1 mg by mouth two times a day related to ESSENTIAL (PRIMARY) HYPERTENSION  (I10)   famotidine (PEPCID) 20 MG tablet Take 20 mg by mouth 2 (two) times daily.   furosemide  (LASIX ) 20 MG tablet Take 1 tablet (20 mg total) by mouth 3 (three) times a week.   hydrALAZINE  (APRESOLINE ) 25 MG tablet Take 50 mg by mouth 4 (four) times daily.   insulin  aspart (NOVOLOG) 100 UNIT/ML injection Inject 2 Units into the skin 4 (four) times daily -  before meals and at bedtime. 2 units > 151-200 4 units > 201-250 6 units > 251-300 8 units> 301-350 10 units> 351-400   insulin  glargine (LANTUS ) 100 UNIT/ML injection Inject 0.05 mLs (5 Units total) into the skin every morning.   Insulin  Pen Needle (BD AUTOSHIELD DUO) 30G X 5 MM MISC 1 Needle by Does not apply route 3 (three) times daily.   latanoprost (XALATAN) 0.005 % ophthalmic solution Place 1 drop into both eyes at bedtime.   metoCLOPramide  (REGLAN ) 5 MG tablet Take 5 mg by mouth as directed. Orally before meals.   mirtazapine  (REMERON  SOL-TAB) 30 MG disintegrating tablet Take 1 tablet (30 mg total) by mouth at bedtime.   omeprazole  (PRILOSEC ) 40 MG capsule Take 40 mg by mouth daily before breakfast.   ondansetron  (ZOFRAN ) 4 MG tablet Take 4 mg by mouth as directed. Give 30 min prior to meals.   polyethylene glycol (MIRALAX ) 17 g packet Take 17 g by mouth daily.   potassium chloride  (KLOR-CON  M) 10 MEQ tablet Take 1 tablet (10 mEq total) by mouth 3 (three) times a week.   promethazine (PHENERGAN) 25 MG suppository Place 25 mg rectally every 6 (six) hours as needed for vomiting or nausea.   Suvorexant  (BELSOMRA ) 20 MG TABS Take 1 tablet (20 mg total) by mouth at bedtime.   feeding supplement, GLUCERNA SHAKE, (GLUCERNA SHAKE) LIQD Take 237 mLs by mouth at bedtime.   hydrALAZINE  (APRESOLINE ) 10 MG tablet Take 10 mg by mouth 3 (three) times daily as needed (for SBP >175). (Patient not taking: Reported on 08/05/2024)   hydrOXYzine  (VISTARIL ) 25 MG capsule Take 1 capsule (25 mg total) by mouth 2 (two) times daily as needed (increased  agitation/ sundowning). (Patient not taking: Reported on 08/05/2024)   No facility-administered encounter medications on file as of 08/05/2024.    Review of Systems  Unable to perform ROS: Dementia    Immunization History  Administered Date(s) Administered   Fluad  Quad(high Dose 65+) 04/08/2022, 04/28/2023   Fluzone Influenza virus vaccine,trivalent (IIV3), split virus 03/11/2011, 05/03/2021   INFLUENZA, HIGH DOSE SEASONAL PF 03/29/2014, 04/02/2015, 03/31/2016, 04/08/2017, 04/16/2018   Influenza Split 05/01/2010, 04/06/2012, 04/21/2013   Influenza Whole 03/07/2009   Influenza-Unspecified 04/16/2019, 03/29/2020, 04/05/2024   Moderna Covid-19 Fall Seasonal Vaccine 4yrs & older 05/07/2022   Moderna Covid-19 Vaccine Bivalent Booster 47yrs & up 04/28/2023   Moderna Sars-Covid-2 Vaccination 07/19/2019, 08/17/2019, 06/01/2020, 12/09/2021   Pneumococcal Conjugate-13 03/29/2014   Pneumococcal Polysaccharide-23 10/31/2003, 09/07/2006, 03/25/2013   Td (Adult) 10/30/1993   Tdap  07/07/2010, 03/19/2011, 04/16/2023   Unspecified SARS-COV-2 Vaccination 04/05/2024   Zoster Recombinant(Shingrix) 10/24/2016, 12/24/2016   Zoster, Live 10/30/2005, 10/21/2016, 12/24/2016   Zoster, Unspecified 10/24/2016   Pertinent  Health Maintenance Due  Topic Date Due   OPHTHALMOLOGY EXAM  07/30/2023   FOOT EXAM  07/08/2024   HEMOGLOBIN A1C  08/10/2024   Influenza Vaccine  Completed      04/20/2023    4:07 PM 05/22/2023   10:57 AM 07/20/2023    1:17 PM 03/03/2024   10:24 AM 03/29/2024    1:22 PM  Fall Risk  Falls in the past year? 0 0 0 0 0  Was there an injury with Fall? 0  0  0  0  0   Fall Risk Category Calculator 0 0 0 0 0  Patient at Risk for Falls Due to No Fall Risks No Fall Risks No Fall Risks History of fall(s);Impaired mobility History of fall(s);Impaired mobility  Fall risk Follow up Falls evaluation completed Falls evaluation completed Falls evaluation completed Falls evaluation completed Falls  evaluation completed     Data saved with a previous flowsheet row definition   Functional Status Survey:    Vitals:   08/05/24 0948  BP: (!) 159/78  Pulse: (!) 55  Resp: 18  Temp: (!) 97.2 F (36.2 C)  SpO2: 92%  Weight: 170 lb (77.1 kg)  Height: 6' (1.829 m)   Body mass index is 23.06 kg/m. Physical Exam Vitals and nursing note reviewed.  Constitutional:      Appearance: Normal appearance.  HENT:     Head: Normocephalic and atraumatic.  Eyes:     Conjunctiva/sclera: Conjunctivae normal.     Pupils: Pupils are equal, round, and reactive to light.  Cardiovascular:     Rate and Rhythm: Normal rate and regular rhythm.     Heart sounds: No murmur heard. Pulmonary:     Effort: Pulmonary effort is normal. No respiratory distress.     Breath sounds: Normal breath sounds. No wheezing.  Musculoskeletal:     Cervical back: No rigidity.     Right lower leg: No edema.     Left lower leg: No edema.  Lymphadenopathy:     Cervical: No cervical adenopathy.  Skin:    General: Skin is warm and dry.  Neurological:     General: No focal deficit present.     Mental Status: He is alert. Mental status is at baseline.     Comments: Grip strength is weak but equal. No obvious droop.  Not able to f/c for full exam.  Alert with eyes closed     Labs reviewed: Recent Labs    03/07/24 0000 03/28/24 2335 07/12/24 1703  NA 144 143 144  K 4.4 4.2 4.2  CL 107 107 108  CO2 25* 23 25  GLUCOSE  --  120* 161*  BUN 33* 29* 32*  CREATININE 1.6* 1.44* 1.59*  CALCIUM 8.9 9.2 9.0   Recent Labs    09/21/23 0000 03/28/24 2335 07/12/24 1703  AST 32 29 24  ALT 29 15 13   ALKPHOS 125 132* 127*  BILITOT  --  0.3 0.5  PROT  --  6.6 6.7  ALBUMIN 3.7 3.8 3.6   Recent Labs    09/21/23 0000 03/28/24 2335 07/12/24 1703  WBC 5.2 5.2 6.5  NEUTROABS  --  3.0  --   HGB 12.1* 9.9* 10.6*  HCT 36* 30.4* 33.8*  MCV  --  89.4 90.6  PLT 252 314 318  Lab Results  Component Value Date   TSH  2.58 08/19/2022   Lab Results  Component Value Date   HGBA1C 7.4 02/08/2024   Lab Results  Component Value Date   CHOL 136 08/19/2022   HDL 37 08/19/2022   LDLCALC 80 08/19/2022   TRIG 96 08/19/2022   CHOLHDL 3.0 09/04/2007    Significant Diagnostic Results in last 30 days:  CT Head Wo Contrast Result Date: 07/12/2024 CLINICAL DATA:  Altered mental status. EXAM: CT HEAD WITHOUT CONTRAST TECHNIQUE: Contiguous axial images were obtained from the base of the skull through the vertex without intravenous contrast. RADIATION DOSE REDUCTION: This exam was performed according to the departmental dose-optimization program which includes automated exposure control, adjustment of the mA and/or kV according to patient size and/or use of iterative reconstruction technique. COMPARISON:  March 29, 2024 FINDINGS: Brain: There is generalized cerebral atrophy with widening of the extra-axial spaces and ventricular dilatation. There are areas of decreased attenuation within the white matter tracts of the supratentorial brain, consistent with microvascular disease changes. Vascular: No hyperdense vessel or unexpected calcification. Skull: Normal. Negative for fracture or focal lesion. Sinuses/Orbits: No acute finding. Other: None. IMPRESSION: 1. Generalized cerebral atrophy and microvascular disease changes of the supratentorial brain. 2. No acute intracranial abnormality. Electronically Signed   By: Suzen Dials M.D.   On: 07/12/2024 18:49   DG Chest Portable 1 View Result Date: 07/12/2024 CLINICAL DATA:  Chest pain.  Alzheimer's. EXAM: PORTABLE CHEST - 1 VIEW COMPARISON:  07/11/2021 FINDINGS: Cardiomediastinal silhouette and pulmonary vasculature are within normal limits. LEFT lung apex obscured by patient's chin. Visualized portion of the lungs are clear. IMPRESSION: No acute cardiopulmonary process. Exam limited due to patient condition. Electronically Signed   By: Aliene Lloyd M.D.   On: 07/12/2024 17:17     Assessment/Plan  1. Acute non intractable tension-type headache (Primary) No obvious focal deficit but he is not able to follow commands for a full exam. PEERLA CBG and BP are within his normal and not likely contributing to his symptoms.  Will try tramadol  and advil  together for pain  Goals of care are comfort based only as confirmed at bedside with his daughter Hospice will follow up later today.  - traMADol  (ULTRAM ) 50 MG tablet; Take 1 tablet (50 mg total) by mouth every 8 (eight) hours as needed for up to 10 days.  Dispense: 30 tablet; Refill: 0 - ibuprofen  (ADVIL ) 200 MG tablet; Take 2 tablets (400 mg total) by mouth every 8 (eight) hours as needed.  Dispense: 30 tablet; Refill: 0   Family/ staff Communication: discussed with his daughter at bedside.          [1]  Allergies Allergen Reactions   Ace Inhibitors    Alpha Lipoic Acid [Lipoic Acid]     Upset Stomach   Invokana [Canagliflozin]    Heparin  Hives   Isosorbide  Mononitrate [Isosorbide  Dinitrate Er] Hives   Penicillins Hives   Sulfonamide Derivatives Hives   "
